# Patient Record
Sex: Female | Born: 1945
Health system: Southern US, Community
[De-identification: ages and names within clinical notes are randomized; demographics above are authoritative.]

## PROBLEM LIST (undated history)

## (undated) DIAGNOSIS — E785 Hyperlipidemia, unspecified: Secondary | ICD-10-CM

## (undated) DIAGNOSIS — H269 Unspecified cataract: Secondary | ICD-10-CM

## (undated) DIAGNOSIS — B029 Zoster without complications: Secondary | ICD-10-CM

## (undated) DIAGNOSIS — K219 Gastro-esophageal reflux disease without esophagitis: Secondary | ICD-10-CM

## (undated) DIAGNOSIS — C50919 Malignant neoplasm of unspecified site of unspecified female breast: Secondary | ICD-10-CM

## (undated) HISTORY — PX: WRIST FRACTURE SURGERY: SHX121

## (undated) HISTORY — DX: Unspecified cataract: H26.9

## (undated) HISTORY — PX: CATARACT EXTRACTION: SUR2

## (undated) HISTORY — PX: ABDOMINAL HYSTERECTOMY: SHX81

## (undated) HISTORY — DX: Hyperlipidemia, unspecified: E78.5

## (undated) HISTORY — DX: Malignant neoplasm of unspecified site of unspecified female breast: C50.919

## (undated) HISTORY — DX: Zoster without complications: B02.9

## (undated) HISTORY — DX: Gastro-esophageal reflux disease without esophagitis: K21.9

---

## 1979-07-24 HISTORY — PX: PARTIAL HYSTERECTOMY: SHX80

## 1999-09-09 ENCOUNTER — Other Ambulatory Visit: Admission: RE | Admit: 1999-09-09 | Discharge: 1999-09-09 | Payer: Self-pay | Admitting: Obstetrics and Gynecology

## 2000-12-06 ENCOUNTER — Other Ambulatory Visit: Admission: RE | Admit: 2000-12-06 | Discharge: 2000-12-06 | Payer: Self-pay | Admitting: Obstetrics and Gynecology

## 2002-01-03 ENCOUNTER — Other Ambulatory Visit: Admission: RE | Admit: 2002-01-03 | Discharge: 2002-01-03 | Payer: Self-pay | Admitting: Internal Medicine

## 2002-01-20 ENCOUNTER — Encounter: Payer: Self-pay | Admitting: Family Medicine

## 2002-01-20 LAB — CONVERTED CEMR LAB

## 2002-01-30 ENCOUNTER — Ambulatory Visit (HOSPITAL_COMMUNITY): Admission: RE | Admit: 2002-01-30 | Discharge: 2002-01-30 | Payer: Self-pay | Admitting: Obstetrics and Gynecology

## 2004-12-16 ENCOUNTER — Ambulatory Visit: Payer: Self-pay | Admitting: Family Medicine

## 2005-02-08 ENCOUNTER — Ambulatory Visit: Payer: Self-pay | Admitting: Internal Medicine

## 2005-04-26 ENCOUNTER — Ambulatory Visit: Payer: Self-pay | Admitting: Family Medicine

## 2005-05-11 ENCOUNTER — Ambulatory Visit: Payer: Self-pay | Admitting: Family Medicine

## 2005-06-08 ENCOUNTER — Ambulatory Visit: Payer: Self-pay | Admitting: Internal Medicine

## 2005-07-01 ENCOUNTER — Ambulatory Visit: Payer: Self-pay | Admitting: Family Medicine

## 2006-09-22 LAB — CONVERTED CEMR LAB: Pap Smear: NORMAL

## 2007-07-07 ENCOUNTER — Encounter: Payer: Self-pay | Admitting: Family Medicine

## 2007-07-07 DIAGNOSIS — E785 Hyperlipidemia, unspecified: Secondary | ICD-10-CM | POA: Insufficient documentation

## 2007-07-07 DIAGNOSIS — K219 Gastro-esophageal reflux disease without esophagitis: Secondary | ICD-10-CM | POA: Insufficient documentation

## 2007-07-07 DIAGNOSIS — K760 Fatty (change of) liver, not elsewhere classified: Secondary | ICD-10-CM | POA: Insufficient documentation

## 2007-07-07 DIAGNOSIS — M810 Age-related osteoporosis without current pathological fracture: Secondary | ICD-10-CM | POA: Insufficient documentation

## 2007-08-02 ENCOUNTER — Ambulatory Visit: Payer: Self-pay | Admitting: Family Medicine

## 2007-08-15 ENCOUNTER — Ambulatory Visit: Payer: Self-pay | Admitting: Family Medicine

## 2007-08-16 LAB — CONVERTED CEMR LAB
ALT: 26 units/L (ref 0–35)
AST: 21 units/L (ref 0–37)
Albumin: 4.2 g/dL (ref 3.5–5.2)
Alkaline Phosphatase: 61 units/L (ref 39–117)
BUN: 14 mg/dL (ref 6–23)
Basophils Absolute: 0 10*3/uL (ref 0.0–0.1)
Basophils Relative: 0.1 % (ref 0.0–1.0)
Bilirubin, Direct: 0.2 mg/dL (ref 0.0–0.3)
CO2: 29 meq/L (ref 19–32)
Calcium: 9.9 mg/dL (ref 8.4–10.5)
Chloride: 106 meq/L (ref 96–112)
Cholesterol: 269 mg/dL (ref 0–200)
Creatinine, Ser: 1 mg/dL (ref 0.4–1.2)
Direct LDL: 198.6 mg/dL
Eosinophils Absolute: 0.2 10*3/uL (ref 0.0–0.6)
Eosinophils Relative: 2.3 % (ref 0.0–5.0)
GFR calc Af Amer: 72 mL/min
GFR calc non Af Amer: 60 mL/min
Glucose, Bld: 96 mg/dL (ref 70–99)
HCT: 38.2 % (ref 36.0–46.0)
HDL: 50.7 mg/dL (ref >39.0)
Hemoglobin: 13 g/dL (ref 12.0–15.0)
Lymphocytes Relative: 32.8 % (ref 12.0–46.0)
MCHC: 34 g/dL (ref 30.0–36.0)
MCV: 84.5 fL (ref 78.0–100.0)
Monocytes Absolute: 0.6 10*3/uL (ref 0.2–0.7)
Monocytes Relative: 9 % (ref 3.0–11.0)
Neutro Abs: 3.8 10*3/uL (ref 1.4–7.7)
Neutrophils Relative %: 55.8 % (ref 43.0–77.0)
Platelets: 305 10*3/uL (ref 150–400)
Potassium: 5 meq/L (ref 3.5–5.1)
RBC: 4.52 M/uL (ref 3.87–5.11)
RDW: 12.6 % (ref 11.5–14.6)
Sodium: 141 meq/L (ref 135–145)
TSH: 2.98 microintl units/mL (ref 0.35–5.50)
Total Bilirubin: 0.8 mg/dL (ref 0.3–1.2)
Total CHOL/HDL Ratio: 5.3
Total Protein: 6.5 g/dL (ref 6.0–8.3)
Triglycerides: 156 mg/dL — ABNORMAL HIGH (ref 0–149)
VLDL: 31 mg/dL (ref 0–40)
WBC: 6.9 10*3/uL (ref 4.5–10.5)

## 2007-08-28 ENCOUNTER — Ambulatory Visit: Payer: Self-pay | Admitting: Family Medicine

## 2007-08-29 ENCOUNTER — Encounter (INDEPENDENT_AMBULATORY_CARE_PROVIDER_SITE_OTHER): Payer: Self-pay | Admitting: *Deleted

## 2007-09-26 ENCOUNTER — Ambulatory Visit: Payer: Self-pay | Admitting: Family Medicine

## 2007-09-27 LAB — CONVERTED CEMR LAB
ALT: 28 units/L (ref 0–35)
AST: 24 units/L (ref 0–37)
Cholesterol: 193 mg/dL (ref 0–200)
HDL: 42.7 mg/dL (ref >39.0)
LDL Cholesterol: 120 mg/dL — ABNORMAL HIGH (ref 0–99)
Total CHOL/HDL Ratio: 4.5
Triglycerides: 151 mg/dL — ABNORMAL HIGH (ref 0–149)
VLDL: 30 mg/dL (ref 0–40)

## 2008-07-19 ENCOUNTER — Encounter: Payer: Self-pay | Admitting: Family Medicine

## 2008-07-23 ENCOUNTER — Encounter (INDEPENDENT_AMBULATORY_CARE_PROVIDER_SITE_OTHER): Payer: Self-pay | Admitting: *Deleted

## 2008-10-02 ENCOUNTER — Ambulatory Visit: Payer: Self-pay | Admitting: Family Medicine

## 2008-10-03 LAB — CONVERTED CEMR LAB
ALT: 27 units/L (ref 0–35)
AST: 28 units/L (ref 0–37)
Albumin: 3.7 g/dL (ref 3.5–5.2)
Alkaline Phosphatase: 38 units/L — ABNORMAL LOW (ref 39–117)
BUN: 10 mg/dL (ref 6–23)
Basophils Absolute: 0 10*3/uL (ref 0.0–0.1)
Basophils Relative: 0.1 % (ref 0.0–3.0)
Bilirubin, Direct: 0.1 mg/dL (ref 0.0–0.3)
CO2: 27 meq/L (ref 19–32)
Calcium: 8.8 mg/dL (ref 8.4–10.5)
Chloride: 109 meq/L (ref 96–112)
Cholesterol: 173 mg/dL (ref 0–200)
Creatinine, Ser: 1.1 mg/dL (ref 0.4–1.2)
Eosinophils Absolute: 0.1 10*3/uL (ref 0.0–0.7)
Eosinophils Relative: 1.5 % (ref 0.0–5.0)
GFR calc Af Amer: 64 mL/min
GFR calc non Af Amer: 53 mL/min
Glucose, Bld: 100 mg/dL — ABNORMAL HIGH (ref 70–99)
HCT: 33 % — ABNORMAL LOW (ref 36.0–46.0)
HDL: 42.3 mg/dL (ref >39.0)
Hemoglobin: 11.5 g/dL — ABNORMAL LOW (ref 12.0–15.0)
LDL Cholesterol: 107 mg/dL — ABNORMAL HIGH (ref 0–99)
Lymphocytes Relative: 36.8 % (ref 12.0–46.0)
MCHC: 34.9 g/dL (ref 30.0–36.0)
MCV: 84.6 fL (ref 78.0–100.0)
Monocytes Absolute: 0.5 10*3/uL (ref 0.1–1.0)
Monocytes Relative: 9.4 % (ref 3.0–12.0)
Neutro Abs: 2.7 10*3/uL (ref 1.4–7.7)
Neutrophils Relative %: 52.2 % (ref 43.0–77.0)
Platelets: 287 10*3/uL (ref 150–400)
Potassium: 4.6 meq/L (ref 3.5–5.1)
RBC: 3.9 M/uL (ref 3.87–5.11)
RDW: 12.6 % (ref 11.5–14.6)
Sodium: 142 meq/L (ref 135–145)
TSH: 2.68 microintl units/mL (ref 0.35–5.50)
Total Bilirubin: 0.7 mg/dL (ref 0.3–1.2)
Total CHOL/HDL Ratio: 4.1
Total Protein: 6 g/dL (ref 6.0–8.3)
Triglycerides: 121 mg/dL (ref 0–149)
VLDL: 24 mg/dL (ref 0–40)
WBC: 5.3 10*3/uL (ref 4.5–10.5)

## 2008-10-04 LAB — CONVERTED CEMR LAB: Vit D, 1,25-Dihydroxy: 19 — ABNORMAL LOW (ref 30–89)

## 2008-10-11 ENCOUNTER — Ambulatory Visit: Payer: Self-pay | Admitting: Family Medicine

## 2008-10-11 DIAGNOSIS — E559 Vitamin D deficiency, unspecified: Secondary | ICD-10-CM | POA: Insufficient documentation

## 2008-10-22 LAB — CONVERTED CEMR LAB: Pap Smear: NORMAL

## 2008-12-02 ENCOUNTER — Ambulatory Visit: Payer: Self-pay | Admitting: Family Medicine

## 2008-12-04 LAB — CONVERTED CEMR LAB: Vit D, 1,25-Dihydroxy: 40 (ref 30–89)

## 2009-07-30 ENCOUNTER — Encounter: Payer: Self-pay | Admitting: Family Medicine

## 2009-07-30 LAB — HM COLONOSCOPY: HM Colonoscopy: NORMAL

## 2009-08-20 ENCOUNTER — Encounter: Payer: Self-pay | Admitting: Family Medicine

## 2009-08-25 ENCOUNTER — Encounter: Payer: Self-pay | Admitting: Family Medicine

## 2009-08-29 ENCOUNTER — Encounter: Payer: Self-pay | Admitting: Family Medicine

## 2009-09-01 ENCOUNTER — Encounter (INDEPENDENT_AMBULATORY_CARE_PROVIDER_SITE_OTHER): Payer: Self-pay | Admitting: *Deleted

## 2009-09-22 ENCOUNTER — Ambulatory Visit: Payer: Self-pay | Admitting: Internal Medicine

## 2009-09-22 ENCOUNTER — Encounter: Payer: Self-pay | Admitting: Family Medicine

## 2009-10-13 ENCOUNTER — Ambulatory Visit: Payer: Self-pay | Admitting: Family Medicine

## 2009-10-14 ENCOUNTER — Encounter (INDEPENDENT_AMBULATORY_CARE_PROVIDER_SITE_OTHER): Payer: Self-pay | Admitting: *Deleted

## 2009-10-14 LAB — CONVERTED CEMR LAB: Vit D, 25-Hydroxy: 16 ng/mL — ABNORMAL LOW (ref 30–89)

## 2009-10-22 LAB — CONVERTED CEMR LAB: Pap Smear: NORMAL

## 2009-10-31 ENCOUNTER — Telehealth: Payer: Self-pay | Admitting: Family Medicine

## 2009-10-31 LAB — CONVERTED CEMR LAB
ALT: 25 units/L (ref 0–35)
AST: 23 units/L (ref 0–37)
Albumin: 4 g/dL (ref 3.5–5.2)
Alkaline Phosphatase: 43 units/L (ref 39–117)
Bilirubin, Direct: 0 mg/dL (ref 0.0–0.3)
Cholesterol: 173 mg/dL (ref 0–200)
HDL: 56.8 mg/dL (ref >39.00)
LDL Cholesterol: 99 mg/dL (ref 0–99)
TSH: 3.43 microintl units/mL (ref 0.35–5.50)
Total Bilirubin: 0.9 mg/dL (ref 0.3–1.2)
Total CHOL/HDL Ratio: 3
Total Protein: 6.7 g/dL (ref 6.0–8.3)
Triglycerides: 84 mg/dL (ref 0.0–149.0)
VLDL: 16.8 mg/dL (ref 0.0–40.0)

## 2010-01-06 ENCOUNTER — Telehealth: Payer: Self-pay | Admitting: Family Medicine

## 2010-01-07 ENCOUNTER — Ambulatory Visit: Payer: Self-pay | Admitting: Family Medicine

## 2010-01-09 LAB — CONVERTED CEMR LAB: Vit D, 25-Hydroxy: 35 ng/mL (ref 30–89)

## 2010-04-10 ENCOUNTER — Encounter (INDEPENDENT_AMBULATORY_CARE_PROVIDER_SITE_OTHER): Payer: Self-pay | Admitting: Obstetrics and Gynecology

## 2010-04-10 ENCOUNTER — Ambulatory Visit (HOSPITAL_COMMUNITY): Admission: RE | Admit: 2010-04-10 | Discharge: 2010-04-11 | Payer: Self-pay | Admitting: Obstetrics and Gynecology

## 2010-04-27 ENCOUNTER — Ambulatory Visit (HOSPITAL_BASED_OUTPATIENT_CLINIC_OR_DEPARTMENT_OTHER): Admission: RE | Admit: 2010-04-27 | Discharge: 2010-04-27 | Payer: Self-pay | Admitting: Urology

## 2010-08-19 ENCOUNTER — Ambulatory Visit: Payer: Self-pay | Admitting: Family Medicine

## 2010-08-21 ENCOUNTER — Encounter: Payer: Self-pay | Admitting: Family Medicine

## 2010-08-22 DIAGNOSIS — C50919 Malignant neoplasm of unspecified site of unspecified female breast: Secondary | ICD-10-CM

## 2010-08-22 HISTORY — DX: Malignant neoplasm of unspecified site of unspecified female breast: C50.919

## 2010-08-22 HISTORY — PX: BREAST BIOPSY: SHX20

## 2010-08-25 ENCOUNTER — Encounter: Payer: Self-pay | Admitting: Family Medicine

## 2010-08-26 ENCOUNTER — Encounter: Payer: Self-pay | Admitting: Family Medicine

## 2010-09-08 ENCOUNTER — Encounter: Payer: Self-pay | Admitting: Family Medicine

## 2010-09-09 ENCOUNTER — Encounter: Payer: Self-pay | Admitting: Family Medicine

## 2010-09-15 ENCOUNTER — Encounter: Admission: RE | Admit: 2010-09-15 | Discharge: 2010-09-15 | Payer: Self-pay | Admitting: Radiology

## 2010-09-17 ENCOUNTER — Encounter: Payer: Self-pay | Admitting: Family Medicine

## 2010-09-22 HISTORY — PX: BREAST LUMPECTOMY: SHX2

## 2010-09-23 ENCOUNTER — Encounter: Admission: RE | Admit: 2010-09-23 | Discharge: 2010-09-23 | Payer: Self-pay | Admitting: Radiology

## 2010-09-24 ENCOUNTER — Encounter: Payer: Self-pay | Admitting: Family Medicine

## 2010-10-02 ENCOUNTER — Encounter: Admission: RE | Admit: 2010-10-02 | Discharge: 2010-10-02 | Payer: Self-pay | Admitting: General Surgery

## 2010-10-07 ENCOUNTER — Ambulatory Visit
Admission: RE | Admit: 2010-10-07 | Discharge: 2010-10-08 | Payer: Self-pay | Source: Home / Self Care | Admitting: General Surgery

## 2010-10-19 ENCOUNTER — Ambulatory Visit: Payer: Self-pay | Admitting: Oncology

## 2010-10-19 ENCOUNTER — Encounter: Payer: Self-pay | Admitting: Family Medicine

## 2010-10-21 ENCOUNTER — Ambulatory Visit: Payer: Self-pay | Admitting: Family Medicine

## 2010-10-21 ENCOUNTER — Telehealth: Payer: Self-pay | Admitting: Family Medicine

## 2010-10-21 DIAGNOSIS — C50919 Malignant neoplasm of unspecified site of unspecified female breast: Secondary | ICD-10-CM | POA: Insufficient documentation

## 2010-10-22 ENCOUNTER — Encounter: Payer: Self-pay | Admitting: Family Medicine

## 2010-10-26 LAB — CONVERTED CEMR LAB
ALT: 24 units/L (ref 0–35)
AST: 23 units/L (ref 0–37)
Albumin: 4.2 g/dL (ref 3.5–5.2)
Alkaline Phosphatase: 58 units/L (ref 39–117)
Basophils Absolute: 0 10*3/uL (ref 0.0–0.1)
Basophils Relative: 0.6 % (ref 0.0–3.0)
Bilirubin, Direct: 0.1 mg/dL (ref 0.0–0.3)
Cholesterol: 238 mg/dL — ABNORMAL HIGH (ref 0–200)
Direct LDL: 161.7 mg/dL
Eosinophils Absolute: 0 10*3/uL (ref 0.0–0.7)
Eosinophils Relative: 0.6 % (ref 0.0–5.0)
HCT: 33.7 % — ABNORMAL LOW (ref 36.0–46.0)
HDL: 55.4 mg/dL (ref >39.00)
Hemoglobin: 11.1 g/dL — ABNORMAL LOW (ref 12.0–15.0)
Lymphocytes Relative: 26 % (ref 12.0–46.0)
Lymphs Abs: 2.1 10*3/uL (ref 0.7–4.0)
MCHC: 33 g/dL (ref 30.0–36.0)
MCV: 74.1 fL — ABNORMAL LOW (ref 78.0–100.0)
Monocytes Absolute: 0.7 10*3/uL (ref 0.1–1.0)
Monocytes Relative: 8.1 % (ref 3.0–12.0)
Neutro Abs: 5.3 10*3/uL (ref 1.4–7.7)
Neutrophils Relative %: 64.7 % (ref 43.0–77.0)
Platelets: 328 10*3/uL (ref 150.0–400.0)
RBC: 4.55 M/uL (ref 3.87–5.11)
RDW: 17.6 % — ABNORMAL HIGH (ref 11.5–14.6)
TSH: 2.18 microintl units/mL (ref 0.35–5.50)
Total Bilirubin: 0.4 mg/dL (ref 0.3–1.2)
Total CHOL/HDL Ratio: 4
Total Protein: 6.8 g/dL (ref 6.0–8.3)
Triglycerides: 113 mg/dL (ref 0.0–149.0)
VLDL: 22.6 mg/dL (ref 0.0–40.0)
Vit D, 25-Hydroxy: 36 ng/mL (ref 30–89)
WBC: 8.2 10*3/uL (ref 4.5–10.5)

## 2010-10-27 ENCOUNTER — Ambulatory Visit
Admission: RE | Admit: 2010-10-27 | Discharge: 2010-10-27 | Payer: Self-pay | Source: Home / Self Care | Attending: General Surgery | Admitting: General Surgery

## 2010-11-10 ENCOUNTER — Encounter: Payer: Self-pay | Admitting: Family Medicine

## 2010-11-22 HISTORY — PX: MASTECTOMY, RADICAL: SHX710

## 2010-11-22 HISTORY — PX: MASTECTOMY: SHX3

## 2010-11-26 LAB — DIFFERENTIAL
Basophils Absolute: 0 10*3/uL (ref 0.0–0.1)
Basophils Relative: 0 % (ref 0–1)
Eosinophils Absolute: 0.1 10*3/uL (ref 0.0–0.7)
Eosinophils Relative: 1 % (ref 0–5)
Lymphocytes Relative: 30 % (ref 12–46)
Lymphs Abs: 1.9 10*3/uL (ref 0.7–4.0)
Monocytes Absolute: 0.7 10*3/uL (ref 0.1–1.0)
Monocytes Relative: 10 % (ref 3–12)
Neutro Abs: 3.8 10*3/uL (ref 1.7–7.7)
Neutrophils Relative %: 58 % (ref 43–77)

## 2010-11-26 LAB — BASIC METABOLIC PANEL
BUN: 13 mg/dL (ref 6–23)
CO2: 30 mEq/L (ref 19–32)
Calcium: 9.5 mg/dL (ref 8.4–10.5)
Chloride: 105 mEq/L (ref 96–112)
Creatinine, Ser: 1.09 mg/dL (ref 0.4–1.2)
GFR calc Af Amer: >60 mL/min (ref >60)
GFR calc non Af Amer: 50 mL/min — ABNORMAL LOW (ref >60)
Glucose, Bld: 100 mg/dL — ABNORMAL HIGH (ref 70–99)
Potassium: 4 mEq/L (ref 3.5–5.1)
Sodium: 142 mEq/L (ref 135–145)

## 2010-11-26 LAB — CBC
HCT: 40 % (ref 36.0–46.0)
Hemoglobin: 12.6 g/dL (ref 12.0–15.0)
MCH: 25.1 pg — ABNORMAL LOW (ref 26.0–34.0)
MCHC: 31.5 g/dL (ref 30.0–36.0)
MCV: 79.8 fL (ref 78.0–100.0)
Platelets: 304 10*3/uL (ref 150–400)
RBC: 5.01 MIL/uL (ref 3.87–5.11)
RDW: 17.3 % — ABNORMAL HIGH (ref 11.5–15.5)
WBC: 6.5 10*3/uL (ref 4.0–10.5)

## 2010-11-30 ENCOUNTER — Ambulatory Visit
Admission: RE | Admit: 2010-11-30 | Discharge: 2010-11-30 | Payer: Self-pay | Source: Home / Self Care | Attending: Family Medicine | Admitting: Family Medicine

## 2010-11-30 ENCOUNTER — Other Ambulatory Visit: Payer: Self-pay | Admitting: Family Medicine

## 2010-11-30 DIAGNOSIS — D649 Anemia, unspecified: Secondary | ICD-10-CM | POA: Insufficient documentation

## 2010-11-30 LAB — CBC WITH DIFFERENTIAL/PLATELET
Basophils Absolute: 0 10*3/uL (ref 0.0–0.1)
Basophils Relative: 0.3 % (ref 0.0–3.0)
Eosinophils Absolute: 0.1 10*3/uL (ref 0.0–0.7)
Eosinophils Relative: 0.9 % (ref 0.0–5.0)
HCT: 37 % (ref 36.0–46.0)
Hemoglobin: 12.1 g/dL (ref 12.0–15.0)
Lymphocytes Relative: 31.5 % (ref 12.0–46.0)
Lymphs Abs: 2 10*3/uL (ref 0.7–4.0)
MCHC: 32.8 g/dL (ref 30.0–36.0)
MCV: 78.8 fl (ref 78.0–100.0)
Monocytes Absolute: 0.6 10*3/uL (ref 0.1–1.0)
Monocytes Relative: 9.4 % (ref 3.0–12.0)
Neutro Abs: 3.7 10*3/uL (ref 1.4–7.7)
Neutrophils Relative %: 57.9 % (ref 43.0–77.0)
Platelets: 280 10*3/uL (ref 150.0–400.0)
RBC: 4.69 Mil/uL (ref 3.87–5.11)
RDW: 21.1 % — ABNORMAL HIGH (ref 11.5–14.6)
WBC: 6.4 10*3/uL (ref 4.5–10.5)

## 2010-11-30 LAB — IRON: Iron: 39 ug/dL — ABNORMAL LOW (ref 42–145)

## 2010-11-30 LAB — LIPID PANEL
Cholesterol: 201 mg/dL — ABNORMAL HIGH (ref 0–200)
HDL: 52.3 mg/dL (ref >39.00)
Total CHOL/HDL Ratio: 4
Triglycerides: 101 mg/dL (ref 0.0–149.0)
VLDL: 20.2 mg/dL (ref 0.0–40.0)

## 2010-11-30 LAB — LDL CHOLESTEROL, DIRECT: Direct LDL: 139.7 mg/dL

## 2010-11-30 LAB — AST: AST: 27 U/L (ref 0–37)

## 2010-11-30 LAB — ALT: ALT: 25 U/L (ref 0–35)

## 2010-12-02 ENCOUNTER — Encounter (INDEPENDENT_AMBULATORY_CARE_PROVIDER_SITE_OTHER): Payer: Self-pay | Admitting: General Surgery

## 2010-12-02 ENCOUNTER — Inpatient Hospital Stay (HOSPITAL_COMMUNITY)
Admission: RE | Admit: 2010-12-02 | Discharge: 2010-12-05 | Payer: Self-pay | Source: Home / Self Care | Attending: General Surgery | Admitting: General Surgery

## 2010-12-07 LAB — CBC
HCT: 28.6 % — ABNORMAL LOW (ref 36.0–46.0)
Hemoglobin: 9 g/dL — ABNORMAL LOW (ref 12.0–15.0)
MCH: 25.4 pg — ABNORMAL LOW (ref 26.0–34.0)
MCHC: 31.5 g/dL (ref 30.0–36.0)
MCV: 80.6 fL (ref 78.0–100.0)
Platelets: 239 10*3/uL (ref 150–400)
RBC: 3.55 MIL/uL — ABNORMAL LOW (ref 3.87–5.11)
RDW: 17.5 % — ABNORMAL HIGH (ref 11.5–15.5)
WBC: 11.4 10*3/uL — ABNORMAL HIGH (ref 4.0–10.5)

## 2010-12-07 LAB — BASIC METABOLIC PANEL
BUN: 9 mg/dL (ref 6–23)
CO2: 27 mEq/L (ref 19–32)
Calcium: 8.5 mg/dL (ref 8.4–10.5)
Chloride: 110 mEq/L (ref 96–112)
Creatinine, Ser: 0.92 mg/dL (ref 0.4–1.2)
GFR calc Af Amer: >60 mL/min (ref >60)
GFR calc non Af Amer: >60 mL/min (ref >60)
Glucose, Bld: 118 mg/dL — ABNORMAL HIGH (ref 70–99)
Potassium: 4.4 mEq/L (ref 3.5–5.1)
Sodium: 142 mEq/L (ref 135–145)

## 2010-12-10 ENCOUNTER — Encounter: Payer: Self-pay | Admitting: Family Medicine

## 2010-12-17 LAB — SURGICAL PCR SCREEN
MRSA, PCR: NEGATIVE
Staphylococcus aureus: NEGATIVE

## 2010-12-21 NOTE — Discharge Summary (Signed)
  Brianna [name], Brianna [name]               ACCOUNT NO.:  [id number]  MEDICAL RECORD NO.:  [id number]          PATIENT TYPE:  INP  LOCATION:  1538                         FACILITY:  [facility]  PHYSICIAN:  Juanetta Gosling, MDDATE OF BIRTH:  02/13/1946  DATE OF ADMISSION:  12/02/2010 DATE OF DISCHARGE:  12/05/2010                              DISCHARGE SUMMARY   ADMISSION DIAGNOSES: 1. Hypercholesterolemia. 2. Gastroesophageal reflux disease. 3. Breast cancer.  DISCHARGE DIAGNOSES: 1. Hypercholesterolemia. 2. Gastroesophageal reflux disease. 3. Breast cancer.  PROCEDURES PERFORMED:  On December 05, 2010, bilateral simple mastectomy.  CONSULTS:  Physical Therapy.  HISTORY AND HOSPITAL COURSE:  Brianna [name] is a 64-year female who I initially saw in October of 2011 with a newly diagnosed right breast cancer.  I took her to the operating room October 07, 2010.  She underwent breast conservation therapy with a sentinel node biopsy at that point.  Her pathology showed lobular carcinoma measuring 2.3 cm present in a couple of margins.  I took her back to the operating room, and a couple of her margins still were very close in these areas that I did not think were going to be positive.  I had a very big concern about my local control with this, and I recommended that she undergo a mastectomy on that side.  She also desired to undergo a mastectomy on the other side due to the fact that she had an invasive lobular carcinoma as well as a history of a complex sclerosing lesion that I had excised on that side.  She and I discussed bilateral simple mastectomies.  She underwent this procedure on December 05, 2010.  Postoperatively, she was begun on p.o. pain medicines and had her diet advanced as tolerated. Her JPs put out the expected serous material.  I had Physical Therapy see her while she was here.  She went home on postoperative day #3 once her pain was controlled, tolerating a regular  diet, and she was comfortable with her drains.  Her pathology report showed on the left a biopsy stat reaction with fibrosis inflammation, usual ductal hyperplasia without atypia.  The right side showed no evidence of any residual malignancy.  On her pertinent laboratory evaluation, her hematocrit was 40 upon admission and was 28.6 on discharge.  Her BUN and creatinine were 13 and 1.09.  DISCHARGE MEDICATIONS:  Oxycodone 1-2 tablets every 4 hours as needed, Allegra daily, calcium carbonate, Fosamax, iron, omeprazole 20 mg q.a.m., Pravachol 80 mg q.h.s. and vitamin D3.  DISCHARGE INSTRUCTIONS:  Return to see Dr. Dwain Sarna within a week, and I will change her dressing at that time.  My nurse is going to call her with the appointment time.     Juanetta Gosling, MD     MCW/MEDQ  D:  12/15/2010  T:  12/15/2010  Job:  [id number]  cc:   Brianna A. Tower, MD [address] [place], [place] [address]  Electronically Signed by Emelia Loron MD on 12/21/2010 11:29:12 AM

## 2010-12-23 ENCOUNTER — Encounter: Payer: Self-pay | Admitting: Family Medicine

## 2010-12-23 ENCOUNTER — Encounter (HOSPITAL_BASED_OUTPATIENT_CLINIC_OR_DEPARTMENT_OTHER): Payer: Medicare Other | Admitting: Oncology

## 2010-12-23 DIAGNOSIS — C50519 Malignant neoplasm of lower-outer quadrant of unspecified female breast: Secondary | ICD-10-CM

## 2010-12-23 DIAGNOSIS — C50919 Malignant neoplasm of unspecified site of unspecified female breast: Secondary | ICD-10-CM

## 2010-12-23 DIAGNOSIS — K219 Gastro-esophageal reflux disease without esophagitis: Secondary | ICD-10-CM

## 2010-12-23 DIAGNOSIS — E7800 Pure hypercholesterolemia, unspecified: Secondary | ICD-10-CM

## 2010-12-24 NOTE — Consult Note (Signed)
Summary: [facility]   Imported By: Lanelle Bal 10/07/2010 13:08:19  _____________________________________________________________________  External Attachment:    Type:   Image     Comment:   External Document

## 2010-12-24 NOTE — Letter (Signed)
Summary: [facility]  [facility]   Imported By: Lanelle Bal 11/11/2010 10:48:17  _____________________________________________________________________  External Attachment:    Type:   Image     Comment:   External Document

## 2010-12-24 NOTE — Consult Note (Signed)
Summary: Dr.Rick Cornella,[facility]  Dr.Rick Cornella,[facility]   Imported By: Beau Fanny 09/10/2010 10:18:30  _____________________________________________________________________  External Attachment:    Type:   Image     Comment:   External Document

## 2010-12-24 NOTE — Letter (Signed)
Summary: [facility]   Imported By: Maryln Gottron 11/25/2010 10:29:38  _____________________________________________________________________  External Attachment:    Type:   Image     Comment:   External Document

## 2010-12-24 NOTE — Progress Notes (Signed)
Summary: needs to change from pantoprazole  Phone Note Call from Patient Call back at Home Phone [phone]   Caller: Patient Call For: Judith Part MD Summary of Call: Pt's insurance will no longer cover pantoprazole, they prefer omeprazole.  Pt is asking if omeprazole [facility] be [facility]. Initial call taken by: Lowella Petties CMA,  January 06, 2010 1:00 PM  Follow-up for Phone Call        that is fine - update me if it does not work Follow-up by: Judith Part MD,  January 06, 2010 1:43 PM  Additional Follow-up for Phone Call Additional follow up Details #1::        Patient Advised.   Med sent electronically. Additional Follow-up by: Delilah Shan CMA (AAMA),  January 06, 2010 3:09 PM    New/Updated Medications: PRILOSEC 20 MG CPDR (OMEPRAZOLE) 1 by mouth once daily in am Prescriptions: PRILOSEC 20 MG CPDR (OMEPRAZOLE) 1 by mouth once daily in am  #30 x 11   Entered by:   Delilah Shan CMA (AAMA)   Authorized by:   Judith Part MD   Signed by:   Delilah Shan CMA (AAMA) on 01/06/2010   Method used:   [facility]        [facility] [facility]       [address]       [place], [place]  [address]       Ph: [phone] or [phone]       Fax: [phone]   RxID:   [id number]

## 2010-12-24 NOTE — Progress Notes (Signed)
Summary: Continuing Fosamax  Phone Note Other Incoming   Summary of Call: please let pt know I reviewed her chart and we actually started her fosamax 9/08-- so 5 years would be 9/13 I do not want her to stop it yet  is possible , however that her oncologist may change it anyway please call in med with refils for the year  Initial call taken by: Judith Part MD,  October 21, 2010 1:47 PM  Follow-up for Phone Call        Patient notified as instructed by telephone. [facility] as instructed. Lewanda Rife LPN  October 21, 2010 5:36 PM     Prescriptions: FOSAMAX 70 MG  TABS (ALENDRONATE SODIUM) 1 by mouth once weekly  #4 Tablet x 11   Entered by:   Lewanda Rife LPN   Authorized by:   Judith Part MD   Signed by:   Lewanda Rife LPN on 04/54/0981   Method used:   Telephoned to ...       [facility]  [id number]       [address]       [place], [place]  [address]       Ph: [phone] or [phone]       Fax: [phone]   RxID:   [id number]    Past Medical History:    GERD    Hyperlipidemia    Osteoporosis  (fosamax started 9/08)    vit D deficiency    shingles     allergic rhinitis     10/11 breast carcinoma R             GI- Dr Troy Sine    gyn- [facility] Mantle

## 2010-12-24 NOTE — Assessment & Plan Note (Signed)
Summary: CPX NO PAP SMEAR / LFW   Vital Signs:  Patient profile:   65 year old female Height:      61 inches Weight:      143.75 pounds BMI:     27.26 Temp:     98.1 degrees F oral Pulse rate:   80 / minute Pulse rhythm:   regular BP sitting:   124 / 76  (left arm) Cuff size:   regular  Vitals Entered By: Lewanda Rife LPN (October 21, 2010 9:41 AM) CC: CPX does not want pap smear   History of Present Illness: here for check up of chronic med problems  is feeling ok overall   has just been dx with breast ca with lumpectomy R breast -- has to go back in and have additional surgery  will be a small surgery  will have oncol f/u today   wt is down 3 lb  bp good at 124/76  due for lipid check does watch diet somewhat   on fosamax for osteopenia --4 -5 years  dexa was 11/10 D level due- low in past calcium and vit D -- not always compliant   colonosc 9/10  pap 12/09 she does go to gyn every year -- last was dec of 2010 -- had pap at that time and it was normal  does not need breast exam today-- lots of exams and recent surgery   Td10 flu utd  pneumovax-- wants to get today  shingles status -- would like to have     Allergies (verified): No Known Drug Allergies  Family History: Father: esophageal cancer Mother: dementia Siblings: 4 brothers brother DM DM on mother's side P aunt with ovarian cancer  Review of Systems General:  Denies fatigue, loss of appetite, and malaise. Eyes:  Denies blurring and eye irritation. CV:  Denies chest pain or discomfort, palpitations, and shortness of breath with exertion. Resp:  Denies cough, shortness of breath, and wheezing. GI:  Denies abdominal pain, change in bowel habits, and indigestion. GU:  Denies abnormal vaginal bleeding, discharge, and dysuria. MS:  Denies joint redness, joint swelling, and cramps. Derm:  Denies itching and rash. Neuro:  Denies headaches, numbness, and tingling. Psych:  Denies anxiety and  depression. Endo:  Denies cold intolerance, excessive thirst, excessive urination, and heat intolerance. Heme:  Denies abnormal bruising and bleeding.  Physical Exam  General:  Well-developed,well-nourished,in no acute distress; alert,appropriate and cooperative throughout examination Head:  normocephalic, atraumatic, and no abnormalities observed.   Eyes:  vision grossly intact, pupils equal, pupils round, and pupils reactive to light.  no conjunctival pallor, injection or icterus  Ears:  R ear normal and L ear normal.   Nose:  no nasal discharge.   Mouth:  pharynx pink and moist.   Neck:  supple with full rom and no masses or thyromegally, no JVD or carotid bruit  Chest Wall:  No deformities, masses, or tenderness noted. Lungs:  Normal respiratory effort, chest expands symmetrically. Lungs are clear to auscultation, no crackles or wheezes. Heart:  Normal rate and regular rhythm. S1 and S2 normal without gallop, murmur, click, rub or other extra sounds. Abdomen:  Bowel sounds positive,abdomen soft and non-tender without masses, organomegaly or hernias noted. no renal bruits  Msk:  No deformity or scoliosis noted of thoracic or lumbar spine.  no acute joint changes  Pulses:  R and L carotid,radial,femoral,dorsalis pedis and posterior tibial pulses are full and equal bilaterally Extremities:  No clubbing, cyanosis, edema, or deformity  noted with normal full range of motion of all joints.   Neurologic:  sensation intact to light touch, gait normal, and DTRs symmetrical and normal.   Skin:  Intact without suspicious lesions or rashes Cervical Nodes:  No lymphadenopathy noted Inguinal Nodes:  No significant adenopathy Psych:  normal affect, talkative and pleasant    Impression & Recommendations:  Problem # 1:  VITAMIN D DEFICIENCY (ICD-268.9) Assessment Unchanged check level  has not been compliant with dosing so may be low disc 2000 international units daily with ca   Orders: Venipuncture (31517) TLB-Lipid Panel (80061-LIPID) TLB-Hepatic/Liver Function Pnl (80076-HEPATIC) TLB-TSH (Thyroid Stimulating Hormone) (84443-TSH) T-Vitamin D (25-Hydroxy) (61607-37106) TLB-CBC Platelet - w/Differential (85025-CBCD) Specimen Handling (26948) Prescription Created Electronically [phone])  Problem # 2:  FATTY LIVER DISEASE, HX OF (ICD-V12.79) Assessment: Unchanged with wt loss 3 lb lab today  Orders: Venipuncture (03500) TLB-Lipid Panel (80061-LIPID) TLB-Hepatic/Liver Function Pnl (80076-HEPATIC) TLB-TSH (Thyroid Stimulating Hormone) (84443-TSH) T-Vitamin D (25-Hydroxy) (93818-29937) TLB-CBC Platelet - w/Differential (85025-CBCD) Prescription Created Electronically [phone])  Problem # 3:  HYPERLIPIDEMIA (ICD-272.4) Assessment: Unchanged  lab today on pravachol and advise rev low sat fat diet  Her updated medication list for this problem includes:    Pravachol 40 Mg Tabs (Pravastatin sodium) .Marland Kitchen... 1 by mouth q evening with a low fat snack  Orders: Venipuncture (89381) TLB-Lipid Panel (80061-LIPID) TLB-Hepatic/Liver Function Pnl (80076-HEPATIC) TLB-TSH (Thyroid Stimulating Hormone) (84443-TSH) T-Vitamin D (25-Hydroxy) (01751-02585) TLB-CBC Platelet - w/Differential (85025-CBCD) Prescription Created Electronically [id number])  Labs Reviewed: SGOT: 23 (10/13/2009)   SGPT: 25 (10/13/2009)   HDL:56.80 (10/13/2009), 42.3 (10/02/2008)  LDL:99 (10/13/2009), 107 (42/35/3614)  Chol:173 (10/13/2009), 173 (10/02/2008)  Trig:84.0 (10/13/2009), 121 (10/02/2008)  Problem # 4:  OSTEOPOROSIS (ICD-733.00) Assessment: Unchanged on fosamax -- ? 5 y -- will check old chart and adv  may have to be on oncol med for breast ca that deplete bone up to date on dexa  The following medications were removed from the medication list:    Vitamin D (ergocalciferol) 50000 Unit Caps (Ergocalciferol) .Marland Kitchen... 1 by mouth once weekly for 12 weeks Her updated medication list for this  problem includes:    Calcium-vitamin D 600-200 Mg-unit Tabs (Calcium-vitamin d) .Marland Kitchen... Take two by mouth qd    Fosamax 70 Mg Tabs (Alendronate sodium) .Marland Kitchen... 1 by mouth once weekly    Vitamin D 2000 Unit Tabs (Cholecalciferol) .Marland Kitchen... Take 1 tablet by mouth once a day  Orders: Venipuncture (43154) TLB-Lipid Panel (80061-LIPID) TLB-Hepatic/Liver Function Pnl (80076-HEPATIC) TLB-TSH (Thyroid Stimulating Hormone) (84443-TSH) T-Vitamin D (25-Hydroxy) (00867-61950) TLB-CBC Platelet - w/Differential (85025-CBCD) Prescription Created Electronically [phone])  Problem # 5:  BREAST CANCER (ICD-174.9)  for addnl surgery soon and oncol f/u good prognosis   Orders: Prescription Created Electronically [phone])  Complete Medication List: 1)  Calcium-vitamin D 600-200 Mg-unit Tabs (Calcium-vitamin d) .... Take two by mouth qd 2)  Fosamax 70 Mg Tabs (Alendronate sodium) .Marland Kitchen.. 1 by mouth once weekly 3)  Pravachol 40 Mg Tabs (Pravastatin sodium) .Marland Kitchen.. 1 by mouth q evening with a low fat snack 4)  Vitamin D 2000 Unit Tabs (Cholecalciferol) .... Take 1 tablet by mouth once a day 5)  Prilosec 20 Mg Cpdr (Omeprazole) .Marland Kitchen.. 1 by mouth once daily in am 6)  Allegra Allergy 180 Mg Tabs (Fexofenadine hcl) .... Otc as directed.  Other Orders: Pneumococcal Vaccine (09983) Admin 1st Vaccine (38250)  Patient Instructions: 1)  please pull pt's chart and put in my in box  2)  I will advise you further about fosamax 3)  labs today  4)  the current recommendation for calcium intake is 1200-1500 mg daily with 2000 IU of vitamin D (this may require a ca plus D and also extra D)  5)  call your insurance about coverage of shingles vaccine -- think about summer to schedule when all is taken care of with breast cancer treatment 6)  pneumonia vaccine today Prescriptions: PRILOSEC 20 MG CPDR (OMEPRAZOLE) 1 by mouth once daily in am  #30 x 11   Entered and Authorized by:   Judith Part MD   Signed by:   Judith Part MD  on 10/21/2010   Method used:   [facility]        [facility] (retail)       [address]       [place], [place]  [address]       Ph: [phone] or [phone]       Fax: [phone]   RxID:   [id number] PRAVACHOL 40 MG  TABS (PRAVASTATIN SODIUM) 1 by mouth q evening with a low fat snack  #30 x 11   Entered and Authorized by:   Judith Part MD   Signed by:   Judith Part MD on 10/21/2010   Method used:   [facility]        [facility]  [address]       [address]       [place], [place]  [address]       Ph: [phone] or [phone]       Fax: [phone]   RxID:   [id number]    Orders Added: 1)  Venipuncture [20254] 2)  TLB-Lipid Panel [80061-LIPID] 3)  TLB-Hepatic/Liver Function Pnl [80076-HEPATIC] 4)  TLB-TSH (Thyroid Stimulating Hormone) [84443-TSH] 5)  T-Vitamin D (25-Hydroxy) [phone] 6)  TLB-CBC Platelet - w/Differential [85025-CBCD] 7)  Specimen Handling [99000] 8)  Pneumococcal Vaccine [90732] 9)  Admin 1st Vaccine [90471] 10)  Est. Patient Level IV [31517] 11)  Prescription Created Electronically [[id number]]   Immunization History:  Influenza Immunization History:    Influenza:  historical received at work (09/04/2010)  Immunizations Administered:  Pneumonia Vaccine:    Vaccine Type: Pneumovax    Site: left deltoid    Mfr: Merck    Dose: 0.5 ml    Route: IM    Given by: Lewanda Rife LPN    Exp. Date: 03/18/2012    Lot #: [id number]    VIS given: 10/27/09 version given October 21, 2010.   Immunization History:  Influenza Immunization History:    Influenza:  Historical received at work (09/04/2010)  Immunizations Administered:  Pneumonia Vaccine:    Vaccine Type: Pneumovax    Site: left deltoid    Mfr: Merck    Dose: 0.5 ml    Route: IM    Given by: Lewanda Rife LPN    Exp. Date: 03/18/2012    Lot #: [id number]    VIS given: 10/27/09 version given October 21, 2010.  Current  Allergies (reviewed today): No known allergies     Preventive Care Screening  Last Pneumovax:    Date:  10/21/2010    Results:  Pneumovax  Last Flu Shot:    Date:  09/04/2010    Results:  Historical received at work   Pap Smear:    Date:  10/22/2009    Results:  normal

## 2010-12-24 NOTE — Assessment & Plan Note (Signed)
Summary: PULLED MUSCLE/DLO   Vital Signs:  Patient profile:   65 year old female Height:      61 inches Weight:      146.25 pounds BMI:     27.73 Temp:     98.2 degrees F oral Pulse rate:   80 / minute Pulse rhythm:   regular BP sitting:   116 / 76  (left arm) Cuff size:   regular  Vitals Entered By: Lewanda Rife LPN (August 19, 2010 9:14 AM) CC: pulled muscle lower back on 08/08/10   History of Present Illness: hurt back on 9/17-- picking up her brother after he fell pain started several hours later lower L side of her back  has a burning sensation and a dull ache  sharp when she moves certain ways   best is sit in hot water   worse is trying to get up after lying down   pain not shooting down leg  no numb or weak  no bowel or bladder changes   has been taking ibuprofen - making her ankles swell  Allergies (verified): No Known Drug Allergies  Past History:  Past Medical History: Last updated: 10/13/2009 GERD Hyperlipidemia Osteoporosis vit D deficiency shingles  allergic rhinitis    GI- Dr Troy Sine gyn- [facility] Mantle  Past Surgical History: Last updated: 08/09/2009 Hysterectomy- non cancer, ovaries intact (1980's) Dexa- osteopenia (05/2001),  OP (05/2005) Abd [place]- neg (05/2004) Wrist fracture Cataract surgery 9/10 colonoscopy normal - re check 5-10 y per Dr Troy Sine  Family History: Last updated: 08/15/2007 Father: esophageal cancer Mother: dementia Siblings: 4 brothers brother DM DM on mother's side aunt cancer ? ovarian  Social History: Last updated: 10/11/2008 Marital Status: divorced Children: 1 daughter Occupation: Land- retired non smoker  no alcohol   Risk Factors: Smoking Status: never (07/07/2007)  Review of Systems General:  Complains of fatigue; denies chills, fever, loss of appetite, and malaise. Eyes:  Denies blurring and eye irritation. CV:  Denies chest pain or discomfort, lightheadness, palpitations, and  shortness of breath with exertion. Resp:  Denies cough, shortness of breath, and wheezing. GI:  Denies abdominal pain, change in bowel habits, and indigestion. GU:  Denies dysuria and urinary frequency. MS:  Complains of low back pain, mid back pain, and stiffness; denies joint pain, cramps, and muscle weakness. Derm:  Denies itching, lesion(s), and rash. Neuro:  Denies numbness and tingling. Heme:  Denies abnormal bruising and bleeding.  Physical Exam  General:  Well-developed,well-nourished,in no acute distress; alert,appropriate and cooperative throughout examination Head:  normocephalic, atraumatic, and no abnormalities observed.   Eyes:  vision grossly intact, pupils equal, and pupils round.   Mouth:  pharynx pink and moist.   Neck:  supple with full rom and no masses or thyromegally, no JVD or carotid bruit  Chest Wall:  No deformities, masses, or tenderness noted. Lungs:  Normal respiratory effort, chest expands symmetrically. Lungs are clear to auscultation, no crackles or wheezes. Heart:  Normal rate and regular rhythm. S1 and S2 normal without gallop, murmur, click, rub or other extra sounds. Abdomen:  no suprapubic tenderness or fullness felt  Msk:  no LS tenderness L buttock spasm and tenderness neg SLR nl rom hips gait is slow  flex 45 deg/ ext 10 deg and pain on L lat flex Pulses:  R and L carotid,radial,femoral,dorsalis pedis and posterior tibial pulses are full and equal bilaterally Extremities:  trace left pedal edema.   Neurologic:  strength normal in all extremities, sensation intact to  light touch, and DTRs symmetrical and normal.   Skin:  Intact without suspicious lesions or rashes Cervical Nodes:  No lymphadenopathy noted Inguinal Nodes:  No significant adenopathy Psych:  normal affect, talkative and pleasant    Impression & Recommendations:  Problem # 1:  BACK PAIN (ICD-724.5) Assessment New  L low back pain / strain / muscle spasm recommend sympt care-  see pt instructions   recommend walking mobic and flexeril as needed with caution  update if not imp in 1 wk- consider PT  Her updated medication list for this problem includes:    Mobic 7.5 Mg Tabs (Meloxicam) .Marland Kitchen... 1 by mouth once daily with food as needed shoulder pain    Flexeril 10 Mg Tabs (Cyclobenzaprine hcl) .Marland Kitchen... 1/2 to 1 by mouth up to three times a day as needed muscle spasm/ back pain  Orders: Prescription Created Electronically [phone])  Complete Medication List: 1)  Calcium-vitamin D 600-200 Mg-unit Tabs (Calcium-vitamin d) .... Take two by mouth qd 2)  Fosamax 70 Mg Tabs (Alendronate sodium) .Marland Kitchen.. 1 by mouth once weekly 3)  Pravachol 40 Mg Tabs (Pravastatin sodium) .Marland Kitchen.. 1 by mouth q evening with a low fat snack 4)  Vitamin D 2000 Unit Tabs (Cholecalciferol) .... Take 1 tablet by mouth once a day 5)  Mobic 7.5 Mg Tabs (Meloxicam) .Marland Kitchen.. 1 by mouth once daily with food as needed shoulder pain 6)  Vitamin D (ergocalciferol) 50000 Unit Caps (Ergocalciferol) .Marland Kitchen.. 1 by mouth once weekly for 12 weeks 7)  Prilosec 20 Mg Cpdr (Omeprazole) .Marland Kitchen.. 1 by mouth once daily in am 8)  Allergry Otc  .... Otc as directed. 9)  Flexeril 10 Mg Tabs (Cyclobenzaprine hcl) .... 1/2 to 1 by mouth up to three times a day as needed muscle spasm/ back pain  Patient Instructions: 1)  take mobic once per day  (no more advil)  2)  use heat on back  3)  walk a lot -- no heavy lifting  4)  try flexeril (muscle relaxer ) as needed -- careful of sedation  5)  update me if not improved in a week -- will consider PT referral  Prescriptions: FLEXERIL 10 MG TABS (CYCLOBENZAPRINE HCL) 1/2 to 1 by mouth up to three times a day as needed muscle spasm/ back pain  #30 x 0   Entered and Authorized by:   Judith Part MD   Signed by:   Judith Part MD on 08/19/2010   Method used:   Electronically to        [facility]  [address] (retail)       [address]       [place], [place]  [address]       Ph:  [phone] or [phone]       Fax: [phone]   RxID:   [id number]   Current Allergies (reviewed today): No known allergies

## 2010-12-24 NOTE — Letter (Signed)
Summary: [facility]   Imported By: Sherian Rein 10/27/2010 11:33:52  _____________________________________________________________________  External Attachment:    Type:   Image     Comment:   External Document

## 2010-12-24 NOTE — Letter (Signed)
Summary: [facility]   Imported By: Lanelle Bal 10/08/2010 08:44:40  _____________________________________________________________________  External Attachment:    Type:   Image     Comment:   External Document

## 2010-12-25 ENCOUNTER — Ambulatory Visit: Payer: Medicare Other | Attending: General Surgery | Admitting: Physical Therapy

## 2010-12-25 DIAGNOSIS — M25519 Pain in unspecified shoulder: Secondary | ICD-10-CM | POA: Insufficient documentation

## 2010-12-25 DIAGNOSIS — M25619 Stiffness of unspecified shoulder, not elsewhere classified: Secondary | ICD-10-CM | POA: Insufficient documentation

## 2010-12-25 DIAGNOSIS — IMO0001 Reserved for inherently not codable concepts without codable children: Secondary | ICD-10-CM | POA: Insufficient documentation

## 2010-12-25 DIAGNOSIS — R609 Edema, unspecified: Secondary | ICD-10-CM | POA: Insufficient documentation

## 2010-12-25 DIAGNOSIS — M24519 Contracture, unspecified shoulder: Secondary | ICD-10-CM | POA: Insufficient documentation

## 2010-12-30 ENCOUNTER — Ambulatory Visit: Payer: Medicare Other | Admitting: Physical Therapy

## 2010-12-30 ENCOUNTER — Encounter: Payer: PRIVATE HEALTH INSURANCE | Admitting: Physical Therapy

## 2010-12-30 NOTE — Letter (Signed)
Summary: [facility]   Imported By: Sherian Rein 12/22/2010 12:48:45  _____________________________________________________________________  External Attachment:    Type:   Image     Comment:   External Document

## 2011-01-04 ENCOUNTER — Encounter: Payer: PRIVATE HEALTH INSURANCE | Admitting: Physical Therapy

## 2011-01-06 ENCOUNTER — Encounter: Payer: PRIVATE HEALTH INSURANCE | Admitting: Physical Therapy

## 2011-01-11 ENCOUNTER — Ambulatory Visit: Payer: Medicare Other | Admitting: Physical Therapy

## 2011-01-13 ENCOUNTER — Ambulatory Visit: Payer: Medicare Other | Admitting: Physical Therapy

## 2011-01-18 ENCOUNTER — Ambulatory Visit: Payer: Medicare Other | Admitting: Physical Therapy

## 2011-01-19 NOTE — Letter (Signed)
Summary: [facility]  [facility]   Imported By: Maryln Gottron 01/05/2011 13:13:19  _____________________________________________________________________  External Attachment:    Type:   Image     Comment:   External Document

## 2011-01-20 ENCOUNTER — Ambulatory Visit: Payer: Medicare Other | Admitting: Physical Therapy

## 2011-01-25 ENCOUNTER — Ambulatory Visit: Payer: Medicare Other | Attending: General Surgery | Admitting: Physical Therapy

## 2011-01-25 DIAGNOSIS — M25519 Pain in unspecified shoulder: Secondary | ICD-10-CM | POA: Insufficient documentation

## 2011-01-25 DIAGNOSIS — R609 Edema, unspecified: Secondary | ICD-10-CM | POA: Insufficient documentation

## 2011-01-25 DIAGNOSIS — M25619 Stiffness of unspecified shoulder, not elsewhere classified: Secondary | ICD-10-CM | POA: Insufficient documentation

## 2011-01-25 DIAGNOSIS — M24519 Contracture, unspecified shoulder: Secondary | ICD-10-CM | POA: Insufficient documentation

## 2011-01-25 DIAGNOSIS — IMO0001 Reserved for inherently not codable concepts without codable children: Secondary | ICD-10-CM | POA: Insufficient documentation

## 2011-01-27 ENCOUNTER — Ambulatory Visit: Payer: Medicare Other | Admitting: Physical Therapy

## 2011-02-01 ENCOUNTER — Ambulatory Visit: Payer: Medicare Other | Admitting: Physical Therapy

## 2011-02-02 LAB — COMPREHENSIVE METABOLIC PANEL
Albumin: 3.9 g/dL (ref 3.5–5.2)
Alkaline Phosphatase: 51 U/L (ref 39–117)
BUN: 9 mg/dL (ref 6–23)
Chloride: 105 mEq/L (ref 96–112)
Potassium: 4.5 mEq/L (ref 3.5–5.1)
Total Bilirubin: 0.4 mg/dL (ref 0.3–1.2)

## 2011-02-02 LAB — DIFFERENTIAL
Eosinophils Absolute: 0.1 10*3/uL (ref 0.0–0.7)
Eosinophils Relative: 1 % (ref 0–5)
Lymphocytes Relative: 33 % (ref 12–46)
Lymphs Abs: 2 10*3/uL (ref 0.7–4.0)
Monocytes Relative: 9 % (ref 3–12)

## 2011-02-02 LAB — CBC
HCT: 34.5 % — ABNORMAL LOW (ref 36.0–46.0)
MCV: 76.2 fL — ABNORMAL LOW (ref 78.0–100.0)
Platelets: 290 10*3/uL (ref 150–400)
RBC: 4.53 MIL/uL (ref 3.87–5.11)
WBC: 6 10*3/uL (ref 4.0–10.5)

## 2011-02-02 LAB — POCT HEMOGLOBIN-HEMACUE: Hemoglobin: 11.3 g/dL — ABNORMAL LOW (ref 12.0–15.0)

## 2011-02-03 ENCOUNTER — Ambulatory Visit: Payer: Medicare Other | Admitting: Physical Therapy

## 2011-02-08 ENCOUNTER — Encounter: Payer: PRIVATE HEALTH INSURANCE | Admitting: Physical Therapy

## 2011-02-08 LAB — COMPREHENSIVE METABOLIC PANEL
ALT: 29 U/L (ref 0–35)
Alkaline Phosphatase: 48 U/L (ref 39–117)
BUN: 13 mg/dL (ref 6–23)
CO2: 32 mEq/L (ref 19–32)
Calcium: 9.6 mg/dL (ref 8.4–10.5)
GFR calc non Af Amer: 49 mL/min — ABNORMAL LOW (ref >60)
Glucose, Bld: 110 mg/dL — ABNORMAL HIGH (ref 70–99)
Potassium: 4.8 mEq/L (ref 3.5–5.1)
Sodium: 142 mEq/L (ref 135–145)
Total Protein: 6.7 g/dL (ref 6.0–8.3)

## 2011-02-08 LAB — CBC
HCT: 35.7 % — ABNORMAL LOW (ref 36.0–46.0)
Hemoglobin: 11.5 g/dL — ABNORMAL LOW (ref 12.0–15.0)
Hemoglobin: 9.5 g/dL — ABNORMAL LOW (ref 12.0–15.0)
MCHC: 32.2 g/dL (ref 30.0–36.0)
RBC: 3.84 MIL/uL — ABNORMAL LOW (ref 3.87–5.11)
RBC: 4.66 MIL/uL (ref 3.87–5.11)
RDW: 17 % — ABNORMAL HIGH (ref 11.5–15.5)
WBC: 12.2 10*3/uL — ABNORMAL HIGH (ref 4.0–10.5)

## 2011-02-08 LAB — POCT HEMOGLOBIN-HEMACUE: Hemoglobin: 11 g/dL — ABNORMAL LOW (ref 12.0–15.0)

## 2011-02-08 LAB — DIFFERENTIAL
Basophils Relative: 0 % (ref 0–1)
Eosinophils Absolute: 0 10*3/uL (ref 0.0–0.7)
Neutro Abs: 3.5 10*3/uL (ref 1.7–7.7)
Neutrophils Relative %: 59 % (ref 43–77)

## 2011-02-10 ENCOUNTER — Encounter: Payer: PRIVATE HEALTH INSURANCE | Admitting: Physical Therapy

## 2011-02-15 ENCOUNTER — Ambulatory Visit: Payer: Medicare Other | Admitting: Physical Therapy

## 2011-02-17 ENCOUNTER — Ambulatory Visit: Payer: Medicare Other | Admitting: Physical Therapy

## 2011-02-22 ENCOUNTER — Ambulatory Visit: Payer: Medicare Other | Attending: General Surgery | Admitting: Physical Therapy

## 2011-02-22 DIAGNOSIS — M25619 Stiffness of unspecified shoulder, not elsewhere classified: Secondary | ICD-10-CM | POA: Insufficient documentation

## 2011-02-22 DIAGNOSIS — R609 Edema, unspecified: Secondary | ICD-10-CM | POA: Insufficient documentation

## 2011-02-22 DIAGNOSIS — IMO0001 Reserved for inherently not codable concepts without codable children: Secondary | ICD-10-CM | POA: Insufficient documentation

## 2011-02-22 DIAGNOSIS — M25519 Pain in unspecified shoulder: Secondary | ICD-10-CM | POA: Insufficient documentation

## 2011-02-22 DIAGNOSIS — M24519 Contracture, unspecified shoulder: Secondary | ICD-10-CM | POA: Insufficient documentation

## 2011-02-24 ENCOUNTER — Encounter: Payer: PRIVATE HEALTH INSURANCE | Admitting: Physical Therapy

## 2011-03-29 ENCOUNTER — Other Ambulatory Visit: Payer: Self-pay | Admitting: Oncology

## 2011-03-29 ENCOUNTER — Encounter (HOSPITAL_BASED_OUTPATIENT_CLINIC_OR_DEPARTMENT_OTHER): Payer: Medicare Other | Admitting: Oncology

## 2011-03-29 DIAGNOSIS — E7800 Pure hypercholesterolemia, unspecified: Secondary | ICD-10-CM

## 2011-03-29 DIAGNOSIS — C50519 Malignant neoplasm of lower-outer quadrant of unspecified female breast: Secondary | ICD-10-CM

## 2011-03-29 DIAGNOSIS — K219 Gastro-esophageal reflux disease without esophagitis: Secondary | ICD-10-CM

## 2011-03-29 DIAGNOSIS — C50919 Malignant neoplasm of unspecified site of unspecified female breast: Secondary | ICD-10-CM

## 2011-03-29 LAB — FERRITIN: Ferritin: 14 ng/mL (ref 10–291)

## 2011-03-29 LAB — CBC WITH DIFFERENTIAL/PLATELET
Basophils Absolute: 0 10*3/uL (ref 0.0–0.1)
Eosinophils Absolute: 0.1 10*3/uL (ref 0.0–0.5)
HCT: 36.9 % (ref 34.8–46.6)
HGB: 12.4 g/dL (ref 11.6–15.9)
LYMPH%: 23 % (ref 14.0–49.7)
MCV: 83 fL (ref 79.5–101.0)
MONO#: 0.5 10*3/uL (ref 0.1–0.9)
NEUT#: 4.2 10*3/uL (ref 1.5–6.5)
NEUT%: 68.1 % (ref 38.4–76.8)
Platelets: 260 10*3/uL (ref 145–400)
WBC: 6.1 10*3/uL (ref 3.9–10.3)

## 2011-04-30 ENCOUNTER — Other Ambulatory Visit (INDEPENDENT_AMBULATORY_CARE_PROVIDER_SITE_OTHER): Payer: Medicare Other | Admitting: Family Medicine

## 2011-04-30 DIAGNOSIS — E7800 Pure hypercholesterolemia, unspecified: Secondary | ICD-10-CM

## 2011-04-30 DIAGNOSIS — D649 Anemia, unspecified: Secondary | ICD-10-CM

## 2011-04-30 LAB — CBC WITH DIFFERENTIAL/PLATELET
Basophils Absolute: 0 10*3/uL (ref 0.0–0.1)
HCT: 38.9 % (ref 36.0–46.0)
Lymphs Abs: 1.9 10*3/uL (ref 0.7–4.0)
Monocytes Absolute: 0.5 10*3/uL (ref 0.1–1.0)
Monocytes Relative: 7.7 % (ref 3.0–12.0)
Neutrophils Relative %: 60.5 % (ref 43.0–77.0)
Platelets: 280 10*3/uL (ref 150.0–400.0)
RDW: 15.1 % — ABNORMAL HIGH (ref 11.5–14.6)

## 2011-04-30 LAB — LIPID PANEL
HDL: 53.6 mg/dL (ref >39.00)
Total CHOL/HDL Ratio: 4
VLDL: 37.4 mg/dL (ref 0.0–40.0)

## 2011-04-30 LAB — ALT: ALT: 29 U/L (ref 0–35)

## 2011-04-30 LAB — FERRITIN: Ferritin: 17.4 ng/mL (ref 10.0–291.0)

## 2011-05-03 ENCOUNTER — Other Ambulatory Visit: Payer: Self-pay

## 2011-05-04 ENCOUNTER — Telehealth: Payer: Self-pay

## 2011-05-04 NOTE — Telephone Encounter (Signed)
Pt requested copy of lab results mailed to her home address. Lab results mailed to patient as instructed.

## 2011-06-07 ENCOUNTER — Other Ambulatory Visit: Payer: Self-pay | Admitting: Oncology

## 2011-06-07 ENCOUNTER — Encounter (HOSPITAL_BASED_OUTPATIENT_CLINIC_OR_DEPARTMENT_OTHER): Payer: Medicare Other | Admitting: Oncology

## 2011-06-07 DIAGNOSIS — E7800 Pure hypercholesterolemia, unspecified: Secondary | ICD-10-CM

## 2011-06-07 DIAGNOSIS — C50519 Malignant neoplasm of lower-outer quadrant of unspecified female breast: Secondary | ICD-10-CM

## 2011-06-07 DIAGNOSIS — C50919 Malignant neoplasm of unspecified site of unspecified female breast: Secondary | ICD-10-CM

## 2011-06-07 DIAGNOSIS — K219 Gastro-esophageal reflux disease without esophagitis: Secondary | ICD-10-CM

## 2011-06-07 LAB — CBC WITH DIFFERENTIAL/PLATELET
EOS%: 0.8 % (ref 0.0–7.0)
MCH: 29.8 pg (ref 25.1–34.0)
MCV: 87.2 fL (ref 79.5–101.0)
MONO%: 8.9 % (ref 0.0–14.0)
NEUT#: 3.4 10*3/uL (ref 1.5–6.5)
RBC: 4.45 10*6/uL (ref 3.70–5.45)
RDW: 13.1 % (ref 11.2–14.5)

## 2011-06-07 LAB — FERRITIN: Ferritin: 27 ng/mL (ref 10–291)

## 2011-06-08 ENCOUNTER — Ambulatory Visit (INDEPENDENT_AMBULATORY_CARE_PROVIDER_SITE_OTHER): Payer: Medicare Other | Admitting: Family Medicine

## 2011-06-08 ENCOUNTER — Encounter: Payer: Self-pay | Admitting: Family Medicine

## 2011-06-08 DIAGNOSIS — IMO0002 Reserved for concepts with insufficient information to code with codable children: Secondary | ICD-10-CM

## 2011-06-08 DIAGNOSIS — L02619 Cutaneous abscess of unspecified foot: Secondary | ICD-10-CM

## 2011-06-08 MED ORDER — DOXYCYCLINE HYCLATE 100 MG PO TABS: 100.0000 mg | ORAL_TABLET | Freq: Two times a day (BID) | ORAL | Status: AC

## 2011-06-08 NOTE — Progress Notes (Signed)
R 1st toe/foot pain.    Was walking a lot on Friday, was wearing good shoes.  Sore toe noted on Saturday, no help with soaks.  No better on Sunday and then called about eval.  Redness noted at base of 1st nail on Saturday.  No FCNAV.  No specific trauma.  No h/o gout.    Meds, vitals, and allergies reviewed.   ROS: See HPI.  Otherwise, noncontributory.  nad Normal inspection of R foot except for erythema at base of the 1st nail.  Nail is intact.  Distally nv intact.  She has no erythema at IP joint.  No fluctuant mass, but the skin proximal to the base of the nail is tender.

## 2011-06-08 NOTE — Patient Instructions (Addendum)
Start the antibiotics, soak your foot in warm water, and let me know if the redness or pain gets worse.  Take care.   Doxycyline can make you sun sensitive.

## 2011-06-08 NOTE — Assessment & Plan Note (Signed)
It is erythematous, but I don't feel and palpable/fluctuant mass that needs I&D.  D/w pt about this.  I would soak the foot and use doxy in the meantime.  Call back if inc in pain/erythema/other concerns.  She agrees, understands.

## 2011-06-14 ENCOUNTER — Encounter (HOSPITAL_BASED_OUTPATIENT_CLINIC_OR_DEPARTMENT_OTHER): Payer: Medicare Other | Admitting: Oncology

## 2011-06-14 DIAGNOSIS — C50419 Malignant neoplasm of upper-outer quadrant of unspecified female breast: Secondary | ICD-10-CM

## 2011-06-14 DIAGNOSIS — Z170 Estrogen receptor positive status [ER+]: Secondary | ICD-10-CM

## 2011-06-23 ENCOUNTER — Other Ambulatory Visit: Payer: Self-pay | Admitting: Oncology

## 2011-06-23 ENCOUNTER — Encounter (HOSPITAL_BASED_OUTPATIENT_CLINIC_OR_DEPARTMENT_OTHER): Payer: Medicare Other | Admitting: Oncology

## 2011-06-23 DIAGNOSIS — C50919 Malignant neoplasm of unspecified site of unspecified female breast: Secondary | ICD-10-CM

## 2011-06-23 DIAGNOSIS — C50519 Malignant neoplasm of lower-outer quadrant of unspecified female breast: Secondary | ICD-10-CM

## 2011-06-23 DIAGNOSIS — E7800 Pure hypercholesterolemia, unspecified: Secondary | ICD-10-CM

## 2011-06-23 DIAGNOSIS — K219 Gastro-esophageal reflux disease without esophagitis: Secondary | ICD-10-CM

## 2011-06-23 LAB — CBC WITH DIFFERENTIAL/PLATELET
Basophils Absolute: 0 10*3/uL (ref 0.0–0.1)
Eosinophils Absolute: 0 10*3/uL (ref 0.0–0.5)
HCT: 39.5 % (ref 34.8–46.6)
HGB: 13.6 g/dL (ref 11.6–15.9)
MONO#: 0.6 10*3/uL (ref 0.1–0.9)
NEUT#: 3.5 10*3/uL (ref 1.5–6.5)
NEUT%: 57.5 % (ref 38.4–76.8)
WBC: 6.1 10*3/uL (ref 3.9–10.3)
lymph#: 2 10*3/uL (ref 0.9–3.3)

## 2011-06-25 ENCOUNTER — Ambulatory Visit: Payer: Medicare Other | Admitting: Family Medicine

## 2011-08-03 ENCOUNTER — Other Ambulatory Visit (INDEPENDENT_AMBULATORY_CARE_PROVIDER_SITE_OTHER): Payer: Medicare Other

## 2011-08-03 DIAGNOSIS — E7800 Pure hypercholesterolemia, unspecified: Secondary | ICD-10-CM

## 2011-08-04 LAB — LIPID PANEL
HDL: 49.8 mg/dL (ref >39.00)
VLDL: 43.2 mg/dL — ABNORMAL HIGH (ref 0.0–40.0)

## 2011-08-04 LAB — ALT: ALT: 27 U/L (ref 0–35)

## 2011-08-04 LAB — AST: AST: 24 U/L (ref 0–37)

## 2011-08-17 ENCOUNTER — Ambulatory Visit (INDEPENDENT_AMBULATORY_CARE_PROVIDER_SITE_OTHER): Payer: Medicare Other | Admitting: Family Medicine

## 2011-08-17 ENCOUNTER — Encounter: Payer: Self-pay | Admitting: Family Medicine

## 2011-08-17 VITALS — BP 124/70 | HR 92 | Temp 98.1°F | Ht 61.0 in | Wt 147.0 lb

## 2011-08-17 DIAGNOSIS — Z8719 Personal history of other diseases of the digestive system: Secondary | ICD-10-CM

## 2011-08-17 DIAGNOSIS — E785 Hyperlipidemia, unspecified: Secondary | ICD-10-CM

## 2011-08-17 NOTE — Assessment & Plan Note (Signed)
Long discussion about lifestyle change and low satfat diet- pt is finally willing to make the effort  Rev lab with pt Rev low sat fat diet in detail Rev plan for exercise 5 d per week  At this point has failed zetia and pravachol -max dose due to intolerance If no imp in chol in dec will disc trial of another statin

## 2011-08-17 NOTE — Patient Instructions (Addendum)
Avoid red meat/ fried foods/ egg yolks/ fatty breakfast meats/ butter, cheese and high fat dairy/ and shellfish   Make your goal 30 minutes of exercise daily - good aerobic exercise - this will give you more energy  Schedule 30 minute check up in mid December with labs prior  Call if you change your mind about a flu shot - I think it would be a good idea

## 2011-08-17 NOTE — Progress Notes (Signed)
Subjective:    Patient ID: Brianna [name], female    DOB: 06/23/1946, 65 y.o.   MRN: [id number]  HPI Here for f/u of hyperlipidemia and fatty liver dz   Declines flu shot today  Is feeling good overall - fortunately  When she gets tired - she hurts in breast area where her cancer was     Cholesterol went up with LDL 185 (from 160s) Prev had bad diet  Prev took pravachol 80 Is off of it because it made her legs hurt severely - took her a while to figure it out  It happened when she went up on the dose   Chol went up and LDL went up from 160s to 180s  Lab Results  Component Value Date   CHOL 252* 08/03/2011   CHOL 237* 04/30/2011   CHOL 201* 11/30/2010   Lab Results  Component Value Date   HDL 49.80 08/03/2011   HDL 40.98 04/30/2011   HDL 52.30 11/30/2010   Lab Results  Component Value Date   LDLCALC 99 10/13/2009   LDLCALC 107* 10/02/2008   LDLCALC 120* 09/26/2007   Lab Results  Component Value Date   TRIG 216.0* 08/03/2011   TRIG 187.0* 04/30/2011   TRIG 101.0 11/30/2010   Lab Results  Component Value Date   CHOLHDL 5 08/03/2011   CHOLHDL 4 04/30/2011   CHOLHDL 4 11/30/2010   Lab Results  Component Value Date   LDLDIRECT 185.7 08/03/2011   LDLDIRECT 162.9 04/30/2011   LDLDIRECT 139.7 11/30/2010   last time told me her diet was terrible Is still eating dessert at night  Gained a few lbs  Eats a lot of cheese - in spells  No fried foods  Eats beef fairly often  Eats shellfish occasionally  Does eat egg yolks Eats country ham and bacon  Has been on zetia -- did not work well  pravachol is the only statins   Neither parents had known high chol  Exercise is not exercising at all  Has gone back to work - that is good - likes her lifestyle     Wt is up 3 lb 124/70- good bp  Patient Active Problem List  Diagnoses  . BREAST CANCER  . VITAMIN D DEFICIENCY  . HYPERLIPIDEMIA  . GERD  . OSTEOPOROSIS  . FATTY LIVER DISEASE, HX OF  . UNSPECIFIED ANEMIA  . Cellulitis  and abscess of finger or toe   Past Medical History  Diagnosis Date  . GERD (gastroesophageal reflux disease)   . Hyperlipidemia   . Osteoporosis     fosamax started 9/08  . Vitamin D deficiency   . Shingles   . Allergic rhinitis   . Breast carcinoma 10/11    right- eventual double mastectomy in 2012   Past Surgical History  Procedure Date  . Partial hysterectomy 1980's    non cancer, ovaries intact  . Wrist fracture surgery   . Cataract extraction   . Breast biopsy 10/11    invasive and in situ mam carcinoma  . Breast lumpectomy 11/11  . Mastectomy 1/12    double- Dr. Dwain Sarna   History  Substance Use Topics  . Smoking status: Never Smoker   . Smokeless tobacco: Not on file  . Alcohol Use: No   Family History  Problem Relation Age of Onset  . Dementia Mother   . Cancer Father     esophageal  . Diabetes Brother   . Cancer Paternal Aunt     ovarian  No Known Allergies Current Outpatient Prescriptions on File Prior to Visit  Medication Sig Dispense Refill  . alendronate (FOSAMAX) 70 MG tablet Take one by mouth once weekly       . Calcium Carbonate-Vitamin D (CALCIUM-VITAMIN D) 600-200 MG-UNIT CAPS Take 2 by mouth daily       . Coenzyme Q10 (CO Q-10) 200 MG CAPS Take by mouth 2 (two) times daily.        Marland Kitchen docusate sodium (COLACE) 100 MG capsule Take 100 mg by mouth daily.        . Ergocalciferol (VITAMIN D2) 2000 UNITS TABS Take one tablet by mouth once a day.       . fexofenadine (ALLEGRA) 180 MG tablet Take otc as directed       . Flaxseed, Linseed, (FLAXSEED OIL) 1000 MG CAPS Take by mouth 3 (three) times daily.        Marland Kitchen omeprazole (PRILOSEC) 20 MG capsule Take one by mouth once daily in the morning.       . Red Yeast Rice Extract (RED YEAST RICE PO) Take by mouth 2 (two) times daily.        . tamoxifen (NOLVADEX) 20 MG tablet Take 20 mg by mouth daily.        . pravastatin (PRAVACHOL) 80 MG tablet Take 80 mg by mouth daily.             Review of  Systems Review of Systems  Constitutional: Negative for fever, appetite change, fatigue and unexpected weight change.  Eyes: Negative for pain and visual disturbance.  Respiratory: Negative for cough and shortness of breath.   Cardiovascular: Negative for cp or palpitations    Gastrointestinal: Negative for nausea, diarrhea and constipation.  Genitourinary: Negative for urgency and frequency.  Skin: Negative for pallor or rash   Neurological: Negative for weakness, light-headedness, numbness and headaches.  Hematological: Negative for adenopathy. Does not bruise/bleed easily.  Psychiatric/Behavioral: Negative for dysphoric mood. The patient is not nervous/anxious.          Objective:   Physical Exam  Constitutional: She appears well-developed and well-nourished. No distress.       Mildly overwt and well app  HENT:  Head: Normocephalic and atraumatic.  Mouth/Throat: Oropharynx is clear and moist.  Eyes: Conjunctivae and EOM are normal. Pupils are equal, round, and reactive to light.  Neck: Normal range of motion. Neck supple. No JVD present. Carotid bruit is not present. No thyromegaly present.  Cardiovascular: Normal rate, regular rhythm, normal heart sounds and intact distal pulses.  Exam reveals no gallop.   Pulmonary/Chest: Breath sounds normal. No respiratory distress. She has no wheezes.  Musculoskeletal: She exhibits no edema and no tenderness.  Lymphadenopathy:    She has no cervical adenopathy.  Neurological: She is alert. She has normal reflexes. Coordination normal.  Skin: Skin is warm and dry. No rash noted. No erythema. No pallor.  Psychiatric: She has a normal mood and affect.          Assessment & Plan:

## 2011-08-17 NOTE — Assessment & Plan Note (Signed)
Ast/alt are stable-rev with pt  Expect wt loss with lifestyle change will help even more

## 2011-09-09 ENCOUNTER — Encounter: Payer: Self-pay | Admitting: Family Medicine

## 2011-09-30 ENCOUNTER — Other Ambulatory Visit: Payer: Self-pay | Admitting: *Deleted

## 2011-09-30 MED ORDER — ALENDRONATE SODIUM 70 MG PO TABS: ORAL_TABLET | ORAL | Status: DC

## 2011-11-01 ENCOUNTER — Telehealth: Payer: Self-pay | Admitting: Family Medicine

## 2011-11-01 DIAGNOSIS — Z8719 Personal history of other diseases of the digestive system: Secondary | ICD-10-CM

## 2011-11-01 DIAGNOSIS — M810 Age-related osteoporosis without current pathological fracture: Secondary | ICD-10-CM

## 2011-11-01 DIAGNOSIS — D649 Anemia, unspecified: Secondary | ICD-10-CM

## 2011-11-01 DIAGNOSIS — E785 Hyperlipidemia, unspecified: Secondary | ICD-10-CM

## 2011-11-01 DIAGNOSIS — E559 Vitamin D deficiency, unspecified: Secondary | ICD-10-CM

## 2011-11-01 DIAGNOSIS — K219 Gastro-esophageal reflux disease without esophagitis: Secondary | ICD-10-CM

## 2011-11-01 NOTE — Telephone Encounter (Signed)
Message copied by Judy Pimple on Mon Nov 01, 2011  9:14 PM ------      Message from: Alvina Chou      Created: Tue Oct 26, 2011 11:56 AM      Regarding: orders for 11-02-11       Patient is scheduled for CPX labs, please order future labs, Thanks , Camelia Eng

## 2011-11-02 ENCOUNTER — Other Ambulatory Visit (INDEPENDENT_AMBULATORY_CARE_PROVIDER_SITE_OTHER): Payer: Medicare Other | Admitting: Radiology

## 2011-11-02 ENCOUNTER — Ambulatory Visit (INDEPENDENT_AMBULATORY_CARE_PROVIDER_SITE_OTHER): Payer: Medicare Other | Admitting: Family Medicine

## 2011-11-02 DIAGNOSIS — E785 Hyperlipidemia, unspecified: Secondary | ICD-10-CM

## 2011-11-02 DIAGNOSIS — K219 Gastro-esophageal reflux disease without esophagitis: Secondary | ICD-10-CM

## 2011-11-02 DIAGNOSIS — M810 Age-related osteoporosis without current pathological fracture: Secondary | ICD-10-CM

## 2011-11-02 DIAGNOSIS — D649 Anemia, unspecified: Secondary | ICD-10-CM

## 2011-11-02 DIAGNOSIS — Z8719 Personal history of other diseases of the digestive system: Secondary | ICD-10-CM

## 2011-11-02 DIAGNOSIS — E559 Vitamin D deficiency, unspecified: Secondary | ICD-10-CM

## 2011-11-02 LAB — CBC WITH DIFFERENTIAL/PLATELET
Basophils Absolute: 0 10*3/uL (ref 0.0–0.1)
Basophils Relative: 0.3 % (ref 0.0–3.0)
Eosinophils Absolute: 0.1 10*3/uL (ref 0.0–0.7)
Hemoglobin: 13.9 g/dL (ref 12.0–15.0)
Lymphocytes Relative: 31.4 % (ref 12.0–46.0)
MCHC: 34.2 g/dL (ref 30.0–36.0)
Monocytes Relative: 7.8 % (ref 3.0–12.0)
Neutro Abs: 3.8 10*3/uL (ref 1.4–7.7)
Neutrophils Relative %: 59.2 % (ref 43.0–77.0)
RBC: 4.55 Mil/uL (ref 3.87–5.11)

## 2011-11-02 LAB — COMPREHENSIVE METABOLIC PANEL
ALT: 37 U/L — ABNORMAL HIGH (ref 0–35)
AST: 30 U/L (ref 0–37)
Albumin: 4.1 g/dL (ref 3.5–5.2)
BUN: 19 mg/dL (ref 6–23)
CO2: 25 mEq/L (ref 19–32)
Calcium: 9.3 mg/dL (ref 8.4–10.5)
Chloride: 108 mEq/L (ref 96–112)
Creatinine, Ser: 1.1 mg/dL (ref 0.4–1.2)
GFR: 54.88 mL/min — ABNORMAL LOW (ref >60.00)
Potassium: 4.2 mEq/L (ref 3.5–5.1)

## 2011-11-02 LAB — TSH: TSH: 3.61 u[IU]/mL (ref 0.35–5.50)

## 2011-11-02 NOTE — Progress Notes (Signed)
  Subjective:    Patient ID: Brianna [name], female    DOB: 1946-02-23, 65 y.o.   MRN: [id number]  HPI  Opened in erroe  Review of Systems     Objective:   Physical Exam        Assessment & Plan:

## 2011-11-03 LAB — LDL CHOLESTEROL, DIRECT: Direct LDL: 136.2 mg/dL

## 2011-11-05 ENCOUNTER — Ambulatory Visit (INDEPENDENT_AMBULATORY_CARE_PROVIDER_SITE_OTHER): Payer: Medicare Other | Admitting: Family Medicine

## 2011-11-05 ENCOUNTER — Encounter: Payer: Self-pay | Admitting: Family Medicine

## 2011-11-05 VITALS — BP 126/80 | HR 76 | Temp 98.2°F | Ht 60.5 in | Wt 147.2 lb

## 2011-11-05 DIAGNOSIS — Z8719 Personal history of other diseases of the digestive system: Secondary | ICD-10-CM

## 2011-11-05 DIAGNOSIS — C50919 Malignant neoplasm of unspecified site of unspecified female breast: Secondary | ICD-10-CM

## 2011-11-05 DIAGNOSIS — Z23 Encounter for immunization: Secondary | ICD-10-CM

## 2011-11-05 DIAGNOSIS — E785 Hyperlipidemia, unspecified: Secondary | ICD-10-CM

## 2011-11-05 DIAGNOSIS — D649 Anemia, unspecified: Secondary | ICD-10-CM

## 2011-11-05 DIAGNOSIS — M810 Age-related osteoporosis without current pathological fracture: Secondary | ICD-10-CM

## 2011-11-05 MED ORDER — PRAVASTATIN SODIUM 80 MG PO TABS: 80.0000 mg | ORAL_TABLET | Freq: Every day | ORAL | Status: DC

## 2011-11-05 NOTE — Patient Instructions (Signed)
Avoid red meat/ fried foods/ egg yolks/ fatty breakfast meats/ butter, cheese and high fat dairy/ and shellfish  For cholesterol  If you are interested in shingles vaccine in future - call your insurance company to see how coverage is and call us to schedule  Schedule fasting labs and follow up in 6 months please  Flu shot today  Try to exercise regularly  We will schedule bone density test at check out

## 2011-11-05 NOTE — Assessment & Plan Note (Signed)
Overdue for dexa Continues fosamax year 3  Rev ca and d and exercise Rev safety issues

## 2011-11-05 NOTE — Assessment & Plan Note (Signed)
Better with pravachol which she is now tolerating  Disc goals for lipids and reasons to control them Rev labs with pt Rev low sat fat diet in detail  Re check 6 mo and f/u

## 2011-11-05 NOTE — Assessment & Plan Note (Signed)
With double mastectomy no longer needs mammograms  Followed by surg and oncology and doing very well

## 2011-11-05 NOTE — Assessment & Plan Note (Signed)
Labs improved with better diet  Enc wt loss/ exercise and low fat diet

## 2011-11-05 NOTE — Assessment & Plan Note (Signed)
Nl cbc today Lab Results  Component Value Date   WBC 6.4 11/02/2011   HGB 13.9 11/02/2011   HCT 40.6 11/02/2011   MCV 89.2 11/02/2011   PLT 268.0 11/02/2011    Pt is interested in donating blood again

## 2011-11-05 NOTE — Progress Notes (Signed)
Subjective:    Patient ID: Brianna [name], female    DOB: 19-Aug-1946, 65 y.o.   MRN: [id number]  HPI Here for check up of chronic medical conditions and to review health mt list  Got a flu shot today   Feeling good in general   Had an episode - got up very dizzy one am -- had to call her daughter over - spinning and off balance  Got nauseated and never vomited  Could not lie down  ? Positional vertigo Much better now    Breast cancer f/u-is doing very well , cancelled last f/u since everything was good  mammo- no longer has those   Gyn exam - was 5/11- all was well , does pap every other year   lipids are improved  Lab Results  Component Value Date   CHOL 209* 11/02/2011   HDL 50.60 11/02/2011   LDLCALC 99 10/13/2009   LDLDIRECT 136.2 11/02/2011   TRIG 180.0* 11/02/2011   CHOLHDL 4 11/02/2011   pravachol- back on it - legs hurt a bit but not too bad - wants to stay on it  Diet - is good on most days -- occ red meat , occ fried foods (does not like them), loves shrimp   Hx of fatty liver Wt stable with bmi of 28 Lab Results  Component Value Date   ALT 37* 11/02/2011   AST 30 11/02/2011   ALKPHOS 39 11/02/2011   BILITOT 0.6 11/02/2011   overall good   Sugar was 107 this check fasting - no symptoms of thirst or urination   Osteoporosis  ? Last dexa - thinks it may have been 2006 - has been over 2 years  Ca and D- on that  Last dexa  On fosamax - 3 years   Would like to get shingles vaccine   Patient Active Problem List  Diagnoses  . BREAST CANCER  . VITAMIN D DEFICIENCY  . HYPERLIPIDEMIA  . GERD  . OSTEOPOROSIS  . FATTY LIVER DISEASE, HX OF  . UNSPECIFIED ANEMIA   Past Medical History  Diagnosis Date  . GERD (gastroesophageal reflux disease)   . Hyperlipidemia   . Osteoporosis     fosamax started 9/08  . Vitamin D deficiency   . Shingles   . Allergic rhinitis   . Breast carcinoma 10/11    right- eventual double mastectomy in 2012   Past  Surgical History  Procedure Date  . Partial hysterectomy 1980's    non cancer, ovaries intact  . Wrist fracture surgery   . Cataract extraction   . Breast biopsy 10/11    invasive and in situ mam carcinoma  . Breast lumpectomy 11/11  . Mastectomy 1/12    double- Dr. Dwain Sarna   History  Substance Use Topics  . Smoking status: Never Smoker   . Smokeless tobacco: Not on file  . Alcohol Use: No   Family History  Problem Relation Age of Onset  . Dementia Mother   . Cancer Father     esophageal  . Diabetes Brother   . Cancer Paternal Aunt     ovarian   Allergies  Allergen Reactions  . Pravachol     myalgias  . Zetia (Ezetimibe)     Ineffective    Current Outpatient Prescriptions on File Prior to Visit  Medication Sig Dispense Refill  . alendronate (FOSAMAX) 70 MG tablet Take one by mouth once weekly  4 tablet  11  . Calcium Carbonate-Vitamin D (CALCIUM-VITAMIN D)  600-200 MG-UNIT CAPS Take 2 by mouth daily       . Coenzyme Q10 (CO Q-10) 200 MG CAPS Take by mouth 2 (two) times daily.        Marland Kitchen docusate sodium (COLACE) 100 MG capsule Take 100 mg by mouth daily.        . Ergocalciferol (VITAMIN D2) 2000 UNITS TABS Take one tablet by mouth once a day.       . fexofenadine (ALLEGRA) 180 MG tablet Take otc as directed       . Flaxseed, Linseed, (FLAXSEED OIL) 1000 MG CAPS Take by mouth 3 (three) times daily.        . Garlic Oil (ODORLESS GARLIC) 500 MG TABS Take 1 tablet by mouth 2 (two) times daily.        . Multiple Vitamin (MULTIVITAMIN) tablet Take 1 tablet by mouth daily.        . NON FORMULARY Super colon cleanse 500 mg once daily in evening       . omeprazole (PRILOSEC) 20 MG capsule Take one by mouth once daily in the morning.       . Red Yeast Rice Extract (RED YEAST RICE PO) Take by mouth 2 (two) times daily.        . tamoxifen (NOLVADEX) 20 MG tablet Take 20 mg by mouth daily.            Review of Systems Review of Systems  Constitutional: Negative for fever,  appetite change, fatigue and unexpected weight change.  Eyes: Negative for pain and visual disturbance.  Respiratory: Negative for cough and shortness of breath.   Cardiovascular: Negative for cp or palpitations    Gastrointestinal: Negative for nausea, diarrhea and constipation.  Genitourinary: Negative for urgency and frequency.  Skin: Negative for pallor or rash   Neurological: Negative for weakness, light-headedness, numbness and headaches. (no longer dizzy) Hematological: Negative for adenopathy. Does not bruise/bleed easily.  Psychiatric/Behavioral: Negative for dysphoric mood. The patient is not nervous/anxious.          Objective:   Physical Exam  Constitutional: She appears well-developed and well-nourished. No distress.       Mildly overwt and well appearing   HENT:  Head: Normocephalic and atraumatic.  Right Ear: External ear normal.  Left Ear: External ear normal.  Nose: Nose normal.  Mouth/Throat: Oropharynx is clear and moist.  Eyes: Conjunctivae and EOM are normal. Pupils are equal, round, and reactive to light. No scleral icterus.  Neck: Normal range of motion. Neck supple. No JVD present. Carotid bruit is not present. No thyromegaly present.  Cardiovascular: Normal rate, regular rhythm, normal heart sounds and intact distal pulses.  Exam reveals no gallop.   Pulmonary/Chest: Effort normal and breath sounds normal. No respiratory distress. She has no wheezes.  Abdominal: Soft. Bowel sounds are normal. She exhibits no distension, no abdominal bruit and no mass. There is no tenderness.  Genitourinary:       Had double mastectomy  Musculoskeletal: Normal range of motion. She exhibits no edema and no tenderness.  Lymphadenopathy:    She has no cervical adenopathy.  Neurological: She is alert. She has normal reflexes. No cranial nerve deficit. She exhibits normal muscle tone. Coordination normal.  Skin: Skin is warm and dry. No rash noted. No erythema. No pallor.    Psychiatric: She has a normal mood and affect.          Assessment & Plan:

## 2011-11-11 ENCOUNTER — Other Ambulatory Visit: Payer: Self-pay | Admitting: Family Medicine

## 2011-11-12 NOTE — Telephone Encounter (Signed)
[facility] refill Omeprazole 20 mg #30 x 11.

## 2011-11-30 ENCOUNTER — Ambulatory Visit (INDEPENDENT_AMBULATORY_CARE_PROVIDER_SITE_OTHER)
Admission: RE | Admit: 2011-11-30 | Discharge: 2011-11-30 | Disposition: A | Discharge status: Home / Self Care | Payer: PRIVATE HEALTH INSURANCE | Source: Ambulatory Visit

## 2011-11-30 DIAGNOSIS — M810 Age-related osteoporosis without current pathological fracture: Secondary | ICD-10-CM

## 2011-12-09 ENCOUNTER — Telehealth: Payer: Self-pay | Admitting: Family Medicine

## 2011-12-09 MED ORDER — ZOSTER VACCINE LIVE 19400 UNT/0.65ML ~~LOC~~ SOLR: 0.6500 mL | Freq: Once | SUBCUTANEOUS | Status: AC

## 2011-12-09 NOTE — Telephone Encounter (Signed)
Px was sent electronically 

## 2011-12-09 NOTE — Telephone Encounter (Signed)
Patient walked in and asked for a prescription to be called in to [facility] on [address] [place]). The prescription is Zostavax. The best number to contact her if there is any questions is [phone]

## 2011-12-09 NOTE — Telephone Encounter (Signed)
Px was sent  

## 2011-12-10 NOTE — Telephone Encounter (Signed)
Patient notified as instructed by telephone v/m.  

## 2011-12-13 ENCOUNTER — Other Ambulatory Visit: Payer: Self-pay | Admitting: Family Medicine

## 2011-12-13 ENCOUNTER — Encounter: Payer: Self-pay | Admitting: Family Medicine

## 2011-12-13 NOTE — Telephone Encounter (Signed)
Patient said she received a call on Fri that the shingles prescription for Zostivax had been called into [facility] on [address] and that when she went on Saturday to get it that it was not there.  Please call and advise at [phone] [phone]

## 2011-12-13 NOTE — Telephone Encounter (Signed)
Left v/m for pt to call back. Called in Zostavax to [facility] spoke with [place].

## 2011-12-13 NOTE — Telephone Encounter (Signed)
Patient notified as instructed by telephone. 

## 2011-12-31 ENCOUNTER — Other Ambulatory Visit: Payer: Self-pay | Admitting: Oncology

## 2011-12-31 DIAGNOSIS — C50919 Malignant neoplasm of unspecified site of unspecified female breast: Secondary | ICD-10-CM

## 2012-01-03 NOTE — Telephone Encounter (Signed)
Per 06-14-11 office note, patient to be followed here on an annual basis.  F/U Appointment to be scheduled later.

## 2012-04-28 ENCOUNTER — Other Ambulatory Visit (INDEPENDENT_AMBULATORY_CARE_PROVIDER_SITE_OTHER): Payer: Medicare Other

## 2012-04-28 DIAGNOSIS — E785 Hyperlipidemia, unspecified: Secondary | ICD-10-CM | POA: Diagnosis not present

## 2012-04-28 LAB — ALT: ALT: 40 U/L — ABNORMAL HIGH (ref 0–35)

## 2012-04-28 LAB — LIPID PANEL: Total CHOL/HDL Ratio: 4

## 2012-04-28 LAB — AST: AST: 31 U/L (ref 0–37)

## 2012-05-03 ENCOUNTER — Ambulatory Visit (INDEPENDENT_AMBULATORY_CARE_PROVIDER_SITE_OTHER): Payer: Medicare Other | Admitting: Family Medicine

## 2012-05-03 ENCOUNTER — Encounter: Payer: Self-pay | Admitting: Family Medicine

## 2012-05-03 VITALS — BP 110/64 | HR 80 | Temp 98.1°F | Ht 60.5 in | Wt 148.2 lb

## 2012-05-03 DIAGNOSIS — E785 Hyperlipidemia, unspecified: Secondary | ICD-10-CM

## 2012-05-03 DIAGNOSIS — M899 Disorder of bone, unspecified: Secondary | ICD-10-CM

## 2012-05-03 DIAGNOSIS — M8580 Other specified disorders of bone density and structure, unspecified site: Secondary | ICD-10-CM

## 2012-05-03 DIAGNOSIS — M949 Disorder of cartilage, unspecified: Secondary | ICD-10-CM | POA: Diagnosis not present

## 2012-05-03 NOTE — Assessment & Plan Note (Signed)
Lipids are improved with pravastatin  Disc goals for lipids and reasons to control them Rev labs with pt Rev low sat fat diet in detail  F/u 6 mo

## 2012-05-03 NOTE — Patient Instructions (Addendum)
Osteopenia is stable to improved Continue current medicine and ca and D Cholesterol if improved Avoid red meat/ fried foods/ egg yolks/ fatty breakfast meats/ butter, cheese and high fat dairy/ and shellfish   Continue pravastatin  F/u in 6 months for annual exam with labs prior

## 2012-05-03 NOTE — Assessment & Plan Note (Signed)
Rev dexa in detail , with osteopenia- slt imp T score -2.2 in FN No fractues On fosamax-will continue, and tamoxifen  Also disc wt bearing exercise

## 2012-05-03 NOTE — Progress Notes (Signed)
Subjective:    Patient ID: Brianna [name], female    DOB: 1946-11-08, 66 y.o.   MRN: [id number]  HPI Here for f/u of chronic conditions  Is doing well in general    Lipids On pravachol and diet  Wt is stable with bmi of 28 Working in her yard for exercise  Lab Results  Component Value Date   CHOL 158 04/28/2012   CHOL 209* 11/02/2011   CHOL 252* 08/03/2011   Lab Results  Component Value Date   HDL 41.10 04/28/2012   HDL 50.60 11/02/2011   HDL 49.80 08/03/2011   Lab Results  Component Value Date   LDLCALC 88 04/28/2012   LDLCALC 99 10/13/2009   LDLCALC 107* 10/02/2008   Lab Results  Component Value Date   TRIG 145.0 04/28/2012   TRIG 180.0* 11/02/2011   TRIG 216.0* 08/03/2011   Lab Results  Component Value Date   CHOLHDL 4 04/28/2012   CHOLHDL 4 11/02/2011   CHOLHDL 5 08/03/2011   Lab Results  Component Value Date   LDLDIRECT 136.2 11/02/2011   LDLDIRECT 185.7 08/03/2011   LDLDIRECT 162.9 04/30/2011   improved - quite a bit  No problems with the medicine  Legs hurt- but not from the medicine  Osteopenia dexa 1/13  slt improved On ca and D On fosamax  Also tamoxifen  Patient Active Problem List  Diagnosis  . BREAST CANCER  . VITAMIN D DEFICIENCY  . HYPERLIPIDEMIA  . GERD  . Osteopenia  . FATTY LIVER DISEASE, HX OF  . UNSPECIFIED ANEMIA   Past Medical History  Diagnosis Date  . GERD (gastroesophageal reflux disease)   . Hyperlipidemia   . Osteoporosis     fosamax started 9/08  . Vitamin d deficiency   . Shingles   . Allergic rhinitis   . Breast carcinoma 10/11    right- eventual double mastectomy in 2012   Past Surgical History  Procedure Date  . Partial hysterectomy 1980's    non cancer, ovaries intact  . Wrist fracture surgery   . Cataract extraction   . Breast biopsy 10/11    invasive and in situ mam carcinoma  . Breast lumpectomy 11/11  . Mastectomy 1/12    double- Dr. Dwain Sarna   History  Substance Use Topics  . Smoking status: Never  Smoker   . Smokeless tobacco: Never Used  . Alcohol Use: Yes     wine rarely   Family History  Problem Relation Age of Onset  . Dementia Mother   . Cancer Father     esophageal  . Diabetes Brother   . Cancer Paternal Aunt     ovarian   Allergies  Allergen Reactions  . Pravachol     myalgias  . Zetia (Ezetimibe)     Ineffective    Current Outpatient Prescriptions on File Prior to Visit  Medication Sig Dispense Refill  . alendronate (FOSAMAX) 70 MG tablet Take one by mouth once weekly  4 tablet  11  . Calcium Carbonate-Vitamin D (CALCIUM-VITAMIN D) 600-200 MG-UNIT CAPS Take 2 by mouth daily       . Coenzyme Q10 (CO Q-10) 200 MG CAPS Take by mouth 2 (two) times daily.        Marland Kitchen docusate sodium (COLACE) 100 MG capsule Take 100 mg by mouth as needed.       . Ergocalciferol (VITAMIN D2) 2000 UNITS TABS Take one tablet by mouth once a day.       . fexofenadine (ALLEGRA)  180 MG tablet Take otc as directed      . Flaxseed, Linseed, (FLAXSEED OIL) 1000 MG CAPS Take by mouth 3 (three) times daily.        . Multiple Vitamin (MULTIVITAMIN) tablet Take 1 tablet by mouth daily.        . NON FORMULARY Super colon cleanse 500 mg once daily in evening       . omeprazole (PRILOSEC) 20 MG capsule TAKE 1 CAPSULE BY MOUTH EVERY MORNING  30 capsule  11  . pravastatin (PRAVACHOL) 80 MG tablet Take 1 tablet (80 mg total) by mouth daily.  90 tablet  3  . Red Yeast Rice Extract (RED YEAST RICE PO) Take by mouth 2 (two) times daily.        . tamoxifen (NOLVADEX) 20 MG tablet TAKE 1 TABLET BY MOUTH DAILY  90 tablet  1      Review of Systems Review of Systems  Constitutional: Negative for fever, appetite change, fatigue and unexpected weight change.  Eyes: Negative for pain and visual disturbance.  Respiratory: Negative for cough and shortness of breath.   Cardiovascular: Negative for cp or palpitations    Gastrointestinal: Negative for nausea, diarrhea and constipation.  Genitourinary: Negative for  urgency and frequency.  Skin: Negative for pallor or rash   MSK pos for aches and pains from arthritis Neurological: Negative for weakness, light-headedness, numbness and headaches.  Hematological: Negative for adenopathy. Does not bruise/bleed easily.  Psychiatric/Behavioral: Negative for dysphoric mood. The patient is not nervous/anxious.         Objective:   Physical Exam  Constitutional: She appears well-developed and well-nourished. No distress.  HENT:  Head: Normocephalic and atraumatic.  Mouth/Throat: Oropharynx is clear and moist.  Eyes: Conjunctivae and EOM are normal. Pupils are equal, round, and reactive to light. No scleral icterus.  Neck: Normal range of motion. Neck supple. No JVD present. Carotid bruit is not present. No thyromegaly present.  Cardiovascular: Normal rate, regular rhythm, normal heart sounds and intact distal pulses.  Exam reveals no gallop.   Pulmonary/Chest: Effort normal and breath sounds normal. No respiratory distress. She has no wheezes.  Abdominal: Soft. Bowel sounds are normal. She exhibits no distension, no abdominal bruit and no mass. There is no tenderness.  Musculoskeletal: She exhibits no edema and no tenderness.       No kyphosis   Neurological: She is alert. She has normal reflexes. No cranial nerve deficit. She exhibits normal muscle tone. Coordination normal.  Skin: Skin is warm and dry. No rash noted. No pallor.  Psychiatric: She has a normal mood and affect.          Assessment & Plan:

## 2012-05-05 ENCOUNTER — Ambulatory Visit: Payer: PRIVATE HEALTH INSURANCE | Admitting: Family Medicine

## 2012-05-09 DIAGNOSIS — H43819 Vitreous degeneration, unspecified eye: Secondary | ICD-10-CM | POA: Diagnosis not present

## 2012-05-09 DIAGNOSIS — Z961 Presence of intraocular lens: Secondary | ICD-10-CM | POA: Diagnosis not present

## 2012-05-10 DIAGNOSIS — N760 Acute vaginitis: Secondary | ICD-10-CM | POA: Diagnosis not present

## 2012-05-10 DIAGNOSIS — Z01419 Encounter for gynecological examination (general) (routine) without abnormal findings: Secondary | ICD-10-CM | POA: Diagnosis not present

## 2012-05-10 DIAGNOSIS — Z0000 Encounter for general adult medical examination without abnormal findings: Secondary | ICD-10-CM | POA: Diagnosis not present

## 2012-05-11 ENCOUNTER — Telehealth: Payer: Self-pay | Admitting: Oncology

## 2012-05-11 ENCOUNTER — Encounter: Payer: Self-pay | Admitting: *Deleted

## 2012-05-11 NOTE — Telephone Encounter (Signed)
S/w the pt and she is aware of her lab and md appt for 05/16/2012

## 2012-05-16 ENCOUNTER — Ambulatory Visit (HOSPITAL_BASED_OUTPATIENT_CLINIC_OR_DEPARTMENT_OTHER): Payer: Medicare Other | Admitting: Oncology

## 2012-05-16 ENCOUNTER — Other Ambulatory Visit (HOSPITAL_BASED_OUTPATIENT_CLINIC_OR_DEPARTMENT_OTHER): Payer: Medicare Other | Admitting: Lab

## 2012-05-16 ENCOUNTER — Telehealth: Payer: Self-pay | Admitting: Oncology

## 2012-05-16 ENCOUNTER — Other Ambulatory Visit: Payer: Self-pay | Admitting: *Deleted

## 2012-05-16 VITALS — BP 117/73 | HR 78 | Temp 98.9°F | Ht 60.5 in | Wt 148.0 lb

## 2012-05-16 DIAGNOSIS — Z170 Estrogen receptor positive status [ER+]: Secondary | ICD-10-CM | POA: Diagnosis not present

## 2012-05-16 DIAGNOSIS — C50519 Malignant neoplasm of lower-outer quadrant of unspecified female breast: Secondary | ICD-10-CM

## 2012-05-16 DIAGNOSIS — C50919 Malignant neoplasm of unspecified site of unspecified female breast: Secondary | ICD-10-CM | POA: Diagnosis not present

## 2012-05-16 DIAGNOSIS — E559 Vitamin D deficiency, unspecified: Secondary | ICD-10-CM

## 2012-05-16 DIAGNOSIS — C50419 Malignant neoplasm of upper-outer quadrant of unspecified female breast: Secondary | ICD-10-CM | POA: Diagnosis not present

## 2012-05-16 DIAGNOSIS — R32 Unspecified urinary incontinence: Secondary | ICD-10-CM

## 2012-05-16 LAB — CBC WITH DIFFERENTIAL/PLATELET
BASO%: 0.3 % (ref 0.0–2.0)
Eosinophils Absolute: 0.1 10*3/uL (ref 0.0–0.5)
MONO#: 0.5 10*3/uL (ref 0.1–0.9)
NEUT#: 4.4 10*3/uL (ref 1.5–6.5)
RBC: 4.54 10*6/uL (ref 3.70–5.45)
RDW: 13.8 % (ref 11.2–14.5)
WBC: 6.9 10*3/uL (ref 3.9–10.3)

## 2012-05-16 MED ORDER — TAMOXIFEN CITRATE 20 MG PO TABS: 20.0000 mg | ORAL_TABLET | Freq: Every day | ORAL | Status: DC

## 2012-05-16 NOTE — Progress Notes (Signed)
ID: MILLER EDGINGTON   DOB: Aug 27, 1946  MR#: [id number]  WUJ#:[id number]  HISTORY OF PRESENT ILLNESS: Brianna [name] had routine screening mammography September 30th, 2011 suggesting an abnormality on the right side.  Additional views showed an area of architectural distortion in the upper outer quadrant with associated microcalcifications.  Ultrasound showed an 8 mm mass in this area and she underwent ultrasound-guided biopsy of the right breast mass October 18th. This showed invasive lobular breast cancer associated with a radial scar.  The tumor was Her-2 positive at 97%, PR positive at 12%, with a borderline proliferation fraction at 17% and no amplification of Her-2 by CISH with a ratio of 1.16.    The patient was then referred to Dr. Dwain Sarna and bilateral breast MRIs were obtained October 25th.  This showed the spiculated enhancing mass in the upper outer quadrant of the right breast which had been biopsied.  Posterior and lateral to that mass, however, there was an area of linear nodular enhancement.  Then in the left breast, in the lower outer quadrant, there was another enhancing mass measuring 1.1 cm.    Accordingly on November 2nd, the patient had an MRI-guided biopsy of the mass in the left breast. This proved to be a complex sclerosing lesion without atypia  ([id number]).  With this information, the patient proceeded to removal of the left-sided sclerosing lesion as well as right lumpectomy and sentinel lymph node sampling on October 07, 2010.  The results of this procedure ([id number]) showed on the left side a radial scar with focal atypia but no evidence of carcinoma and negative margins.  On the right side, there was a 2.3 cm invasive lobular carcinoma (E-cadherin negative), Grade 2, extending to the inferior margin.  There was no evidence of angiolymphatic invasion.  The two sentinel lymph nodes on the right were negative.  Her-2 was repeated on this invasive sample and again, no amplification  was noted.  Her subsequent history is as detailed below.  INTERVAL HISTORY: Brianna [name] returns today for followup of her breast cancer. She missed her appointment here in November because of the new computer system. 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 Most days she wakes a 5:45 in the morning. Her 15-year-old granddaughter shows up at 6:30, she gets her ready for school and drives her there. On the weekend she works in her yard. What she is not doing is walking on a regular basis.  REVIEW OF SYSTEMS: She still is having hot flashes, but does not want to take anything for them. She occasionally gets pains across her anterior chest where she had her surgeries, and when that happens she lies down and things get better. She has minimal sinus symptoms. She has persistent urinary incontinence despite her prior "sling" surgery. We discussed possible referral to a urologist but she does not want 1 at present. Otherwise a detailed review of systems was noncontributory  PAST MEDICAL HISTORY: Past Medical History  Diagnosis Date  . GERD (gastroesophageal reflux disease)   . Hyperlipidemia   . Osteoporosis     fosamax started 9/08  . Vitamin d deficiency   . Shingles   . Allergic rhinitis   . Breast carcinoma 10/11    right- eventual double mastectomy in 2012  hypercholesterolemia, history of remote migraines, history of GERD, history of bilateral cataract surgery and history of simple hysterectomy.   PAST SURGICAL HISTORY: Past Surgical History  Procedure Date  . Partial hysterectomy  1980's    non cancer, ovaries intact  . Wrist fracture surgery   . Cataract extraction   . Breast biopsy 10/11    invasive and in situ mam carcinoma  . Breast lumpectomy 11/11  . Mastectomy 1/12    double- Dr. Dwain Sarna    FAMILY HISTORY Family History  Problem Relation Age of Onset  . Dementia Mother   . Cancer Father     esophageal  . Diabetes Brother    . Cancer Paternal Aunt     ovarian  The patient's father died at the age of 68 from lung cancer in the setting of COPD.  The patient's mother died with Alzheimer's disease at 68.  The patient had four brothers, no sisters.  There is no breast cancer in the family but the patient's father had 5 sisters, one of whom had ovarian cancer in her seventies.    GYNECOLOGIC HISTORY: She is GX P1, first pregnancy to term at 66.  She underwent hysterectomy in 1989. She took hormones starting at age 66 and only took them for 2 years.  SOCIAL HISTORY: She used to work for [employer]. 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  She is divorced and lives by herself.  Her daughter [name] lives not far from her, also in [place].  [name] works in data entry for the [employer]. The patient has one granddaughter. The patient is a [place].     ADVANCED DIRECTIVES: not in place  HEALTH MAINTENANCE: History  Substance Use Topics  . Smoking status: Never Smoker   . Smokeless tobacco: Never Used  . Alcohol Use: Yes     wine rarely     Colonoscopy:  PAP:  Bone density:  Lipid panel:  Allergies  Allergen Reactions  . Pravachol     myalgias  . Zetia (Ezetimibe)     Ineffective     Current Outpatient Prescriptions  Medication Sig Dispense Refill  . alendronate (FOSAMAX) 70 MG tablet Take one by mouth once weekly  4 tablet  11  . Calcium Carbonate-Vitamin D (CALCIUM-VITAMIN D) 600-200 MG-UNIT CAPS Take 2 by mouth daily       . Coenzyme Q10 (CO Q-10) 200 MG CAPS Take by mouth 2 (two) times daily.        Marland Kitchen docusate sodium (COLACE) 100 MG capsule Take 100 mg by mouth as needed.       . Ergocalciferol (VITAMIN D2) 2000 UNITS TABS Take one tablet by mouth once a day.       . fexofenadine (ALLEGRA) 180 MG tablet Take otc as directed      . Flaxseed, Linseed, (FLAXSEED OIL) 1000 MG CAPS Take by mouth 3 (three) times daily.         . Multiple Vitamin (MULTIVITAMIN) tablet Take 1 tablet by mouth daily.        . NON FORMULARY Super colon cleanse 500 mg once daily in evening       . omeprazole (PRILOSEC) 20 MG capsule TAKE 1 CAPSULE BY MOUTH EVERY MORNING  30 capsule  11  . pravastatin (PRAVACHOL) 80 MG tablet Take 1 tablet (80 mg total) by mouth daily.  90 tablet  3  . Red Yeast Rice Extract (RED YEAST RICE PO) Take by mouth 2 (two) times daily.        . tamoxifen (NOLVADEX) 20 MG tablet TAKE 1 TABLET BY MOUTH DAILY  90 tablet  1  OBJECTIVE: Middle-aged white woman in no acute distress Filed Vitals:   05/16/12 1326  BP: 117/73  Pulse: 78  Temp: 98.9 F (37.2 C)     Body mass index is 28.43 kg/(m^2).    ECOG FS: 0  Sclerae unicteric Oropharynx clear No cervical or supraclavicular adenopathy Lungs no rales or rhonchi Heart regular rate and rhythm Abd benign MSK no focal spinal tenderness, almost imperceptible right upper extremity edema Neuro: nonfocal Breasts: Status post bilateral mastectomies. No evidence of local recurrence  LAB RESULTS: Lab Results  Component Value Date   WBC 6.9 05/16/2012   NEUTROABS 4.4 05/16/2012   HGB 12.9 05/16/2012   HCT 39.5 05/16/2012   MCV 87.0 05/16/2012   PLT 271 05/16/2012      Chemistry      Component Value Date/Time   NA 145 11/02/2011 0840   K 4.2 11/02/2011 0840   CL 108 11/02/2011 0840   CO2 25 11/02/2011 0840   BUN 19 11/02/2011 0840   CREATININE 1.1 11/02/2011 0840      Component Value Date/Time   CALCIUM 9.3 11/02/2011 0840   ALKPHOS 39 11/02/2011 0840   AST 31 04/28/2012 0816   ALT 40* 04/28/2012 0816   BILITOT 0.6 11/02/2011 0840       Lab Results  Component Value Date   LABCA2 17 10/02/2010    No components found with this basename: [id number]    No results found for this basename: INR:1;PROTIME:1 in the last 168 hours  Urinalysis No results found for this basename: colorurine, appearanceur, labspec, phurine, glucoseu, hgbur, bilirubinur,  ketonesur, proteinur, urobilinogen, nitrite, leukocytesur    STUDIES: No new results found. Bone density January 2013 showed significant osteopenia  ASSESSMENT: 66 y.o. [place] woman status post right lumpectomy and sentinel lymph node dissection November 2011 with a positive margin.  These were not cleared with further attempts at margin clearance in December and the patient subsequently underwent definitive bilateral mastectomies on 12/02/2010 with no evidence of residual disease.  Tumor was a T2 N0, stage IIA invasive lobular carcinoma, grade 2, ER positive 97%, PR positive 12%, HER-2/neu negative with MIB-1 of 17%.  Did not receive post mastectomy radiation, began on tamoxifen at 20 mg daily in early February 2012.  Tolerates the tamoxifen well.     PLAN: The plan is to continue antiestrogens for a total of 10 years, and when she finishes 5 years of tamoxifen we will decide whether she wants to continue another 5 years or switch to letrozole. She will see [place] again  one year from now. She will see Dr. Dwain Sarna in 6 months. I have strongly encouraged her to start a walking program. Otherwise she knows to call for any problems that may develop before the next visit.   Brianna [name] C    05/16/2012

## 2012-05-16 NOTE — Telephone Encounter (Signed)
lmonvm adviisng the pt of her June 2014 appt. Made the pt aware that she will receive a call from dr Doreen Salvage office when their schedule is available for a 6 month appt.

## 2012-05-17 LAB — COMPREHENSIVE METABOLIC PANEL
ALT: 35 U/L (ref 0–35)
Alkaline Phosphatase: 42 U/L (ref 39–117)
CO2: 28 mEq/L (ref 19–32)
Sodium: 141 mEq/L (ref 135–145)
Total Bilirubin: 0.4 mg/dL (ref 0.3–1.2)
Total Protein: 6 g/dL (ref 6.0–8.3)

## 2012-07-05 ENCOUNTER — Ambulatory Visit: Payer: Medicare Other | Admitting: Family Medicine

## 2012-07-05 ENCOUNTER — Ambulatory Visit (INDEPENDENT_AMBULATORY_CARE_PROVIDER_SITE_OTHER): Payer: Medicare Other | Admitting: Family Medicine

## 2012-07-05 ENCOUNTER — Encounter: Payer: Self-pay | Admitting: Family Medicine

## 2012-07-05 VITALS — BP 116/72 | HR 88 | Temp 98.1°F | Ht 61.0 in | Wt 148.5 lb

## 2012-07-05 DIAGNOSIS — J209 Acute bronchitis, unspecified: Secondary | ICD-10-CM

## 2012-07-05 DIAGNOSIS — B9689 Other specified bacterial agents as the cause of diseases classified elsewhere: Secondary | ICD-10-CM | POA: Insufficient documentation

## 2012-07-05 DIAGNOSIS — J208 Acute bronchitis due to other specified organisms: Secondary | ICD-10-CM | POA: Insufficient documentation

## 2012-07-05 MED ORDER — AZITHROMYCIN 250 MG PO TABS: ORAL_TABLET | ORAL | Status: AC

## 2012-07-05 MED ORDER — BENZONATATE 200 MG PO CAPS: 200.0000 mg | ORAL_CAPSULE | Freq: Three times a day (TID) | ORAL | Status: AC | PRN

## 2012-07-05 NOTE — Assessment & Plan Note (Signed)
With cough prod and malaise  In light of severity of cough /quick onset- will cover with zpak Also tessalon for cough and mucinex Disc symptomatic care - see instructions on AVS  Update if not starting to improve in a week or if worsening

## 2012-07-05 NOTE — Patient Instructions (Addendum)
Drink lots of fluids Try to get some rest  Take zithromax for bronchitis Try the tessalon for cough Also mucinex over the counter to loosen phlegm Update if not starting to improve in a week or if worsening

## 2012-07-05 NOTE — Progress Notes (Signed)
Subjective:    Patient ID: Brianna [name], female    DOB: 09-21-1946, 66 y.o.   MRN: [id number]  HPI Here for bronchitis symptoms  Started sat with a mild cough  Sunday much worse- burning in throat and chest Prod of mucous -- green/ yellow  Some wheezing  Harsh cough  No fever  Also a runny nose- ears are ok    Tried a multi symptom cold med  Patient Active Problem List  Diagnosis  . BREAST CANCER  . VITAMIN D DEFICIENCY  . HYPERLIPIDEMIA  . GERD  . Osteopenia  . FATTY LIVER DISEASE, HX OF  . UNSPECIFIED ANEMIA   Past Medical History  Diagnosis Date  . GERD (gastroesophageal reflux disease)   . Hyperlipidemia   . Osteoporosis     fosamax started 9/08  . Vitamin d deficiency   . Shingles   . Allergic rhinitis   . Breast carcinoma 10/11    right- eventual double mastectomy in 2012   Past Surgical History  Procedure Date  . Partial hysterectomy 1980's    non cancer, ovaries intact  . Wrist fracture surgery   . Cataract extraction   . Breast biopsy 10/11    invasive and in situ mam carcinoma  . Breast lumpectomy 11/11  . Mastectomy 1/12    double- Dr. Dwain Sarna   History  Substance Use Topics  . Smoking status: Never Smoker   . Smokeless tobacco: Never Used  . Alcohol Use: Yes     wine rarely   Family History  Problem Relation Age of Onset  . Dementia Mother   . Cancer Father     esophageal  . Diabetes Brother   . Cancer Paternal Aunt     ovarian   Allergies  Allergen Reactions  . Pravachol     myalgias  . Zetia (Ezetimibe)     Ineffective    Current Outpatient Prescriptions on File Prior to Visit  Medication Sig Dispense Refill  . alendronate (FOSAMAX) 70 MG tablet Take one by mouth once weekly  4 tablet  11  . Calcium Carbonate-Vitamin D (CALCIUM-VITAMIN D) 600-200 MG-UNIT CAPS Take 2 by mouth daily       . Coenzyme Q10 (CO Q-10) 200 MG CAPS Take by mouth 2 (two) times daily.        Marland Kitchen docusate sodium (COLACE) 100 MG capsule Take 100 mg by  mouth as needed.       . Ergocalciferol (VITAMIN D2) 2000 UNITS TABS Take one tablet by mouth once a day.       . fexofenadine (ALLEGRA) 180 MG tablet Take otc as directed      . Flaxseed, Linseed, (FLAXSEED OIL) 1000 MG CAPS Take by mouth 3 (three) times daily.        . Multiple Vitamin (MULTIVITAMIN) tablet Take 1 tablet by mouth daily.        . NON FORMULARY Super colon cleanse 500 mg once daily in evening       . omeprazole (PRILOSEC) 20 MG capsule TAKE 1 CAPSULE BY MOUTH EVERY MORNING  30 capsule  11  . pravastatin (PRAVACHOL) 80 MG tablet Take 1 tablet (80 mg total) by mouth daily.  90 tablet  3  . Red Yeast Rice Extract (RED YEAST RICE PO) Take by mouth 2 (two) times daily.        . tamoxifen (NOLVADEX) 20 MG tablet Take 1 tablet (20 mg total) by mouth daily.  90 tablet  4  Review of Systems    Review of Systems  Constitutional: Negative for fever, appetite change, fatigue and unexpected weight change.  Eyes: Negative for pain and visual disturbance.  ENT pos for st and runny nose, neg for sinus pain  Respiratory: Negative for sob Cardiovascular: Negative for cp or palpitations    Gastrointestinal: Negative for nausea, diarrhea and constipation.  Genitourinary: Negative for urgency and frequency.  Skin: Negative for pallor or rash   Neurological: Negative for weakness, light-headedness, numbness and headaches.  Hematological: Negative for adenopathy. Does not bruise/bleed easily.  Psychiatric/Behavioral: Negative for dysphoric mood. The patient is not nervous/anxious.       Objective:   Physical Exam  Constitutional: She appears well-developed and well-nourished. No distress.       fatigued  HENT:  Head: Normocephalic and atraumatic.  Right Ear: External ear normal.  Left Ear: External ear normal.  Mouth/Throat: Oropharynx is clear and moist.       Nares are injected and congested   No facial tenderness  Eyes: Conjunctivae and EOM are normal. Pupils are equal,  round, and reactive to light. Right eye exhibits no discharge. Left eye exhibits no discharge.  Neck: Normal range of motion. Neck supple.  Cardiovascular: Normal rate and regular rhythm.   Pulmonary/Chest: Effort normal and breath sounds normal. No respiratory distress. She has no wheezes. She has no rales.       bs are harsh at bases Few rhonchi at bases also   Musculoskeletal: She exhibits no edema.  Lymphadenopathy:    She has no cervical adenopathy.  Neurological: She is alert.  Skin: Skin is warm and dry. No rash noted.  Psychiatric: She has a normal mood and affect.          Assessment & Plan:

## 2012-09-12 ENCOUNTER — Other Ambulatory Visit: Payer: Self-pay | Admitting: *Deleted

## 2012-09-12 MED ORDER — ALENDRONATE SODIUM 70 MG PO TABS: ORAL_TABLET | ORAL | Status: DC

## 2012-10-23 ENCOUNTER — Ambulatory Visit (INDEPENDENT_AMBULATORY_CARE_PROVIDER_SITE_OTHER): Payer: Medicare Other | Admitting: General Surgery

## 2012-10-23 ENCOUNTER — Encounter (INDEPENDENT_AMBULATORY_CARE_PROVIDER_SITE_OTHER): Payer: Self-pay | Admitting: General Surgery

## 2012-10-23 VITALS — BP 116/88 | HR 82 | Temp 98.2°F | Ht 61.0 in | Wt 143.6 lb

## 2012-10-23 DIAGNOSIS — Z853 Personal history of malignant neoplasm of breast: Secondary | ICD-10-CM

## 2012-10-23 NOTE — Progress Notes (Signed)
Subjective:     Patient ID: Brianna [name], female   DOB: 1946/07/13, 66 y.o.   MRN: [id number]  HPI This is a 66 yof presents for followup after eventually undergoing bilateral mastectomies and a right axillary sentinel node biopsy. This was for a stage IIA right breast cancer. She is now on tamoxifen and is tolerating that well. She has missed appointments I think due to the fact that we start of the computer system and she was a little bit lost to followup.  She has been seen by medical oncology in June of 2013. She is doing well and really reports no significant complaints at this point. She has not described any concerns were added or mastectomies. She does not report any symptoms of lymphedema at this point all either. She has had no significant health history since our last visit all either.  Review of Systems  Constitutional: Negative for fever, chills and unexpected weight change.  HENT: Negative for hearing loss, congestion, sore throat, trouble swallowing and voice change.   Eyes: Negative for visual disturbance.  Respiratory: Negative for cough and wheezing.   Cardiovascular: Negative for chest pain, palpitations and leg swelling.  Gastrointestinal: Negative for nausea, vomiting, abdominal pain, diarrhea, constipation, blood in stool, abdominal distention and anal bleeding.  Genitourinary: Negative for hematuria, vaginal bleeding and difficulty urinating.  Musculoskeletal: Negative for arthralgias.  Skin: Negative for rash and wound.  Neurological: Negative for seizures, syncope and headaches.  Hematological: Negative for adenopathy. Does not bruise/bleed easily.  Psychiatric/Behavioral: Negative for confusion.       Objective:   Physical Exam  Vitals reviewed. Constitutional: She appears well-developed and well-nourished.  Pulmonary/Chest: Right breast exhibits no mass. Left breast exhibits no mass.    Lymphadenopathy:    She has no cervical adenopathy.    She has no  axillary adenopathy.       Right: No supraclavicular adenopathy present.       Left: No supraclavicular adenopathy present.       Assessment:     History breast cancer    Plan:     We discussed the need to followup for her six-month exams. I will plan on seeing her annual he along with Dr. Darnelle Catalan we'll end up seen her every 6 months. I did tell her today that she does need to continue her own self exams. I did explain to her again that there certainly is a risk of having a recurrence although that is not very high given her choice of surgery. She has no clinical evidence of a recurrence right now. We did discuss a referral to plastic surgery at her request. She would like to at least discussed with them the possibilities of her reconstruction. I will plan on seeing her back in a year unless she needs something sooner.

## 2012-10-29 ENCOUNTER — Telehealth: Payer: Self-pay | Admitting: Family Medicine

## 2012-10-29 DIAGNOSIS — Z8719 Personal history of other diseases of the digestive system: Secondary | ICD-10-CM

## 2012-10-29 DIAGNOSIS — D649 Anemia, unspecified: Secondary | ICD-10-CM

## 2012-10-29 DIAGNOSIS — M8580 Other specified disorders of bone density and structure, unspecified site: Secondary | ICD-10-CM

## 2012-10-29 DIAGNOSIS — K219 Gastro-esophageal reflux disease without esophagitis: Secondary | ICD-10-CM

## 2012-10-29 DIAGNOSIS — E785 Hyperlipidemia, unspecified: Secondary | ICD-10-CM

## 2012-10-29 DIAGNOSIS — E559 Vitamin D deficiency, unspecified: Secondary | ICD-10-CM

## 2012-10-29 NOTE — Telephone Encounter (Signed)
Message copied by Judy Pimple on Sun Oct 29, 2012  5:07 PM ------      Message from: Baldomero Lamy      Created: Wed Oct 25, 2012  1:33 PM      Regarding: Cpx labs Mon 10/30/12       Please order  future cpx labs for pt's upcomming lab appt.      Thanks      Rodney Booze

## 2012-10-30 ENCOUNTER — Other Ambulatory Visit (INDEPENDENT_AMBULATORY_CARE_PROVIDER_SITE_OTHER): Payer: Medicare Other

## 2012-10-30 DIAGNOSIS — M949 Disorder of cartilage, unspecified: Secondary | ICD-10-CM | POA: Diagnosis not present

## 2012-10-30 DIAGNOSIS — E559 Vitamin D deficiency, unspecified: Secondary | ICD-10-CM | POA: Diagnosis not present

## 2012-10-30 DIAGNOSIS — Z8719 Personal history of other diseases of the digestive system: Secondary | ICD-10-CM

## 2012-10-30 DIAGNOSIS — D649 Anemia, unspecified: Secondary | ICD-10-CM

## 2012-10-30 DIAGNOSIS — K219 Gastro-esophageal reflux disease without esophagitis: Secondary | ICD-10-CM | POA: Diagnosis not present

## 2012-10-30 DIAGNOSIS — M899 Disorder of bone, unspecified: Secondary | ICD-10-CM | POA: Diagnosis not present

## 2012-10-30 DIAGNOSIS — M8580 Other specified disorders of bone density and structure, unspecified site: Secondary | ICD-10-CM

## 2012-10-30 DIAGNOSIS — E785 Hyperlipidemia, unspecified: Secondary | ICD-10-CM | POA: Diagnosis not present

## 2012-10-30 LAB — CBC WITH DIFFERENTIAL/PLATELET
Basophils Relative: 0.4 % (ref 0.0–3.0)
Eosinophils Absolute: 0.1 10*3/uL (ref 0.0–0.7)
Eosinophils Relative: 1 % (ref 0.0–5.0)
HCT: 41.8 % (ref 36.0–46.0)
Lymphs Abs: 2 10*3/uL (ref 0.7–4.0)
MCHC: 33.1 g/dL (ref 30.0–36.0)
MCV: 87.2 fl (ref 78.0–100.0)
Monocytes Absolute: 0.5 10*3/uL (ref 0.1–1.0)
Neutro Abs: 4.1 10*3/uL (ref 1.4–7.7)
RBC: 4.8 Mil/uL (ref 3.87–5.11)
WBC: 6.7 10*3/uL (ref 4.5–10.5)

## 2012-10-30 LAB — LIPID PANEL
Cholesterol: 184 mg/dL (ref 0–200)
HDL: 43.6 mg/dL (ref >39.00)
Total CHOL/HDL Ratio: 4
Triglycerides: 205 mg/dL — ABNORMAL HIGH (ref 0.0–149.0)
VLDL: 41 mg/dL — ABNORMAL HIGH (ref 0.0–40.0)

## 2012-10-30 LAB — COMPREHENSIVE METABOLIC PANEL
AST: 34 U/L (ref 0–37)
Albumin: 4.2 g/dL (ref 3.5–5.2)
Alkaline Phosphatase: 47 U/L (ref 39–117)
BUN: 10 mg/dL (ref 6–23)
Creatinine, Ser: 1 mg/dL (ref 0.4–1.2)
Glucose, Bld: 103 mg/dL — ABNORMAL HIGH (ref 70–99)
Total Bilirubin: 0.4 mg/dL (ref 0.3–1.2)

## 2012-10-31 DIAGNOSIS — Z853 Personal history of malignant neoplasm of breast: Secondary | ICD-10-CM | POA: Diagnosis not present

## 2012-11-06 ENCOUNTER — Encounter: Payer: Self-pay | Admitting: Family Medicine

## 2012-11-06 ENCOUNTER — Ambulatory Visit (INDEPENDENT_AMBULATORY_CARE_PROVIDER_SITE_OTHER): Payer: Medicare Other | Admitting: Family Medicine

## 2012-11-06 VITALS — BP 130/68 | HR 78 | Temp 98.2°F | Ht 61.0 in | Wt 142.8 lb

## 2012-11-06 DIAGNOSIS — E785 Hyperlipidemia, unspecified: Secondary | ICD-10-CM

## 2012-11-06 DIAGNOSIS — E559 Vitamin D deficiency, unspecified: Secondary | ICD-10-CM | POA: Diagnosis not present

## 2012-11-06 DIAGNOSIS — M949 Disorder of cartilage, unspecified: Secondary | ICD-10-CM | POA: Diagnosis not present

## 2012-11-06 DIAGNOSIS — Z23 Encounter for immunization: Secondary | ICD-10-CM | POA: Diagnosis not present

## 2012-11-06 DIAGNOSIS — Z1331 Encounter for screening for depression: Secondary | ICD-10-CM | POA: Diagnosis not present

## 2012-11-06 DIAGNOSIS — M8580 Other specified disorders of bone density and structure, unspecified site: Secondary | ICD-10-CM

## 2012-11-06 DIAGNOSIS — Z8719 Personal history of other diseases of the digestive system: Secondary | ICD-10-CM | POA: Diagnosis not present

## 2012-11-06 DIAGNOSIS — M899 Disorder of bone, unspecified: Secondary | ICD-10-CM

## 2012-11-06 MED ORDER — ALENDRONATE SODIUM 70 MG PO TABS: ORAL_TABLET | ORAL | Status: DC

## 2012-11-06 MED ORDER — OMEPRAZOLE 20 MG PO CPDR: 20.0000 mg | DELAYED_RELEASE_CAPSULE | Freq: Every day | ORAL | Status: DC

## 2012-11-06 MED ORDER — PRAVASTATIN SODIUM 80 MG PO TABS: 80.0000 mg | ORAL_TABLET | Freq: Every day | ORAL | Status: DC

## 2012-11-06 NOTE — Patient Instructions (Signed)
Flu vaccine today  Start taking your vitamin D regularly  Keep active  Cholesterol is stable (Avoid red meat/ fried foods/ egg yolks/ fatty breakfast meats/ butter, cheese and high fat dairy/ and shellfish  )

## 2012-11-06 NOTE — Progress Notes (Signed)
Subjective:    Patient ID: Brianna [name], female    DOB: [birth year], 66 y.o.   MRN: [id number]  HPI Here for health maintenance exam and to review chronic medical problems    Is feeling good overall -no complaints   Wt is stable with bmi of 26  Flu vaccine-has not had one yet - if we have one left   Zoster status-- has her vaccine already in 2012   No mammogram-has had mastectomy double in past   Also part hysterectomy - saw gyn in June , and doing pap about every 5 years -- last pap was 2012 summer   9/10 colonosc - is up to date   Osteopenia 1/13 dexa On fosamax--has been on that less than 5 years  Also tamoxifen- about 3 years yet to go on that  D level 30 - she has not been taking D compliantly- will try to do better  D def in past  fx-none   Fall hx none!- balance is good   Mood -overall good , no depression and has been motivated   Lab Results  Component Value Date   CHOL 184 10/30/2012   CHOL 158 04/28/2012   CHOL 209* 11/02/2011   Lab Results  Component Value Date   HDL 43.60 10/30/2012   HDL 40.98 04/28/2012   HDL 50.60 11/02/2011   Lab Results  Component Value Date   LDLCALC 88 04/28/2012   LDLCALC 99 10/13/2009   LDLCALC 107* 10/02/2008   Lab Results  Component Value Date   TRIG 205.0* 10/30/2012   TRIG 145.0 04/28/2012   TRIG 180.0* 11/02/2011   Lab Results  Component Value Date   CHOLHDL 4 10/30/2012   CHOLHDL 4 04/28/2012   CHOLHDL 4 11/02/2011   Lab Results  Component Value Date   LDLDIRECT 119.9 10/30/2012   LDLDIRECT 136.2 11/02/2011   LDLDIRECT 185.7 08/03/2011   on pravachol Diet- overall good    Hx of fatty liver Lab Results  Component Value Date   ALT 38* 10/30/2012   AST 34 10/30/2012   ALKPHOS 47 10/30/2012   BILITOT 0.4 10/30/2012     Patient Active Problem List  Diagnosis  . BREAST CANCER  . VITAMIN D DEFICIENCY  . HYPERLIPIDEMIA  . GERD  . Osteopenia  . FATTY LIVER DISEASE, HX OF  . UNSPECIFIED ANEMIA  . Acute bacterial  bronchitis   Past Medical History  Diagnosis Date  . GERD (gastroesophageal reflux disease)   . Hyperlipidemia   . Osteoporosis     fosamax started 9/08  . Vitamin D deficiency   . Shingles   . Allergic rhinitis   . Breast carcinoma 10/11    right- eventual double mastectomy in 2012   Past Surgical History  Procedure Date  . Partial hysterectomy 1980's    non cancer, ovaries intact  . Wrist fracture surgery   . Cataract extraction   . Breast biopsy 10/11    invasive and in situ mam carcinoma  . Breast lumpectomy 11/11  . Mastectomy 1/12    double- Dr. Dwain Sarna   History  Substance Use Topics  . Smoking status: Never Smoker   . Smokeless tobacco: Never Used  . Alcohol Use: Yes     Comment: wine rarely   Family History  Problem Relation Age of Onset  . Dementia Mother   . Cancer Father     esophageal  . Diabetes Brother   . Cancer Paternal Aunt     ovarian  Allergies  Allergen Reactions  . Pravachol     myalgias  . Zetia (Ezetimibe)     Ineffective    Current Outpatient Prescriptions on File Prior to Visit  Medication Sig Dispense Refill  . alendronate (FOSAMAX) 70 MG tablet Take one by mouth once weekly  4 tablet  1  . Calcium Carbonate-Vitamin D (CALCIUM-VITAMIN D) 600-200 MG-UNIT CAPS Take 2 by mouth daily       . Coenzyme Q10 (CO Q-10) 200 MG CAPS Take by mouth 2 (two) times daily.        . fexofenadine (ALLEGRA) 180 MG tablet Take otc as directed      . Flaxseed, Linseed, (FLAXSEED OIL) 1000 MG CAPS Take by mouth 3 (three) times daily.        . Multiple Vitamin (MULTIVITAMIN) tablet Take 1 tablet by mouth daily.        . NON FORMULARY Super colon cleanse 500 mg once daily in evening       . omeprazole (PRILOSEC) 20 MG capsule TAKE 1 CAPSULE BY MOUTH EVERY MORNING  30 capsule  11  . pravastatin (PRAVACHOL) 80 MG tablet Take 1 tablet (80 mg total) by mouth daily.  90 tablet  3  . Red Yeast Rice Extract (RED YEAST RICE PO) Take by mouth 2 (two) times  daily.        . tamoxifen (NOLVADEX) 20 MG tablet Take 1 tablet (20 mg total) by mouth daily.  90 tablet  4    Review of Systems Review of Systems  Constitutional: Negative for fever, appetite change, fatigue and unexpected weight change.  Eyes: Negative for pain and visual disturbance.  Respiratory: Negative for cough and shortness of breath.   Cardiovascular: Negative for cp or palpitations    Gastrointestinal: Negative for nausea, diarrhea and constipation.  Genitourinary: Negative for urgency and frequency.  Skin: Negative for pallor or rash   Neurological: Negative for weakness, light-headedness, numbness and headaches.  Hematological: Negative for adenopathy. Does not bruise/bleed easily.  Psychiatric/Behavioral: Negative for dysphoric mood. The patient is not nervous/anxious.         Objective:   Physical Exam  Constitutional: She appears well-developed and well-nourished. No distress.  HENT:  Head: Normocephalic and atraumatic.  Right Ear: External ear normal.  Left Ear: External ear normal.  Nose: Nose normal.  Mouth/Throat: Oropharynx is clear and moist.  Eyes: Conjunctivae normal and EOM are normal. Pupils are equal, round, and reactive to light. Right eye exhibits no discharge. Left eye exhibits no discharge. No scleral icterus.  Neck: Normal range of motion. Neck supple. No JVD present. Carotid bruit is not present. No thyromegaly present.  Cardiovascular: Normal rate, regular rhythm, normal heart sounds and intact distal pulses.  Exam reveals no gallop.   Pulmonary/Chest: Effort normal and breath sounds normal. No respiratory distress. She has no wheezes.  Abdominal: Soft. Bowel sounds are normal. She exhibits no distension, no abdominal bruit and no mass. There is no tenderness.  Musculoskeletal: Normal range of motion. She exhibits no edema and no tenderness.  Lymphadenopathy:    She has no cervical adenopathy.  Neurological: She is alert. She has normal reflexes.  No cranial nerve deficit. She exhibits normal muscle tone. Coordination normal.  Skin: Skin is warm and dry. No rash noted. No erythema. No pallor.  Psychiatric: She has a normal mood and affect.          Assessment & Plan:

## 2012-11-06 NOTE — Assessment & Plan Note (Signed)
Liver tests overall stable Lab Results  Component Value Date   ALT 38* 10/30/2012   AST 34 10/30/2012   ALKPHOS 47 10/30/2012   BILITOT 0.4 10/30/2012   Disc low fat diet and wt control

## 2012-11-06 NOTE — Assessment & Plan Note (Signed)
Level is 30 but not compliant with dosing Disc imp to bone and overall health

## 2012-11-06 NOTE — Assessment & Plan Note (Signed)
On pravachol Disc goals for lipids and reasons to control them Rev labs with pt Rev low sat fat diet in detail  Disc goal of LDL under 100 -will keep working on that

## 2012-11-06 NOTE — Assessment & Plan Note (Signed)
On fosamax (with hx of breast cancer and tamoxifen) No falls or fx  Urged to get back on vitD Level is 30 now Disc imp of exercise

## 2013-05-16 ENCOUNTER — Other Ambulatory Visit: Payer: Self-pay | Admitting: Physician Assistant

## 2013-05-16 DIAGNOSIS — C50919 Malignant neoplasm of unspecified site of unspecified female breast: Secondary | ICD-10-CM

## 2013-05-17 ENCOUNTER — Telehealth: Payer: Self-pay | Admitting: *Deleted

## 2013-05-17 ENCOUNTER — Ambulatory Visit (HOSPITAL_BASED_OUTPATIENT_CLINIC_OR_DEPARTMENT_OTHER): Payer: Medicare Other | Admitting: Physician Assistant

## 2013-05-17 ENCOUNTER — Encounter: Payer: Self-pay | Admitting: Physician Assistant

## 2013-05-17 ENCOUNTER — Other Ambulatory Visit (HOSPITAL_BASED_OUTPATIENT_CLINIC_OR_DEPARTMENT_OTHER): Payer: Medicare Other | Admitting: Lab

## 2013-05-17 VITALS — BP 124/72 | HR 88 | Temp 98.3°F | Resp 20 | Ht 61.0 in | Wt 146.7 lb

## 2013-05-17 DIAGNOSIS — C50919 Malignant neoplasm of unspecified site of unspecified female breast: Secondary | ICD-10-CM

## 2013-05-17 DIAGNOSIS — Z8719 Personal history of other diseases of the digestive system: Secondary | ICD-10-CM

## 2013-05-17 LAB — COMPREHENSIVE METABOLIC PANEL (CC13)
ALT: 44 U/L (ref 0–55)
AST: 28 U/L (ref 5–34)
Albumin: 3.7 g/dL (ref 3.5–5.0)
Alkaline Phosphatase: 48 U/L (ref 40–150)
Glucose: 112 mg/dl (ref 70–140)
Potassium: 3.9 mEq/L (ref 3.5–5.1)
Sodium: 142 mEq/L (ref 136–145)
Total Protein: 6.7 g/dL (ref 6.4–8.3)

## 2013-05-17 LAB — CBC WITH DIFFERENTIAL/PLATELET
BASO%: 0.3 % (ref 0.0–2.0)
EOS%: 1.2 % (ref 0.0–7.0)
Eosinophils Absolute: 0.1 10*3/uL (ref 0.0–0.5)
MCV: 83.7 fL (ref 79.5–101.0)
MONO%: 9 % (ref 0.0–14.0)
NEUT#: 3.8 10*3/uL (ref 1.5–6.5)
RBC: 4.67 10*6/uL (ref 3.70–5.45)
RDW: 13.8 % (ref 11.2–14.5)

## 2013-05-17 MED ORDER — TAMOXIFEN CITRATE 20 MG PO TABS: 20.0000 mg | ORAL_TABLET | Freq: Every day | ORAL | Status: DC

## 2013-05-17 NOTE — Telephone Encounter (Signed)
appts made and printed...td 

## 2013-05-17 NOTE — Progress Notes (Signed)
ID: Brianna [name]   DOB: 02-Sep-1946  MR#: [id number]  NWG#:[id number]   PCP: Brianna Manns, MD GYN: SU:  Brianna Loron, MD Other:  Brianna Givens, MD   HISTORY OF PRESENT ILLNESS: Brianna [name] had routine screening mammography September 30th, 2011 suggesting an abnormality on the right side.  Additional views showed an area of architectural distortion in the upper outer quadrant with associated microcalcifications.  Ultrasound showed an 8 mm mass in this area and she underwent ultrasound-guided biopsy of the right breast mass October 18th. This showed invasive lobular breast cancer associated with a radial scar.  The tumor was Her-2 positive at 97%, PR positive at 12%, with a borderline proliferation fraction at 17% and no amplification of Her-2 by CISH with a ratio of 1.16.    The patient was then referred to Dr. Dwain [name] and bilateral breast MRIs were obtained October 25th.  This showed the spiculated enhancing mass in the upper outer quadrant of the right breast which had been biopsied.  Posterior and lateral to that mass, however, there was an area of linear nodular enhancement.  Then in the left breast, in the lower outer quadrant, there was another enhancing mass measuring 1.1 cm.    Accordingly on November 2nd, the patient had an MRI-guided biopsy of the mass in the left breast. This proved to be a complex sclerosing lesion without atypia  ([id number]).  With this information, the patient proceeded to removal of the left-sided sclerosing lesion as well as right lumpectomy and sentinel lymph node sampling on October 07, 2010.  The results of this procedure ([id number]) showed on the left side a radial scar with focal atypia but no evidence of carcinoma and negative margins.  On the right side, there was a 2.3 cm invasive lobular carcinoma (E-cadherin negative), Grade 2, extending to the inferior margin.  There was no evidence of angiolymphatic invasion.  The two sentinel lymph nodes on the  right were negative.  Her-2 was repeated on this invasive sample and again, no amplification was noted.  Her subsequent history is as detailed below.  INTERVAL HISTORY: Brianna [name] returns today accompanied by her young granddaughter, Brianna [name],  for followup of Brianna [name]'s right breast cancer. She continues on tamoxifen daily with good tolerance. She does have hot flashes, especially early in the morning, but tells me she "can tolerate them". She denies any vaginal dryness or bleeding, but occasionally has some slight clear discharge. She's had no signs of abnormal clotting. She denies any change in vision. She has some arthritic joint pain, especially in the hips. She also has some leg pain she associates with pravastatin.   Interval history is generally unremarkable. She did retire from her job at [employer], but wishes she had a part-time job.   REVIEW OF SYSTEMS: [name] has had no recent illnesses and denies any fevers, chills, or night sweats. She's had no skin changes or rashes. Her appetite is good and she denies any nausea or change in bowel habits. She's had no cough, shortness of breath, chest pain, or palpitations. She does have some occasional pain in the anterior chest wall status post bilateral mastectomies. She has had no abnormal headaches or dizziness.  A detailed review of systems is otherwise stable and noncontributory.   PAST MEDICAL HISTORY: Past Medical History  Diagnosis Date  . GERD (gastroesophageal reflux disease)   . Hyperlipidemia   . Osteoporosis     fosamax started 9/08  . Vitamin D deficiency   . Shingles   .  Allergic rhinitis   . Breast carcinoma 10/11    right- eventual double mastectomy in 2012  hypercholesterolemia, history of remote migraines, history of GERD, history of bilateral cataract surgery and history of simple hysterectomy.   PAST SURGICAL HISTORY: Past Surgical History  Procedure Laterality Date  . Partial hysterectomy  1980's    non cancer,  ovaries intact  . Wrist fracture surgery    . Cataract extraction    . Breast biopsy  10/11    invasive and in situ mam carcinoma  . Breast lumpectomy  11/11  . Mastectomy  1/12    double- Dr. Dwain [name]    FAMILY HISTORY Family History  Problem Relation Age of Onset  . Dementia Mother   . Cancer Father     esophageal  . Diabetes Brother   . Cancer Paternal Aunt     ovarian  The patient's father died at the age of 28 from lung cancer in the setting of COPD.  The patient's mother died with Alzheimer's disease at 45.  The patient had four brothers, no sisters.  There is no breast cancer in the family but the patient's father had 5 sisters, one of whom had ovarian cancer in her seventies.    GYNECOLOGIC HISTORY: She is GX P1, first pregnancy to term at 30.  She underwent hysterectomy in 1989. She took hormones starting at age 5 and only took them for 2 years.  SOCIAL HISTORY: She used to work for [employer]. 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  She is divorced and lives by herself.  Her daughter Brianna [name] lives not far from her, also in [place].  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 The patient has one granddaughter. The patient is a [place].     ADVANCED DIRECTIVES: not in place  HEALTH MAINTENANCE: History  Substance Use Topics  . Smoking status: Never Smoker   . Smokeless tobacco: Never Used  . Alcohol Use: Yes     Comment: wine rarely     Colonoscopy:  Sept 2010, Dr. Crawford [name]  PAP:  Bone density: Jan 2013 at [facility], osteopenia  Lipid panel:  [facility], Dr. Milinda Antis   Allergies  Allergen Reactions  . Pravachol     Myalgias  (As of visit on 05/17/2013, patient is taking Pravachol, but continues to have muscle pain)  . Zetia (Ezetimibe)     Ineffective     Current Outpatient Prescriptions  Medication Sig Dispense Refill  . alendronate (FOSAMAX) 70 MG tablet Take one by  mouth once weekly  12 tablet  3  . Calcium Carbonate-Vitamin D (CALCIUM-VITAMIN D) 600-200 MG-UNIT CAPS Take 2 by mouth daily       . Cholecalciferol (VITAMIN D-3) 1000 UNITS CAPS Take 1 capsule by mouth daily.      . Cinnamon 500 MG TABS Take 1 tablet by mouth.      . Coenzyme Q10 (CO Q-10) 200 MG CAPS Take by mouth 2 (two) times daily.        . fexofenadine (ALLEGRA) 180 MG tablet Take otc as directed      . Flaxseed, Linseed, (FLAXSEED OIL) 1000 MG CAPS Take by mouth 3 (three) times daily.        . Multiple Vitamin (MULTIVITAMIN) tablet Take 1 tablet by mouth daily.        . NON FORMULARY Super colon cleanse 500 mg once daily in evening       .  omeprazole (PRILOSEC) 20 MG capsule Take 1 capsule (20 mg total) by mouth daily.  90 capsule  3  . pravastatin (PRAVACHOL) 80 MG tablet Take 1 tablet (80 mg total) by mouth daily.  90 tablet  3  . Red Yeast Rice Extract (RED YEAST RICE PO) Take by mouth 2 (two) times daily.        . tamoxifen (NOLVADEX) 20 MG tablet Take 1 tablet (20 mg total) by mouth daily.  90 tablet  4   No current facility-administered medications for this visit.    OBJECTIVE: Middle-aged white woman in no acute distress Filed Vitals:   05/17/13 1206  BP: 124/72  Pulse: 88  Temp: 98.3 F (36.8 C)  Resp: 20     Body mass index is 27.73 kg/(m^2).    ECOG FS: 0 Filed Weights   05/17/13 1206  Weight: 146 lb 11.2 oz (66.543 kg)   Sclerae unicteric Oropharynx clear No cervical or supraclavicular adenopathy Lungs clear to auscultation bilaterally, no wheezes, no rales or rhonchi Heart regular rate and rhythm, no murmur Abdomen soft, nontender to palpation, positive bowel sounds, no hepatomegaly MSK no focal spinal tenderness No peripheral edema Neuro: nonfocal, well oriented with positive affect Breasts: Status post bilateral mastectomies. No evidence of local recurrence.  Axillae are benign bilaterally with no palpable adenopathy.   LAB RESULTS: Lab Results   Component Value Date   WBC 6.6 05/17/2013   NEUTROABS 3.8 05/17/2013   HGB 12.9 05/17/2013   HCT 39.1 05/17/2013   MCV 83.7 05/17/2013   PLT 262 05/17/2013      Chemistry      Component Value Date/Time   NA 142 05/17/2013 1147   NA 141 10/30/2012 0838   K 3.9 05/17/2013 1147   K 4.1 10/30/2012 0838   CL 105 10/30/2012 0838   CO2 26 05/17/2013 1147   CO2 26 10/30/2012 0838   BUN 15.4 05/17/2013 1147   BUN 10 10/30/2012 0838   CREATININE 1.0 05/17/2013 1147   CREATININE 1.0 10/30/2012 0838      Component Value Date/Time   CALCIUM 9.5 05/17/2013 1147   CALCIUM 9.2 10/30/2012 0838   ALKPHOS 48 05/17/2013 1147   ALKPHOS 47 10/30/2012 0838   AST 28 05/17/2013 1147   AST 34 10/30/2012 0838   ALT 44 05/17/2013 1147   ALT 38* 10/30/2012 0838   BILITOT 0.34 05/17/2013 1147   BILITOT 0.4 10/30/2012 0838       STUDIES:  Bone density at [facility] in January 2013 showed significant osteopenia   ASSESSMENT: 67 y.o. [place] woman   (1)  status post right lumpectomy and sentinel lymph node dissection November 2011 with a positive margin.  These were not cleared with further attempts at margin clearance in December.  (2)  The patient subsequently underwent definitive bilateral mastectomies on 12/02/2010 with no evidence of residual disease.  Tumor was a T2 N0, stage IIA invasive lobular carcinoma, grade 2, ER positive 97%, PR positive 12%, HER-2/neu negative with MIB-1 of 17%.    (3)  She did not receive post mastectomy radiation  (4)  She began on tamoxifen at 20 mg daily in early February 2012.      PLAN:  Adamae will continue on tamoxifen, and I have refilled this for her for another year.  The plan is to continue antiestrogens for a total of 10 years, and when she finishes 5 years of tamoxifen we will decide whether she wants to continue another 5 years or  switch to letrozole.   Otherwise, she will continue to followup with Dr. Milinda Antis for routine health care. She is due for her next  bone density test in January 2015 and that is usually obtained through [facility].  She'll see Dr. Dwain [name] in 6 months, and we will see her again in one year from now.  Daysha voices understanding and agreement with this plan, and knows to call with any problems prior to her next appointment.    Estaban [place]    05/17/2013

## 2013-06-21 ENCOUNTER — Telehealth (INDEPENDENT_AMBULATORY_CARE_PROVIDER_SITE_OTHER): Payer: Self-pay

## 2013-06-21 NOTE — Telephone Encounter (Signed)
Message copied by Ethlyn Gallery on Thu Jun 21, 2013  4:44 PM ------      Message from: Louie Casa      Created: Thu Jun 21, 2013  4:27 PM      Regarding: Dr. Dwain Sarna Appt Tomorrow 06/22/2013 Question      Contact: [phone]       As per patient she needs to see Dr. Dwain Sarna on [facility]) and she did not know the appt for Tomorrow, wants to know if needs to come Tomorrow, please call her.      Thank you.       Windell Moulding ------

## 2013-06-21 NOTE — Telephone Encounter (Signed)
LMOM canceling appt for 8/1 with Dr Dwain Sarna b/c his last office note states she is due for December yearly exam with him.

## 2013-06-22 ENCOUNTER — Encounter (INDEPENDENT_AMBULATORY_CARE_PROVIDER_SITE_OTHER): Payer: Medicare Other | Admitting: General Surgery

## 2013-06-27 DIAGNOSIS — H5200 Hypermetropia, unspecified eye: Secondary | ICD-10-CM | POA: Diagnosis not present

## 2013-06-27 DIAGNOSIS — H353190 Nonexudative age-related macular degeneration, unspecified eye, stage unspecified: Secondary | ICD-10-CM | POA: Diagnosis not present

## 2013-06-27 DIAGNOSIS — H52229 Regular astigmatism, unspecified eye: Secondary | ICD-10-CM | POA: Diagnosis not present

## 2013-06-27 DIAGNOSIS — H43819 Vitreous degeneration, unspecified eye: Secondary | ICD-10-CM | POA: Diagnosis not present

## 2013-07-12 ENCOUNTER — Other Ambulatory Visit: Payer: Self-pay | Admitting: Oncology

## 2013-07-12 DIAGNOSIS — C50919 Malignant neoplasm of unspecified site of unspecified female breast: Secondary | ICD-10-CM

## 2013-08-02 DIAGNOSIS — C50919 Malignant neoplasm of unspecified site of unspecified female breast: Secondary | ICD-10-CM | POA: Diagnosis not present

## 2013-08-02 DIAGNOSIS — N8111 Cystocele, midline: Secondary | ICD-10-CM | POA: Diagnosis not present

## 2013-08-02 DIAGNOSIS — Z01419 Encounter for gynecological examination (general) (routine) without abnormal findings: Secondary | ICD-10-CM | POA: Diagnosis not present

## 2013-08-29 ENCOUNTER — Ambulatory Visit (INDEPENDENT_AMBULATORY_CARE_PROVIDER_SITE_OTHER): Payer: Medicare Other | Admitting: Family Medicine

## 2013-08-29 ENCOUNTER — Encounter: Payer: Self-pay | Admitting: Family Medicine

## 2013-08-29 VITALS — BP 120/70 | HR 81 | Temp 98.2°F | Ht 61.0 in | Wt 144.5 lb

## 2013-08-29 DIAGNOSIS — Z021 Encounter for pre-employment examination: Secondary | ICD-10-CM | POA: Insufficient documentation

## 2013-08-29 DIAGNOSIS — Z0289 Encounter for other administrative examinations: Secondary | ICD-10-CM

## 2013-08-29 DIAGNOSIS — Z111 Encounter for screening for respiratory tuberculosis: Secondary | ICD-10-CM | POA: Diagnosis not present

## 2013-08-29 DIAGNOSIS — Z23 Encounter for immunization: Secondary | ICD-10-CM | POA: Diagnosis not present

## 2013-08-29 NOTE — Progress Notes (Signed)
Subjective:    Patient ID: Brianna [name], female    DOB: 1946-04-08, 67 y.o.   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 Pre-employment examination 08/29/2013  . BREAST CANCER 10/21/2010  . VITAMIN D DEFICIENCY 10/11/2008  . HYPERLIPIDEMIA 07/07/2007  . GERD 07/07/2007  . Osteopenia 07/07/2007  . FATTY LIVER DISEASE, HX OF 07/07/2007   Past Medical History  Diagnosis Date  . GERD (gastroesophageal reflux disease)   . Hyperlipidemia   . Osteoporosis     fosamax started 9/08  . Vitamin D deficiency   . Shingles   . Allergic rhinitis   . Breast carcinoma 10/11    right- eventual double mastectomy in 2012   Past Surgical History  Procedure Laterality Date  . Partial hysterectomy  1980's    non cancer, ovaries intact  . Wrist fracture surgery    . Cataract extraction    . Breast biopsy  10/11    invasive and in situ mam carcinoma  . Breast lumpectomy  11/11  . Mastectomy  1/12    double- Dr. Dwain Sarna   History  Substance Use Topics  . Smoking status: Never Smoker   . Smokeless tobacco: Never Used  . Alcohol Use: Yes     Comment: wine rarely   Family History  Problem Relation Age of Onset  . Dementia Mother   . Cancer Father     esophageal  . Diabetes Brother   . Cancer Paternal Aunt     ovarian   Allergies  Allergen Reactions  . Pravachol     Myalgias  (As of visit on 05/17/2013, patient is taking Pravachol, but continues to have muscle pain)  . Zetia [Ezetimibe]     Ineffective    Current Outpatient Prescriptions on File Prior to Visit   Medication Sig Dispense Refill  . alendronate (FOSAMAX) 70 MG tablet Take one by mouth once weekly  12 tablet  3  . Calcium Carbonate-Vitamin D (CALCIUM-VITAMIN D) 600-200 MG-UNIT CAPS Take 2 by mouth daily       . Cholecalciferol (VITAMIN D-3) 1000 UNITS CAPS Take 1 capsule by mouth daily.      . Coenzyme Q10 (CO Q-10) 200 MG CAPS Take by mouth 2 (two) times daily.        . fexofenadine (ALLEGRA) 180 MG tablet Take otc as directed      . Flaxseed, Linseed, (FLAXSEED OIL) 1000 MG CAPS Take by mouth 3 (three) times daily.        . Multiple Vitamin (MULTIVITAMIN) tablet Take 1 tablet by mouth daily.        . NON FORMULARY Super colon cleanse 500 mg once daily in evening       . omeprazole (PRILOSEC) 20 MG capsule Take 1 capsule (20 mg total) by mouth daily.  90 capsule  3  . pravastatin (PRAVACHOL) 80 MG tablet Take 1 tablet (80 mg total) by mouth daily.  90 tablet  3  . Red Yeast Rice Extract (RED YEAST RICE  PO) Take by mouth 2 (two) times daily.        . tamoxifen (NOLVADEX) 20 MG tablet TAKE 1 TABLET BY MOUTH ONCE A DAY  90 tablet  3   No current facility-administered medications on file prior to visit.     Review of Systems Review of Systems  Constitutional: Negative for fever, appetite change, fatigue and unexpected weight change.  Eyes: Negative for pain and visual disturbance.  Respiratory: Negative for cough and shortness of breath.   Cardiovascular: Negative for cp or palpitations    Gastrointestinal: Negative for nausea, diarrhea and constipation.  Genitourinary: Negative for urgency and frequency.  Skin: Negative for pallor or rash   Neurological: Negative for weakness, light-headedness, numbness and headaches.  Hematological: Negative for adenopathy. Does not bruise/bleed easily.  Psychiatric/Behavioral: Negative for dysphoric mood. The patient is not nervous/anxious.         Objective:   Physical Exam  Constitutional: She appears well-developed and well-nourished. No  distress.  HENT:  Head: Normocephalic and atraumatic.  Mouth/Throat: Oropharynx is clear and moist.  Eyes: Conjunctivae and EOM are normal. Pupils are equal, round, and reactive to light. Right eye exhibits no discharge. Left eye exhibits no discharge. No scleral icterus.  Neck: Normal range of motion. Neck supple.  Cardiovascular: Normal rate, regular rhythm and intact distal pulses.  Exam reveals no gallop.   Pulmonary/Chest: Effort normal and breath sounds normal. No respiratory distress. She has no wheezes.  Abdominal: Bowel sounds are normal.  Musculoskeletal: She exhibits no edema.  Lymphadenopathy:    She has no cervical adenopathy.  Neurological: She is alert. She has normal reflexes. No cranial nerve deficit. She exhibits normal muscle tone. Coordination normal.  Skin: Skin is warm and dry. No rash noted. No erythema. No pallor.  Psychiatric: She has a normal mood and affect.          Assessment & Plan:

## 2013-08-29 NOTE — Patient Instructions (Signed)
No restrictions for work PPD today (TB test )- come back Friday to have it read  Flu vaccine today

## 2013-08-30 ENCOUNTER — Telehealth: Payer: Self-pay | Admitting: *Deleted

## 2013-08-30 NOTE — Telephone Encounter (Signed)
Lm informed pt that GCM will be on pal 05/20/14. gv appt for 05/06/13 w/labs@ 11am. And  Ov on 05/13/14 ov@ 11am. Made pt aware that i will mail a letter/cal...td

## 2013-08-30 NOTE — Assessment & Plan Note (Signed)
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

## 2013-08-31 LAB — TB SKIN TEST: Induration: 0 mm

## 2013-09-07 ENCOUNTER — Encounter: Payer: Self-pay | Admitting: Family Medicine

## 2013-09-07 ENCOUNTER — Ambulatory Visit (INDEPENDENT_AMBULATORY_CARE_PROVIDER_SITE_OTHER): Payer: Medicare Other | Admitting: Family Medicine

## 2013-09-07 VITALS — BP 138/80 | HR 87 | Temp 97.9°F | Ht 61.0 in | Wt 144.0 lb

## 2013-09-07 DIAGNOSIS — R300 Dysuria: Secondary | ICD-10-CM

## 2013-09-07 DIAGNOSIS — N390 Urinary tract infection, site not specified: Secondary | ICD-10-CM | POA: Diagnosis not present

## 2013-09-07 LAB — POCT URINALYSIS DIPSTICK
Bilirubin, UA: NEGATIVE
Glucose, UA: NEGATIVE
Nitrite, UA: NEGATIVE
Urobilinogen, UA: 0.2
pH, UA: 6

## 2013-09-07 MED ORDER — CIPROFLOXACIN HCL 250 MG PO TABS: 250.0000 mg | ORAL_TABLET | Freq: Two times a day (BID) | ORAL | Status: DC

## 2013-09-07 NOTE — Progress Notes (Signed)
Subjective:    Patient ID: Brianna [name], female    DOB: 1946-01-26, 67 y.o.   MRN: [id number]  HPI Here for uti symptoms  Started on Monday - was on vacation in [place] some azo otc for symptoms   Burns to urinate  Bladder pain as well  Some dull ache in lower back  No n/v but had a low grade fever  Has frequency and urgency  More incontinence - had to buy depends   Patient Active Problem List   Diagnosis Date Noted  . Pre-employment examination 08/29/2013  . BREAST CANCER 10/21/2010  . VITAMIN D DEFICIENCY 10/11/2008  . HYPERLIPIDEMIA 07/07/2007  . GERD 07/07/2007  . Osteopenia 07/07/2007  . FATTY LIVER DISEASE, HX OF 07/07/2007   Past Medical History  Diagnosis Date  . GERD (gastroesophageal reflux disease)   . Hyperlipidemia   . Osteoporosis     fosamax started 9/08  . Vitamin D deficiency   . Shingles   . Allergic rhinitis   . Breast carcinoma 10/11    right- eventual double mastectomy in 2012   Past Surgical History  Procedure Laterality Date  . Partial hysterectomy  1980's    non cancer, ovaries intact  . Wrist fracture surgery    . Cataract extraction    . Breast biopsy  10/11    invasive and in situ mam carcinoma  . Breast lumpectomy  11/11  . Mastectomy  1/12    double- Dr. Dwain Sarna   History  Substance Use Topics  . Smoking status: Never Smoker   . Smokeless tobacco: Never Used  . Alcohol Use: Yes     Comment: wine rarely   Family History  Problem Relation Age of Onset  . Dementia Mother   . Cancer Father     esophageal  . Diabetes Brother   . Cancer Paternal Aunt     ovarian   Allergies  Allergen Reactions  . Pravachol     Myalgias  (As of visit on 05/17/2013, patient is taking Pravachol, but continues to have muscle pain)  . Zetia [Ezetimibe]     Ineffective    Current Outpatient Prescriptions on File Prior to Visit  Medication Sig Dispense Refill  . alendronate (FOSAMAX) 70 MG tablet Take one by mouth once weekly  12 tablet   3  . Calcium Carbonate-Vitamin D (CALCIUM-VITAMIN D) 600-200 MG-UNIT CAPS Take 2 by mouth daily       . Cholecalciferol (VITAMIN D-3) 1000 UNITS CAPS Take 1 capsule by mouth daily.      . Coenzyme Q10 (CO Q-10) 200 MG CAPS Take by mouth 2 (two) times daily.        . fexofenadine (ALLEGRA) 180 MG tablet Take otc as directed      . Flaxseed, Linseed, (FLAXSEED OIL) 1000 MG CAPS Take by mouth 3 (three) times daily.        . Multiple Vitamin (MULTIVITAMIN) tablet Take 1 tablet by mouth daily.        . NON FORMULARY Super colon cleanse 500 mg once daily in evening       . omeprazole (PRILOSEC) 20 MG capsule Take 1 capsule (20 mg total) by mouth daily.  90 capsule  3  . pravastatin (PRAVACHOL) 80 MG tablet Take 1 tablet (80 mg total) by mouth daily.  90 tablet  3  . Red Yeast Rice Extract (RED YEAST RICE PO) Take by mouth 2 (two) times daily.        Marland Kitchen  tamoxifen (NOLVADEX) 20 MG tablet TAKE 1 TABLET BY MOUTH ONCE A DAY  90 tablet  3   No current facility-administered medications on file prior to visit.      Review of Systems Review of Systems  Constitutional: Negative for , appetite change, fatigue and unexpected weight change.  Eyes: Negative for pain and visual disturbance.  Respiratory: Negative for cough and shortness of breath.   Cardiovascular: Negative for cp or palpitations    Gastrointestinal: Negative for nausea, diarrhea and constipation.  Genitourinary: pos  for urgency and frequency. neg for blood in urine or odor  Skin: Negative for pallor or rash   Neurological: Negative for weakness, light-headedness, numbness and headaches.  Hematological: Negative for adenopathy. Does not bruise/bleed easily.  Psychiatric/Behavioral: Negative for dysphoric mood. The patient is not nervous/anxious.         Objective:   Physical Exam  Constitutional: She appears well-developed and well-nourished. No distress.  HENT:  Head: Normocephalic and atraumatic.  Mouth/Throat: Oropharynx is clear  and moist.  Eyes: Conjunctivae and EOM are normal. Pupils are equal, round, and reactive to light. No scleral icterus.  Neck: Normal range of motion. Neck supple.  Cardiovascular: Normal rate, regular rhythm and intact distal pulses.  Exam reveals no gallop.   Pulmonary/Chest: Effort normal and breath sounds normal. No respiratory distress.  Abdominal: Soft. Bowel sounds are normal. She exhibits no distension, no abdominal bruit and no mass. There is tenderness in the suprapubic area. There is CVA tenderness.  Mild? L cva tenderness  Genitourinary: No breast swelling, tenderness, discharge or bleeding.  Musculoskeletal: Normal range of motion. She exhibits no edema and no tenderness.  Lymphadenopathy:    She has no cervical adenopathy.  Neurological: She is alert. She has normal reflexes. No cranial nerve deficit. She exhibits normal muscle tone. Coordination normal.  Skin: Skin is warm and dry. No rash noted. No erythema. No pallor.  Psychiatric: She has a normal mood and affect.          Assessment & Plan:

## 2013-09-07 NOTE — Patient Instructions (Signed)
Drink lots of water  Take the cipro as directed Update if not starting to improve in several days or if worsening   We will call you with a culture result

## 2013-09-09 LAB — POCT UA - MICROSCOPIC ONLY: Casts, Ur, LPF, POC: 0

## 2013-09-09 NOTE — Assessment & Plan Note (Signed)
Pos ua and symptoms  tx with cipro Sent for cx-pend Disc symptomatic care - see instructions on AVS - azo for 2 d prn  Update if not starting to improve in several days  or if worsening

## 2013-09-10 LAB — URINE CULTURE: Colony Count: >=100000

## 2013-10-23 ENCOUNTER — Ambulatory Visit (INDEPENDENT_AMBULATORY_CARE_PROVIDER_SITE_OTHER): Payer: Medicare Other | Admitting: General Surgery

## 2013-10-23 ENCOUNTER — Encounter (INDEPENDENT_AMBULATORY_CARE_PROVIDER_SITE_OTHER): Payer: Self-pay

## 2013-10-23 ENCOUNTER — Encounter (INDEPENDENT_AMBULATORY_CARE_PROVIDER_SITE_OTHER): Payer: Self-pay | Admitting: General Surgery

## 2013-10-23 VITALS — BP 126/70 | HR 70 | Resp 18 | Ht 60.0 in | Wt 146.0 lb

## 2013-10-23 DIAGNOSIS — C50919 Malignant neoplasm of unspecified site of unspecified female breast: Secondary | ICD-10-CM | POA: Diagnosis not present

## 2013-10-23 NOTE — Progress Notes (Signed)
Subjective:     Patient ID: Brianna [name], female   DOB: 01-Jun-1946, 67 y.o.   MRN: [id number]  HPI This is a 67 yof presents for followup after eventually undergoing bilateral mastectomies and a right axillary sentinel node biopsy. This was for a stage IIA right breast cancer. She is now on tamoxifen and is still tolerating that well. She thinks some of her hair loss may be associated with this. She is doing well and really reports no significant complaints at this point. She has not described any concerns at her mastectomy sites. She does not report any symptoms of lymphedema at this point all either. She has had no significant health history since our last visit all either.  Review of Systems     Objective:   Physical Exam Healed mastectomy incisions bilaterally with no masses No axillary adenopathy     Assessment:     History stage IIa breast cancer     Plan:     She is doing very well overall. She has no evidence of any recurrence clinically. She is going to do her own self exams and will continue to see a physician every 6 months. I will see her back in one year she will be seen by medical oncology 6 months from now. She's going to continue on her tamoxifen which she is tolerating pretty well. I did give her a prescription for some postmastectomy supplies again today.

## 2013-11-12 ENCOUNTER — Other Ambulatory Visit: Payer: Self-pay | Admitting: Family Medicine

## 2013-11-18 ENCOUNTER — Other Ambulatory Visit: Payer: Self-pay | Admitting: Family Medicine

## 2013-12-22 ENCOUNTER — Other Ambulatory Visit: Payer: Self-pay | Admitting: Family Medicine

## 2014-02-19 ENCOUNTER — Telehealth: Payer: Self-pay | Admitting: Oncology

## 2014-02-19 NOTE — Telephone Encounter (Signed)
june appts cxd. lmonvm for pt re new appts for aug/sept. schedule mailed.

## 2014-02-28 ENCOUNTER — Telehealth: Payer: Self-pay | Admitting: Family Medicine

## 2014-02-28 DIAGNOSIS — E785 Hyperlipidemia, unspecified: Secondary | ICD-10-CM

## 2014-02-28 DIAGNOSIS — Z0000 Encounter for general adult medical examination without abnormal findings: Secondary | ICD-10-CM | POA: Insufficient documentation

## 2014-02-28 DIAGNOSIS — M8580 Other specified disorders of bone density and structure, unspecified site: Secondary | ICD-10-CM

## 2014-02-28 DIAGNOSIS — E559 Vitamin D deficiency, unspecified: Secondary | ICD-10-CM

## 2014-02-28 DIAGNOSIS — Z8719 Personal history of other diseases of the digestive system: Secondary | ICD-10-CM

## 2014-02-28 NOTE — Telephone Encounter (Signed)
Message copied by Abner Greenspan on Thu Feb 28, 2014 10:14 PM ------      Message from: Ellamae Sia      Created: Tue Feb 19, 2014 12:38 PM      Regarding: Lab orders for Monday, 4.13.15       Patient is scheduled for CPX labs, please order future labs, Thanks , Terri       ------

## 2014-03-04 ENCOUNTER — Other Ambulatory Visit (INDEPENDENT_AMBULATORY_CARE_PROVIDER_SITE_OTHER): Payer: Medicare Other

## 2014-03-04 DIAGNOSIS — E559 Vitamin D deficiency, unspecified: Secondary | ICD-10-CM | POA: Diagnosis not present

## 2014-03-04 DIAGNOSIS — M949 Disorder of cartilage, unspecified: Secondary | ICD-10-CM

## 2014-03-04 DIAGNOSIS — Z8719 Personal history of other diseases of the digestive system: Secondary | ICD-10-CM | POA: Diagnosis not present

## 2014-03-04 DIAGNOSIS — M8580 Other specified disorders of bone density and structure, unspecified site: Secondary | ICD-10-CM

## 2014-03-04 DIAGNOSIS — M899 Disorder of bone, unspecified: Secondary | ICD-10-CM

## 2014-03-04 DIAGNOSIS — Z0000 Encounter for general adult medical examination without abnormal findings: Secondary | ICD-10-CM

## 2014-03-04 DIAGNOSIS — E785 Hyperlipidemia, unspecified: Secondary | ICD-10-CM | POA: Diagnosis not present

## 2014-03-04 LAB — LIPID PANEL
Cholesterol: 197 mg/dL (ref 0–200)
HDL: 42.1 mg/dL (ref >39.00)
LDL Cholesterol: 109 mg/dL — ABNORMAL HIGH (ref 0–99)
Total CHOL/HDL Ratio: 5
Triglycerides: 229 mg/dL — ABNORMAL HIGH (ref 0.0–149.0)
VLDL: 45.8 mg/dL — ABNORMAL HIGH (ref 0.0–40.0)

## 2014-03-04 LAB — COMPREHENSIVE METABOLIC PANEL
ALT: 27 U/L (ref 0–35)
AST: 22 U/L (ref 0–37)
Albumin: 3.7 g/dL (ref 3.5–5.2)
Alkaline Phosphatase: 36 U/L — ABNORMAL LOW (ref 39–117)
BILIRUBIN TOTAL: 0.4 mg/dL (ref 0.3–1.2)
BUN: 17 mg/dL (ref 6–23)
CALCIUM: 9.6 mg/dL (ref 8.4–10.5)
CHLORIDE: 103 meq/L (ref 96–112)
CO2: 28 meq/L (ref 19–32)
CREATININE: 1 mg/dL (ref 0.4–1.2)
GFR: 58.97 mL/min — ABNORMAL LOW (ref >60.00)
Glucose, Bld: 108 mg/dL — ABNORMAL HIGH (ref 70–99)
Potassium: 4.4 mEq/L (ref 3.5–5.1)
SODIUM: 141 meq/L (ref 135–145)
TOTAL PROTEIN: 6.4 g/dL (ref 6.0–8.3)

## 2014-03-04 LAB — CBC WITH DIFFERENTIAL/PLATELET
BASOS PCT: 0.3 % (ref 0.0–3.0)
Basophils Absolute: 0 10*3/uL (ref 0.0–0.1)
EOS PCT: 1.3 % (ref 0.0–5.0)
Eosinophils Absolute: 0.1 10*3/uL (ref 0.0–0.7)
HEMATOCRIT: 38.7 % (ref 36.0–46.0)
Hemoglobin: 12.8 g/dL (ref 12.0–15.0)
LYMPHS ABS: 2 10*3/uL (ref 0.7–4.0)
LYMPHS PCT: 29 % (ref 12.0–46.0)
MCHC: 33.2 g/dL (ref 30.0–36.0)
MCV: 82.7 fl (ref 78.0–100.0)
Monocytes Absolute: 0.6 10*3/uL (ref 0.1–1.0)
Monocytes Relative: 8.3 % (ref 3.0–12.0)
NEUTROS ABS: 4.3 10*3/uL (ref 1.4–7.7)
Neutrophils Relative %: 61.1 % (ref 43.0–77.0)
Platelets: 284 10*3/uL (ref 150.0–400.0)
RBC: 4.67 Mil/uL (ref 3.87–5.11)
RDW: 14.2 % (ref 11.5–14.6)
WBC: 7 10*3/uL (ref 4.5–10.5)

## 2014-03-04 LAB — TSH: TSH: 3.36 u[IU]/mL (ref 0.35–5.50)

## 2014-03-05 ENCOUNTER — Other Ambulatory Visit: Payer: Medicare Other

## 2014-03-05 LAB — VITAMIN D 25 HYDROXY (VIT D DEFICIENCY, FRACTURES): VIT D 25 HYDROXY: 39 ng/mL (ref 30–89)

## 2014-03-19 ENCOUNTER — Ambulatory Visit (INDEPENDENT_AMBULATORY_CARE_PROVIDER_SITE_OTHER): Payer: Medicare Other | Admitting: Family Medicine

## 2014-03-19 ENCOUNTER — Encounter: Payer: Self-pay | Admitting: Family Medicine

## 2014-03-19 VITALS — BP 122/76 | HR 80 | Temp 98.3°F | Ht 61.0 in | Wt 144.8 lb

## 2014-03-19 DIAGNOSIS — M899 Disorder of bone, unspecified: Secondary | ICD-10-CM

## 2014-03-19 DIAGNOSIS — E559 Vitamin D deficiency, unspecified: Secondary | ICD-10-CM | POA: Diagnosis not present

## 2014-03-19 DIAGNOSIS — Z0000 Encounter for general adult medical examination without abnormal findings: Secondary | ICD-10-CM | POA: Diagnosis not present

## 2014-03-19 DIAGNOSIS — M949 Disorder of cartilage, unspecified: Secondary | ICD-10-CM

## 2014-03-19 DIAGNOSIS — M8580 Other specified disorders of bone density and structure, unspecified site: Secondary | ICD-10-CM

## 2014-03-19 DIAGNOSIS — Z23 Encounter for immunization: Secondary | ICD-10-CM

## 2014-03-19 DIAGNOSIS — E785 Hyperlipidemia, unspecified: Secondary | ICD-10-CM

## 2014-03-19 MED ORDER — OMEPRAZOLE 20 MG PO CPDR: DELAYED_RELEASE_CAPSULE | ORAL | Status: DC

## 2014-03-19 MED ORDER — PRAVASTATIN SODIUM 80 MG PO TABS: ORAL_TABLET | ORAL | Status: DC

## 2014-03-19 NOTE — Patient Instructions (Addendum)
prevnar vaccine today  Don't forget to work on a living will/ advanced directive Stop up front for a referral for a bone density test You can stop the fosamax since you have been on it over 5 years  Take care of yourself For cholesterol ---Avoid red meat/ fried foods/ egg yolks/ fatty breakfast meats/ butter, cheese and high fat dairy/ and shellfish     Fat and Cholesterol Control Diet Fat and cholesterol levels in your blood and organs are influenced by your diet. High levels of fat and cholesterol may lead to diseases of the heart, small and large blood vessels, gallbladder, liver, and pancreas. CONTROLLING FAT AND CHOLESTEROL WITH DIET Although exercise and lifestyle factors are important, your diet is key. That is because certain foods are known to raise cholesterol and others to lower it. The goal is to balance foods for their effect on cholesterol and more importantly, to replace saturated and trans fat with other types of fat, such as monounsaturated fat, polyunsaturated fat, and omega-3 fatty acids. On average, a person should consume no more than 15 to 17 g of saturated fat daily. Saturated and trans fats are considered "bad" fats, and they will raise LDL cholesterol. Saturated fats are primarily found in animal products such as meats, butter, and cream. However, that does not mean you need to give up all your favorite foods. Today, there are good tasting, low-fat, low-cholesterol substitutes for most of the things you like to eat. Choose low-fat or nonfat alternatives. Choose round or loin cuts of red meat. These types of cuts are lowest in fat and cholesterol. Chicken (without the skin), fish, veal, and ground [place] breast are great choices. Eliminate fatty meats, such as hot dogs and salami. Even shellfish have little or no saturated fat. Have a 3 oz (85 g) portion when you eat lean meat, poultry, or fish. Trans fats are also called "partially hydrogenated oils." They are oils that have  been scientifically manipulated so that they are solid at room temperature resulting in a longer shelf life and improved taste and texture of foods in which they are added. Trans fats are found in stick margarine, some tub margarines, cookies, crackers, and baked goods.  When baking and cooking, oils are a great substitute for butter. The monounsaturated oils are especially beneficial since it is believed they lower LDL and raise HDL. The oils you should avoid entirely are saturated tropical oils, such as coconut and palm.  Remember to eat a lot from food groups that are naturally free of saturated and trans fat, including fish, fruit, vegetables, beans, grains (barley, rice, couscous, bulgur wheat), and pasta (without cream sauces).  IDENTIFYING FOODS THAT LOWER FAT AND CHOLESTEROL  Soluble fiber may lower your cholesterol. This type of fiber is found in fruits such as apples, vegetables such as broccoli, potatoes, and carrots, legumes such as beans, peas, and lentils, and grains such as barley. Foods fortified with plant sterols (phytosterol) may also lower cholesterol. You should eat at least 2 g per day of these foods for a cholesterol lowering effect.  Read package labels to identify low-saturated fats, trans fat free, and low-fat foods at the supermarket. Select cheeses that have only 2 to 3 g saturated fat per ounce. Use a heart-healthy tub margarine that is free of trans fats or partially hydrogenated oil. When buying baked goods (cookies, crackers), avoid partially hydrogenated oils. Breads and muffins should be made from whole grains (whole-wheat or whole oat flour, instead of "flour" or "  enriched flour"). Buy non-creamy canned soups with reduced salt and no added fats.  FOOD PREPARATION TECHNIQUES  Never deep-fry. If you must fry, either stir-fry, which uses very little fat, or use non-stick cooking sprays. When possible, broil, bake, or roast meats, and steam vegetables. Instead of putting butter  or margarine on vegetables, use lemon and herbs, applesauce, and cinnamon (for squash and sweet potatoes). Use nonfat yogurt, salsa, and low-fat dressings for salads.  LOW-SATURATED FAT / LOW-FAT FOOD SUBSTITUTES Meats / Saturated Fat (g)  Avoid: Steak, marbled (3 oz/85 g) / 11 g  Choose: Steak, lean (3 oz/85 g) / 4 g  Avoid: Hamburger (3 oz/85 g) / 7 g  Choose: Hamburger, lean (3 oz/85 g) / 5 g  Avoid: Ham (3 oz/85 g) / 6 g  Choose: Ham, lean cut (3 oz/85 g) / 2.4 g  Avoid: Chicken, with skin, dark meat (3 oz/85 g) / 4 g  Choose: Chicken, skin removed, dark meat (3 oz/85 g) / 2 g  Avoid: Chicken, with skin, light meat (3 oz/85 g) / 2.5 g  Choose: Chicken, skin removed, light meat (3 oz/85 g) / 1 g Dairy / Saturated Fat (g)  Avoid: Whole milk (1 cup) / 5 g  Choose: Low-fat milk, 2% (1 cup) / 3 g  Choose: Low-fat milk, 1% (1 cup) / 1.5 g  Choose: Skim milk (1 cup) / 0.3 g  Avoid: Hard cheese (1 oz/28 g) / 6 g  Choose: Skim milk cheese (1 oz/28 g) / 2 to 3 g  Avoid: Cottage cheese, 4% fat (1 cup) / 6.5 g  Choose: Low-fat cottage cheese, 1% fat (1 cup) / 1.5 g  Avoid: Ice cream (1 cup) / 9 g  Choose: Sherbet (1 cup) / 2.5 g  Choose: Nonfat frozen yogurt (1 cup) / 0.3 g  Choose: Frozen fruit bar / trace  Avoid: Whipped cream (1 tbs) / 3.5 g  Choose: Nondairy whipped topping (1 tbs) / 1 g Condiments / Saturated Fat (g)  Avoid: Mayonnaise (1 tbs) / 2 g  Choose: Low-fat mayonnaise (1 tbs) / 1 g  Avoid: Butter (1 tbs) / 7 g  Choose: Extra light margarine (1 tbs) / 1 g  Avoid: Coconut oil (1 tbs) / 11.8 g  Choose: Olive oil (1 tbs) / 1.8 g  Choose: Corn oil (1 tbs) / 1.7 g  Choose: Safflower oil (1 tbs) / 1.2 g  Choose: Sunflower oil (1 tbs) / 1.4 g  Choose: Soybean oil (1 tbs) / 2.4 g  Choose: Canola oil (1 tbs) / 1 g Document Released: 11/08/2005 Document Revised: 03/05/2013 Document Reviewed: 04/29/2011 ExitCare Patient Information 2014  [place], [place].

## 2014-03-19 NOTE — Progress Notes (Signed)
Subjective:    Patient ID: Brianna [name], female    DOB: [birth year], 68 y.o.   MRN: [id number]  HPI I have personally reviewed the Medicare Annual Wellness questionnaire and have noted 1. The patient's medical and social history 2. Their use of alcohol, tobacco or illicit drugs 3. Their current medications and supplements 4. The patient's functional ability including ADL's, fall risks, home safety risks and hearing or visual             impairment. 5. Diet and physical activities 6. Evidence for depression or mood disorders  The patients weight, height, BMI have been recorded in the chart and visual acuity is per eye clinic.  I have made referrals, counseling and provided education to the patient based review of the above and I have provided the pt with a written personalized care plan for preventive services.  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  Routine anticipatory guidance given to patient.  See health maintenance. Colon cancer screening 9/10-normal  Breast cancer screening had mastectomy so no mammograms  Self breast exam - double mastectomy (her breast cancer f/u is going well) Flu vaccine had 10/14 Tetanus vaccine 11/10  Pneumovax 11/11 -she would like to get the prevnar  Zoster vaccine 1/13   Advance directive - she does not have a living will or POA - knows she needs to work on it  Cognitive function addressed- see scanned forms- and if abnormal then additional documentation follows. -no worries currently   PMH and SH reviewed  Meds, vitals, and allergies reviewed.   ROS: See HPI.  Otherwise negative.     Hx of breast ca with mastectomy double-no mammograms F/u Tamoxifen- the guideline was changed from 5 to 10 years   Glucose 108 fasting- she eats sweets by spells - occ gets junk food / but not all the time  Tries to get a hold of it     Hyperlipidemia Pravastatin and diet  Lab Results  Component Value Date   CHOL 197 03/04/2014   CHOL 184 10/30/2012   CHOL 158 04/28/2012   Lab Results  Component Value Date   HDL 42.10 03/04/2014   HDL 43.60 10/30/2012   HDL 41.10 04/28/2012   Lab Results  Component Value Date   LDLCALC 109* 03/04/2014   LDLCALC 88 04/28/2012   LDLCALC 99 10/13/2009   Lab Results  Component Value Date   TRIG 229.0* 03/04/2014   TRIG 205.0* 10/30/2012   TRIG 145.0 04/28/2012   Lab Results  Component Value Date   CHOLHDL 5 03/04/2014   CHOLHDL 4 10/30/2012   CHOLHDL 4 04/28/2012   Lab Results  Component Value Date   LDLDIRECT 119.9 10/30/2012   LDLDIRECT 136.2 11/02/2011   LDLDIRECT 185.7 08/03/2011   went up a little - was not eating as well  Some chips/ fried foods   Osteopenia  Dexa 1/13- needs to follow up  No broken bones  D level is up to 39 On tamoxifen On fosamax -no problems with it  Takes ca and D  Patient Active Problem List   Diagnosis Date Noted  . Encounter for Medicare annual wellness exam 02/28/2014  . Pre-employment examination 08/29/2013  . BREAST CANCER 10/21/2010  . VITAMIN D DEFICIENCY 10/11/2008  . HYPERLIPIDEMIA 07/07/2007  . GERD 07/07/2007  . Osteopenia  07/07/2007  . FATTY LIVER DISEASE, HX OF 07/07/2007   Past Medical History  Diagnosis Date  . GERD (gastroesophageal reflux disease)   . Hyperlipidemia   . Osteoporosis     fosamax started 9/08  . Vitamin D deficiency   . Shingles   . Allergic rhinitis   . Breast carcinoma 10/11    right- eventual double mastectomy in 2012   Past Surgical History  Procedure Laterality Date  . Partial hysterectomy  1980's    non cancer, ovaries intact  . Wrist fracture surgery    . Cataract extraction    . Breast biopsy  10/11    invasive and in situ mam carcinoma  . Breast lumpectomy  11/11  . Mastectomy  1/12    double- Dr. Donne Hazel   History  Substance Use Topics  . Smoking status: Never Smoker   .  Smokeless tobacco: Never Used  . Alcohol Use: Yes     Comment: wine rarely   Family History  Problem Relation Age of Onset  . Dementia Mother   . Cancer Father     esophageal  . Diabetes Brother   . Cancer Paternal Aunt     ovarian   Allergies  Allergen Reactions  . Pravachol     Myalgias  (As of visit on 05/17/2013, patient is taking Pravachol, but continues to have muscle pain)  . Zetia [Ezetimibe]     Ineffective    Current Outpatient Prescriptions on File Prior to Visit  Medication Sig Dispense Refill  . alendronate (FOSAMAX) 70 MG tablet TAKE ONE BY MOUTH ONCE WEEKLY  12 tablet  2  . Calcium Carbonate-Vitamin D (CALCIUM-VITAMIN D) 600-200 MG-UNIT CAPS Take 2 by mouth daily       . Cholecalciferol (VITAMIN D-3) 1000 UNITS CAPS Take 1 capsule by mouth daily.      . Coenzyme Q10 (CO Q-10) 200 MG CAPS Take by mouth 2 (two) times daily.        . fexofenadine (ALLEGRA) 180 MG tablet Take otc as directed      . Multiple Vitamin (MULTIVITAMIN) tablet Take 1 tablet by mouth daily.        Marland Kitchen omeprazole (PRILOSEC) 20 MG capsule TAKE 1 CAPSULE (20 MG TOTAL) BY MOUTH DAILY.  90 capsule  1  . pravastatin (PRAVACHOL) 80 MG tablet TAKE 1 TABLET (80 MG TOTAL) BY MOUTH DAILY.  90 tablet  0  . Red Yeast Rice Extract (RED YEAST RICE PO) Take by mouth 2 (two) times daily.        . tamoxifen (NOLVADEX) 20 MG tablet TAKE 1 TABLET BY MOUTH ONCE A DAY  90 tablet  3   No current facility-administered medications on file prior to visit.      Review of Systems    Review of Systems  Constitutional: Negative for fever, appetite change, fatigue and unexpected weight change.  Eyes: Negative for pain and visual disturbance.  Respiratory: Negative for cough and shortness of breath.   Cardiovascular: Negative for cp or palpitations    Gastrointestinal: Negative for nausea, diarrhea and constipation.  Genitourinary: Negative for urgency and frequency.  Skin: Negative for pallor or rash   Neurological:  Negative for weakness, light-headedness, numbness and headaches.  Hematological: Negative for adenopathy. Does not bruise/bleed easily.  Psychiatric/Behavioral: Negative for dysphoric mood. The patient is not nervous/anxious.      Objective:   Physical Exam  Constitutional: She appears well-developed and well-nourished. No distress.  HENT:  Head: Normocephalic and atraumatic.  Right Ear: External ear normal.  Left Ear: External ear normal.  Nose: Nose normal.  Mouth/Throat: Oropharynx is clear and moist.  Eyes: Conjunctivae and EOM are normal. Pupils are equal, round, and reactive to light. Right eye exhibits no discharge. Left eye exhibits no discharge. No scleral icterus.  Neck: Normal range of motion. Neck supple. No JVD present. No thyromegaly present.  Cardiovascular: Normal rate, regular rhythm, normal heart sounds and intact distal pulses.  Exam reveals no gallop.   Pulmonary/Chest: Effort normal and breath sounds normal. No respiratory distress. She has no wheezes. She has no rales.  Abdominal: Soft. Bowel sounds are normal. She exhibits no distension and no mass. There is no tenderness.  Genitourinary:  Double mastectomy noted   Musculoskeletal: She exhibits no edema and no tenderness.  No kyphosis   Lymphadenopathy:    She has no cervical adenopathy.  Neurological: She is alert. She has normal reflexes. No cranial nerve deficit. She exhibits normal muscle tone. Coordination normal.  Skin: Skin is warm and dry. No rash noted. No erythema. No pallor.  Psychiatric: She has a normal mood and affect.          Assessment & Plan:

## 2014-03-19 NOTE — Progress Notes (Signed)
Pre visit review using our clinic review tool, if applicable. No additional management support is needed unless otherwise documented below in the visit note. 

## 2014-03-20 NOTE — Assessment & Plan Note (Signed)
Level is improved Disc D intake

## 2014-03-20 NOTE — Assessment & Plan Note (Signed)
Disc goals for lipids and reasons to control them Rev labs with pt Rev low sat fat diet in detail This is up today  Given diet guidelines Continue statin

## 2014-03-20 NOTE — Assessment & Plan Note (Signed)
Due for dexa  On fosamax over 5 years-can stop it now  Still on tamoxifen  Disc need for calcium/ vitamin D/ wt bearing exercise and bone density test every 2 y to monitor Disc safety/ fracture risk in detail   Rev need for exercise

## 2014-03-20 NOTE — Assessment & Plan Note (Signed)
Reviewed health habits including diet and exercise and skin cancer prevention Reviewed appropriate screening tests for age  Also reviewed health mt list, fam hx and immunization status , as well as social and family history   See HPI Lab rev Pt will work on living will

## 2014-05-06 ENCOUNTER — Other Ambulatory Visit: Payer: Medicare Other

## 2014-05-13 ENCOUNTER — Other Ambulatory Visit: Payer: Medicare Other

## 2014-05-13 ENCOUNTER — Encounter: Payer: Medicare Other | Admitting: Oncology

## 2014-05-14 ENCOUNTER — Inpatient Hospital Stay: Admission: RE | Admit: 2014-05-14 | Payer: Medicare Other | Source: Ambulatory Visit

## 2014-05-20 ENCOUNTER — Ambulatory Visit: Payer: Medicare Other | Admitting: Oncology

## 2014-06-27 ENCOUNTER — Other Ambulatory Visit (INDEPENDENT_AMBULATORY_CARE_PROVIDER_SITE_OTHER): Payer: Self-pay

## 2014-06-27 MED ORDER — UNABLE TO FIND: Status: DC

## 2014-07-16 ENCOUNTER — Other Ambulatory Visit: Payer: Medicare Other

## 2014-07-19 ENCOUNTER — Other Ambulatory Visit: Payer: Self-pay | Admitting: *Deleted

## 2014-07-19 DIAGNOSIS — C50919 Malignant neoplasm of unspecified site of unspecified female breast: Secondary | ICD-10-CM

## 2014-07-19 MED ORDER — TAMOXIFEN CITRATE 20 MG PO TABS: ORAL_TABLET | ORAL | Status: DC

## 2014-07-23 ENCOUNTER — Telehealth: Payer: Self-pay | Admitting: Oncology

## 2014-07-23 ENCOUNTER — Ambulatory Visit: Payer: Medicare Other

## 2014-07-23 ENCOUNTER — Ambulatory Visit (HOSPITAL_BASED_OUTPATIENT_CLINIC_OR_DEPARTMENT_OTHER): Payer: Medicare Other | Admitting: Oncology

## 2014-07-23 VITALS — BP 133/53 | HR 81 | Temp 98.9°F | Resp 18 | Ht 61.0 in | Wt 145.1 lb

## 2014-07-23 DIAGNOSIS — C50919 Malignant neoplasm of unspecified site of unspecified female breast: Secondary | ICD-10-CM | POA: Diagnosis not present

## 2014-07-23 MED ORDER — TAMOXIFEN CITRATE 20 MG PO TABS: ORAL_TABLET | ORAL | Status: DC

## 2014-07-23 NOTE — Addendum Note (Signed)
Addended by: Laureen Abrahams on: 07/23/2014 07:17 PM   Modules accepted: Orders

## 2014-07-23 NOTE — Telephone Encounter (Signed)
per pof to sch pt appt-sch pt derm appt w/Dr Emilia Beck pt time date location of appt-gave pt copy of sch

## 2014-07-23 NOTE — Progress Notes (Signed)
ID: LINDYN VOSSLER   DOB: January 17, 1946  MR#: [id number]  QVZ#:[id number]   PCP: Loura Pardon, MD GYN: SU:  Rolm Bookbinder, MD Other:  Elsie Stain, MD   HISTORY OF PRESENT ILLNESS: Panagiota had routine screening mammography September 30th, 2011 suggesting an abnormality on the right side.  Additional views showed an area of architectural distortion in the upper outer quadrant with associated microcalcifications.  Ultrasound showed an 8 mm mass in this area and she underwent ultrasound-guided biopsy of the right breast mass October 18th. This showed invasive lobular breast cancer associated with a radial scar.  The tumor was Her-2 positive at 97%, PR positive at 12%, with a borderline proliferation fraction at 17% and no amplification of Her-2 by CISH with a ratio of 1.16.    The patient was then referred to Dr. Donne Hazel and bilateral breast MRIs were obtained October 25th.  This showed the spiculated enhancing mass in the upper outer quadrant of the right breast which had been biopsied.  Posterior and lateral to that mass, however, there was an area of linear nodular enhancement.  Then in the left breast, in the lower outer quadrant, there was another enhancing mass measuring 1.1 cm.    Accordingly on November 2nd, the patient had an MRI-guided biopsy of the mass in the left breast. This proved to be a complex sclerosing lesion without atypia  ([id number]).  With this information, the patient proceeded to removal of the left-sided sclerosing lesion as well as right lumpectomy and sentinel lymph node sampling on October 07, 2010.  The results of this procedure ([id number]) showed on the left side a radial scar with focal atypia but no evidence of carcinoma and negative margins.  On the right side, there was a 2.3 cm invasive lobular carcinoma (E-cadherin negative), Grade 2, extending to the inferior margin.  There was no evidence of angiolymphatic invasion.  The two sentinel lymph nodes on the  right were negative.  Her-2 was repeated on this invasive sample and again, no amplification was noted.  Her subsequent history is as detailed below.  INTERVAL HISTORY: Muskan returns today for followup of her breast cancer. Interval history is unremarkable. She is working part-time for the [employer] system in [employer]. She enjoys had and it is not stressful. She continues on tamoxifen, which is very expensive for her. The only side effect she reports his hair thinning. Her hot flashes are perhaps once a day and not significant. Sometimes the do wake her up early in the morning, though.  REVIEW OF SYSTEMS: Keryl complains of occasional cramps I usually in the night, a runny nose, minimal ankle swelling, bilateral, urinary stress incontinence, easy bruising, back pain which is not more intense or persistent than before, and some skin lesions she wanted me to look at today. A detailed review of systems was otherwise noncontributory  PAST MEDICAL HISTORY: Past Medical History  Diagnosis Date  . GERD (gastroesophageal reflux disease)   . Hyperlipidemia   . Osteoporosis     fosamax started 9/08  . Vitamin D deficiency   . Shingles   . Allergic rhinitis   . Breast carcinoma 10/11    right- eventual double mastectomy in 2012  hypercholesterolemia, history of remote migraines, history of GERD, history of bilateral cataract surgery and history of simple hysterectomy.   PAST SURGICAL HISTORY: Past Surgical History  Procedure Laterality Date  . Partial hysterectomy  1980's    non cancer, ovaries intact  . Wrist fracture surgery    .  Cataract extraction    . Breast biopsy  10/11    invasive and in situ mam carcinoma  . Breast lumpectomy  11/11  . Mastectomy  1/12    double- Dr. Donne Hazel    FAMILY HISTORY Family History  Problem Relation Age of Onset  . Dementia Mother   . Cancer Father     esophageal  . Diabetes Brother   . Cancer Paternal Aunt     ovarian  The  patient's father died at the age of 83 from lung cancer in the setting of COPD.  The patient's mother died with Alzheimer's disease at 34.  The patient had four brothers, no sisters.  There is no breast cancer in the family but the patient's father had 22 sisters, one of whom had ovarian cancer in her seventies.    GYNECOLOGIC HISTORY: She is GX P1, first pregnancy to term at 68.  She underwent hysterectomy in 1989. She took hormones starting at age 68 and only took them for 2 years.  SOCIAL HISTORY: She used to work for [employer] 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  She is divorced and lives by herself.  Her daughter [name] lives not far from her, also in [place].  [name] works in [place] entry for the [employer]. The patient has one granddaughter. The patient is a [place].     ADVANCED DIRECTIVES: not in place  HEALTH MAINTENANCE: History  Substance Use Topics  . Smoking status: Never Smoker   . Smokeless tobacco: Never Used  . Alcohol Use: Yes     Comment: wine rarely     Colonoscopy:  Sept 2010, Dr. Elsie Stain  PAP:  Bone density: Jan 2013 [facility], osteopenia  Lipid panel:  [facility], Dr. [name]   Allergies  Allergen Reactions  . Pravachol     Myalgias  (As of visit on 05/17/2013, patient is taking Pravachol, but continues to have muscle pain)  . Zetia [Ezetimibe]     Ineffective     Current Outpatient Prescriptions  Medication Sig Dispense Refill  . Calcium Carbonate-Vitamin D (CALCIUM-VITAMIN D) 600-200 MG-UNIT CAPS Take 2 by mouth daily       . Cholecalciferol (VITAMIN D-3) 1000 UNITS CAPS Take 1 capsule by mouth daily.      . Coenzyme Q10 (CO Q-10) 200 MG CAPS Take by mouth 2 (two) times daily.        . fexofenadine (ALLEGRA) 180 MG tablet Take otc as directed      . Multiple Vitamin (MULTIVITAMIN) tablet Take 1 tablet by mouth daily.        Marland Kitchen omeprazole (PRILOSEC) 20 MG capsule  TAKE 1 CAPSULE (20 MG TOTAL) BY MOUTH DAILY.  90 capsule  3  . pravastatin (PRAVACHOL) 80 MG tablet TAKE 1 TABLET (80 MG TOTAL) BY MOUTH DAILY.  90 tablet  3  . Red Yeast Rice Extract (RED YEAST RICE PO) Take by mouth 2 (two) times daily.        . tamoxifen (NOLVADEX) 20 MG tablet TAKE 1 TABLET BY MOUTH ONCE A DAY  90 tablet  0  . UNABLE TO FIND 174.9 Malignant Neoplasm of Female Breast  Right Mastectomy   L8020-Mastectomy Forms-2  L8000-Mastectomy Bra-6  1 each  0   No current facility-administered medications for this visit.    OBJECTIVE: Middle-aged white woman who appears stated age 68 Vitals:   07/23/14 1059  BP: 133/53  Pulse: 81  Temp: 98.9 F (37.2 C)  Resp: 18     Body mass index is 27.43 kg/(m^2).    ECOG FS: 1 Filed Weights   07/23/14 1059  Weight: 145 lb 1.6 oz (65.817 kg)   Sclerae unicteric, pupils round and equal Oropharynx clear and moist No cervical or supraclavicular adenopathy Lungs clear to auscultation bilaterally Heart regular rate and rhythm Abdomen soft, nontender to palpation, positive bowel sounds MSK no focal spinal tenderness Neuro: nonfocal, well oriented, positive affect Breasts: Status post bilateral mastectomies. No evidence of local recurrence.  Both axillae are benign  LAB RESULTS: Lab Results  Component Value Date   WBC 7.0 03/04/2014   NEUTROABS 4.3 03/04/2014   HGB 12.8 03/04/2014   HCT 38.7 03/04/2014   MCV 82.7 03/04/2014   PLT 284.0 03/04/2014      Chemistry      Component Value Date/Time   NA 141 03/04/2014 0741   NA 142 05/17/2013 1147   K 4.4 03/04/2014 0741   K 3.9 05/17/2013 1147   CL 103 03/04/2014 0741   CO2 28 03/04/2014 0741   CO2 26 05/17/2013 1147   BUN 17 03/04/2014 0741   BUN 15.4 05/17/2013 1147   CREATININE 1.0 03/04/2014 0741   CREATININE 1.0 05/17/2013 1147      Component Value Date/Time   CALCIUM 9.6 03/04/2014 0741   CALCIUM 9.5 05/17/2013 1147   ALKPHOS 36* 03/04/2014 0741   ALKPHOS 48 05/17/2013 1147   AST  22 03/04/2014 0741   AST 28 05/17/2013 1147   ALT 27 03/04/2014 0741   ALT 44 05/17/2013 1147   BILITOT 0.4 03/04/2014 0741   BILITOT 0.34 05/17/2013 1147       STUDIES: No results found.  ASSESSMENT: 68 y.o. [place] woman   (1)  status post right lumpectomy and sentinel lymph node dissection November 2011 with a positive margin.  These were not cleared with further attempts at margin clearance in December.  (2)  The patient subsequently underwent definitive bilateral mastectomies on 12/02/2010 with no evidence of residual disease.  Tumor was a T2 N0, stage IIA invasive lobular carcinoma, grade 2, ER positive 97%, PR positive 12%, HER-2/neu negative with MIB-1 of 17%.    (3)  She did not receive post mastectomy radiation  (4)  She began on tamoxifen at 20 mg daily in early February 2012.      PLAN:  Gabriele is tolerating the tamoxifen without any major problems. I am switching her appointments to February 2 more easily correlate with the time she started tamoxifen. She will be 4 years into her treatment when she sees me next February.  The skin lesions she is concerned with are benign but bothersome to her. She would like to have them removed. I am placing a referral to dermatology to get that accomplished.  She was supposed to have had a repeat bone density but somehow that did not happen. She also did not want a flu shot today.  She knows to call for any problems that may develop before her next visit here.   MAGRINAT,GUSTAV C    07/23/2014

## 2014-09-02 ENCOUNTER — Other Ambulatory Visit: Payer: Self-pay | Admitting: Dermatology

## 2014-09-02 DIAGNOSIS — D239 Other benign neoplasm of skin, unspecified: Secondary | ICD-10-CM | POA: Diagnosis not present

## 2014-09-02 DIAGNOSIS — D692 Other nonthrombocytopenic purpura: Secondary | ICD-10-CM | POA: Diagnosis not present

## 2014-09-02 DIAGNOSIS — B079 Viral wart, unspecified: Secondary | ICD-10-CM | POA: Diagnosis not present

## 2014-09-02 DIAGNOSIS — D485 Neoplasm of uncertain behavior of skin: Secondary | ICD-10-CM | POA: Diagnosis not present

## 2014-09-15 ENCOUNTER — Encounter (HOSPITAL_COMMUNITY): Payer: Self-pay | Admitting: Emergency Medicine

## 2014-09-15 ENCOUNTER — Emergency Department (INDEPENDENT_AMBULATORY_CARE_PROVIDER_SITE_OTHER)
Admission: EM | Admit: 2014-09-15 | Discharge: 2014-09-15 | Disposition: A | Discharge status: Home / Self Care | Payer: Medicare Other | Source: Home / Self Care | Attending: Emergency Medicine | Admitting: Emergency Medicine

## 2014-09-15 DIAGNOSIS — N3001 Acute cystitis with hematuria: Secondary | ICD-10-CM | POA: Diagnosis not present

## 2014-09-15 LAB — POCT URINALYSIS DIP (DEVICE)
BILIRUBIN URINE: NEGATIVE
GLUCOSE, UA: NEGATIVE mg/dL
Ketones, ur: NEGATIVE mg/dL
NITRITE: POSITIVE — AB
Protein, ur: NEGATIVE mg/dL
Specific Gravity, Urine: 1.015 (ref 1.005–1.030)
Urobilinogen, UA: 0.2 mg/dL (ref 0.0–1.0)
pH: 5.5 (ref 5.0–8.0)

## 2014-09-15 MED ORDER — CEPHALEXIN 500 MG PO CAPS: 500.0000 mg | ORAL_CAPSULE | Freq: Three times a day (TID) | ORAL | Status: DC

## 2014-09-15 MED ORDER — PHENAZOPYRIDINE HCL 200 MG PO TABS: 200.0000 mg | ORAL_TABLET | Freq: Three times a day (TID) | ORAL | Status: DC | PRN

## 2014-09-15 NOTE — ED Provider Notes (Signed)
Chief Complaint   Urinary Tract Infection   History of Present Illness   Brianna [name] is a 68 year old female who was admitted to day history of dysuria, urinary frequency, and urgency. She denies any hematuria. She's had pain in her lower back and nausea. She denies any abdominal pain, fever, chills, vomiting, or GYN complaints. She had a urinary tract infection about a year ago. No history of pyelonephritis.  Review of Systems   Other than as noted above, the patient denies any of the following symptoms: General:  No fevers or chills. GI:  No abdominal pain, back pain, nausea, or vomiting. GU:  No hematuria or incontinence. GYN:  No discharge, itching, vulvar pain or lesions, pelvic pain, or abnormal vaginal bleeding.  [place]   Past medical history, family history, social history, meds, and allergies were reviewed.  She has no medication allergies. She takes pravastatin, tamoxifen, and Prilosec. She has a history of breast cancer, gastroesophageal reflux, and hyperlipidemia.  Physical Examination     Vital signs:  BP 142/95  Pulse 96  Temp(Src) 98.6 F (37 C) (Oral)  Resp 16  SpO2 100% Gen:  Alert, oriented, in no distress. Lungs:  Clear to auscultation, no wheezes, rales or rhonchi. Heart:  Regular rhythm, no gallop or murmer. Abdomen:  Flat and soft. There was slight suprapubic pain to palpation.  No guarding, or rebound.  No hepato-splenomegaly or mass.  Bowel sounds were normally active.  No hernia. Back:  No CVA tenderness.  Skin:  Clear, warm and dry.  Labs   Results for orders placed during the hospital encounter of 09/15/14  POCT URINALYSIS DIP (DEVICE)      Result Value Ref Range   Glucose, UA NEGATIVE  NEGATIVE mg/dL   Bilirubin Urine NEGATIVE  NEGATIVE   Ketones, ur NEGATIVE  NEGATIVE mg/dL   Specific Gravity, Urine 1.015  1.005 - 1.030   Hgb urine dipstick MODERATE (*) NEGATIVE   pH 5.5  5.0 - 8.0   Protein, ur NEGATIVE  NEGATIVE mg/dL   Urobilinogen,  UA 0.2  0.0 - 1.0 mg/dL   Nitrite POSITIVE (*) NEGATIVE   Leukocytes, UA MODERATE (*) NEGATIVE     A urine culture was obtained.  Results are pending at this time and we will call about any positive results.  Assessment   The encounter diagnosis was Acute cystitis with hematuria.   No evidence of pyelonephritis.    Plan   1.  Meds:  The following meds were prescribed:   Discharge Medication List as of 09/15/2014  4:04 PM    START taking these medications   Details  cephALEXin (KEFLEX) 500 MG capsule Take 1 capsule (500 mg total) by mouth 3 (three) times daily., Starting 09/15/2014, Until Discontinued, Normal    phenazopyridine (PYRIDIUM) 200 MG tablet Take 1 tablet (200 mg total) by mouth 3 (three) times daily as needed for pain., Starting 09/15/2014, Until Discontinued, Normal        2.  Patient Education/Counseling:  The patient was given appropriate handouts, self care instructions, and instructed in symptomatic relief. The patient was told to avoid intercourse for 10 days, get extra fluids, and return for a follow up with her primary care doctor at the completion of treatment for a repeat UA and culture.    3.  Follow up:  The patient was told to follow up here if no better in 3 to 4 days, or sooner if becoming worse in any way, and given some red flag  symptoms such as fever, persistent vomiting, or severe flank or abdominal pain which would prompt immediate return.     Harden Mo, MD 09/15/14 [phone]

## 2014-09-15 NOTE — Discharge Instructions (Signed)

## 2014-09-15 NOTE — ED Notes (Addendum)
C/o UTI symptoms onset Sat.  Started taking AZO.  C/o urinary frequency in small amounts, burning with urination and urgency.  No hematuria or flank pain. State she always has low back pain.  Pain only when she urinates.

## 2014-09-19 LAB — URINE CULTURE: Colony Count: 30000

## 2014-09-19 NOTE — Progress Notes (Signed)
Quick Note:  Results are abnormal as noted, but have been adequately treated. No further action necessary. ______ 

## 2014-09-20 DIAGNOSIS — Z01419 Encounter for gynecological examination (general) (routine) without abnormal findings: Secondary | ICD-10-CM | POA: Diagnosis not present

## 2014-09-20 NOTE — ED Notes (Signed)
Urine culture: 30,000 colonies E. Coli.  Pt. adequately treated with Keflex. Roselyn Meier 09/20/2014

## 2014-10-14 ENCOUNTER — Other Ambulatory Visit: Payer: Self-pay | Admitting: Oncology

## 2014-11-04 DIAGNOSIS — N390 Urinary tract infection, site not specified: Secondary | ICD-10-CM | POA: Diagnosis not present

## 2014-11-08 DIAGNOSIS — H3531 Nonexudative age-related macular degeneration: Secondary | ICD-10-CM | POA: Diagnosis not present

## 2014-12-09 DIAGNOSIS — Z86010 Personal history of colonic polyps: Secondary | ICD-10-CM | POA: Diagnosis not present

## 2014-12-09 DIAGNOSIS — Z853 Personal history of malignant neoplasm of breast: Secondary | ICD-10-CM | POA: Diagnosis not present

## 2014-12-09 DIAGNOSIS — Z1211 Encounter for screening for malignant neoplasm of colon: Secondary | ICD-10-CM | POA: Diagnosis not present

## 2014-12-26 ENCOUNTER — Telehealth: Payer: Self-pay | Admitting: Oncology

## 2014-12-26 ENCOUNTER — Ambulatory Visit (HOSPITAL_BASED_OUTPATIENT_CLINIC_OR_DEPARTMENT_OTHER): Payer: Medicare Other | Admitting: Oncology

## 2014-12-26 ENCOUNTER — Other Ambulatory Visit (HOSPITAL_BASED_OUTPATIENT_CLINIC_OR_DEPARTMENT_OTHER): Payer: Medicare Other

## 2014-12-26 ENCOUNTER — Ambulatory Visit (HOSPITAL_COMMUNITY)
Admission: RE | Admit: 2014-12-26 | Discharge: 2014-12-26 | Disposition: A | Discharge status: Home / Self Care | Payer: Medicare Other | Source: Ambulatory Visit | Attending: Oncology | Admitting: Oncology

## 2014-12-26 VITALS — BP 130/50 | HR 85 | Temp 98.7°F | Resp 18 | Ht 61.0 in | Wt 143.9 lb

## 2014-12-26 DIAGNOSIS — C50919 Malignant neoplasm of unspecified site of unspecified female breast: Secondary | ICD-10-CM

## 2014-12-26 DIAGNOSIS — Z853 Personal history of malignant neoplasm of breast: Secondary | ICD-10-CM

## 2014-12-26 DIAGNOSIS — C50911 Malignant neoplasm of unspecified site of right female breast: Secondary | ICD-10-CM | POA: Insufficient documentation

## 2014-12-26 DIAGNOSIS — R32 Unspecified urinary incontinence: Secondary | ICD-10-CM | POA: Diagnosis not present

## 2014-12-26 DIAGNOSIS — M25551 Pain in right hip: Secondary | ICD-10-CM | POA: Diagnosis not present

## 2014-12-26 LAB — CBC WITH DIFFERENTIAL/PLATELET
BASO%: 0.2 % (ref 0.0–2.0)
BASOS ABS: 0 10*3/uL (ref 0.0–0.1)
EOS%: 1.2 % (ref 0.0–7.0)
Eosinophils Absolute: 0.1 10*3/uL (ref 0.0–0.5)
HCT: 37.6 % (ref 34.8–46.6)
HEMOGLOBIN: 12.4 g/dL (ref 11.6–15.9)
LYMPH#: 2.3 10*3/uL (ref 0.9–3.3)
LYMPH%: 28.1 % (ref 14.0–49.7)
MCH: 28.4 pg (ref 25.1–34.0)
MCHC: 33 g/dL (ref 31.5–36.0)
MCV: 86 fL (ref 79.5–101.0)
MONO#: 0.7 10*3/uL (ref 0.1–0.9)
MONO%: 8.8 % (ref 0.0–14.0)
NEUT#: 5.1 10*3/uL (ref 1.5–6.5)
NEUT%: 61.7 % (ref 38.4–76.8)
PLATELETS: 264 10*3/uL (ref 145–400)
RBC: 4.37 10*6/uL (ref 3.70–5.45)
RDW: 13 % (ref 11.2–14.5)
WBC: 8.2 10*3/uL (ref 3.9–10.3)

## 2014-12-26 LAB — COMPREHENSIVE METABOLIC PANEL (CC13)
ALBUMIN: 3.7 g/dL (ref 3.5–5.0)
ALK PHOS: 51 U/L (ref 40–150)
ALT: 24 U/L (ref 0–55)
AST: 18 U/L (ref 5–34)
Anion Gap: 9 mEq/L (ref 3–11)
BUN: 13.9 mg/dL (ref 7.0–26.0)
CALCIUM: 8.9 mg/dL (ref 8.4–10.4)
CO2: 26 meq/L (ref 22–29)
CREATININE: 0.9 mg/dL (ref 0.6–1.1)
Chloride: 108 mEq/L (ref 98–109)
EGFR: 62 mL/min/{1.73_m2} — ABNORMAL LOW (ref >90)
GLUCOSE: 81 mg/dL (ref 70–140)
Potassium: 4 mEq/L (ref 3.5–5.1)
Sodium: 144 mEq/L (ref 136–145)
Total Bilirubin: 0.35 mg/dL (ref 0.20–1.20)
Total Protein: 6.2 g/dL — ABNORMAL LOW (ref 6.4–8.3)

## 2014-12-26 MED ORDER — TAMOXIFEN CITRATE 20 MG PO TABS: 20.0000 mg | ORAL_TABLET | Freq: Every day | ORAL | Status: DC

## 2014-12-26 NOTE — Telephone Encounter (Signed)
per pof to sch pt appt-gave pt copy of sch °

## 2014-12-26 NOTE — Addendum Note (Signed)
Addended by: Jaci Carrel A on: 12/26/2014 03:46 PM   Modules accepted: Orders, Medications

## 2014-12-26 NOTE — Progress Notes (Signed)
ID: WHISPER KURKA   DOB: September 23, 1946  MR#: [id number]  QXI#:[id number]   PCP: Loura Pardon, MD GYN: SU:  Rolm Bookbinder, MD Other:  Earlie Raveling MD  CHIEF COMPLAINT: Estrogen receptor positive breast cancer  CURRENT TREATMENT: Tamoxifen   HISTORY OF PRESENT ILLNESS: From the original intake note:  [name] had routine screening mammography September 30th, 2011 suggesting an abnormality on the right side.  Additional views showed an area of architectural distortion in the upper outer quadrant with associated microcalcifications.  Ultrasound showed an 8 mm mass in this area and she underwent ultrasound-guided biopsy of the right breast mass October 18th. This showed invasive lobular breast cancer associated with a radial scar.  The tumor was Her-2 positive at 97%, PR positive at 12%, with a borderline proliferation fraction at 17% and no amplification of Her-2 by CISH with a ratio of 1.16.    The patient was then referred to Dr. Donne Hazel and bilateral breast MRIs were obtained October 25th.  This showed the spiculated enhancing mass in the upper outer quadrant of the right breast which had been biopsied.  Posterior and lateral to that mass, however, there was an area of linear nodular enhancement.  Then in the left breast, in the lower outer quadrant, there was another enhancing mass measuring 1.1 cm.    Accordingly on November 2nd, the patient had an MRI-guided biopsy of the mass in the left breast. This proved to be a complex sclerosing lesion without atypia  ([id number]).  With this information, the patient proceeded to removal of the left-sided sclerosing lesion as well as right lumpectomy and sentinel lymph node sampling on October 07, 2010.  The results of this procedure ([id number]) showed on the left side a radial scar with focal atypia but no evidence of carcinoma and negative margins.  On the right side, there was a 2.3 cm invasive lobular carcinoma (E-cadherin negative), Grade 2,  extending to the inferior margin.  There was no evidence of angiolymphatic invasion.  The two sentinel lymph nodes on the right were negative.  Her-2 was repeated on this invasive sample and again, no amplification was noted.  Her subsequent history is as detailed below.  INTERVAL HISTORY: Brianna [name] returns today for followup of her breast cancer. The interval history is generally unremarkable. She is tolerating tamoxifen well. Her hot flashes are occasional. Rarely do they wake her up at night. She does have a mild vaginal discharge as her only other side effects from that medication  REVIEW OF SYSTEMS: Brianna [name] is having a little bit more back pain than before and more specifically right hip pain. It sometimes wake her up at night. It is achy, not throbbing, not crampy. It is not constant aside from that she has mild sinus problems, some stress urinary incontinence, and occasionally easy bruising. A detailed review of systems today was otherwise noncontributory  PAST MEDICAL HISTORY: Past Medical History  Diagnosis Date  . GERD (gastroesophageal reflux disease)   . Hyperlipidemia   . Osteoporosis     fosamax started 9/08  . Vitamin D deficiency   . Shingles   . Allergic rhinitis   . Breast carcinoma 10/11    right- eventual double mastectomy in 2012  hypercholesterolemia, history of remote migraines, history of GERD, history of bilateral cataract surgery and history of simple hysterectomy.   PAST SURGICAL HISTORY: Past Surgical History  Procedure Laterality Date  . Partial hysterectomy  1980's    non cancer, ovaries intact  . Breast biopsy  10/11  invasive and in situ mam carcinoma  . Breast lumpectomy  11/11  . Mastectomy  1/12    double- Dr. Donne Hazel  . Mastectomy, radical Bilateral 11/2010  . Wrist fracture surgery Left     in the 1990's  . Cataract extraction      does not remember when    FAMILY HISTORY Family History  Problem Relation Age of Onset  . Dementia Mother   .  Cancer Father     esophageal  . Diabetes Brother   . Cancer Paternal Aunt     ovarian  The patient's father died at the age of 26 from lung cancer in the setting of COPD.  The patient's mother died with Alzheimer's disease at 74.  The patient had four brothers, no sisters.  There is no breast cancer in the family but the patient's father had 10 sisters, one of whom had ovarian cancer in her seventies.    GYNECOLOGIC HISTORY: She is GX P1, first pregnancy to term at 57.  She underwent hysterectomy in 1989. She took hormones starting at age 17 and only took them for 2 years.  SOCIAL HISTORY: She used to work for [employer] 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  She is divorced and lives by herself.  Her daughter Brianna [name] lives not far from her, also in [place].  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 The patient has one granddaughter. The patient is a [place].     ADVANCED DIRECTIVES: not in place  HEALTH MAINTENANCE: History  Substance Use Topics  . Smoking status: Never Smoker   . Smokeless tobacco: Never Used  . Alcohol Use: Yes     Comment: wine rarely     Colonoscopy:  UTD/ Med[facility] January 2016  PAP:  Bone density: Jan 2013 at [facility], osteopenia  Lipid panel:  UTD, Dr. [name]   Allergies  Allergen Reactions  . Pravachol     Myalgias  (As of visit on 05/17/2013, patient is taking Pravachol, but continues to have muscle pain)  . Zetia [Ezetimibe] Other (See Comments)    Ineffective, elevated liver enzymes    Current Outpatient Prescriptions  Medication Sig Dispense Refill  . Calcium Carbonate-Vitamin D (CALCIUM-VITAMIN D) 600-200 MG-UNIT CAPS Take 2 by mouth daily     . cephALEXin (KEFLEX) 500 MG capsule Take 1 capsule (500 mg total) by mouth 3 (three) times daily. 30 capsule 0  . Cholecalciferol (VITAMIN D-3) 1000 UNITS CAPS Take 1 capsule by mouth daily.    . Coenzyme Q10 (CO  Q-10) 200 MG CAPS Take by mouth 2 (two) times daily.      . fexofenadine (ALLEGRA) 180 MG tablet Take otc as directed    . Multiple Vitamin (MULTIVITAMIN) tablet Take 1 tablet by mouth daily.      Marland Kitchen omeprazole (PRILOSEC) 20 MG capsule TAKE 1 CAPSULE (20 MG TOTAL) BY MOUTH DAILY. 90 capsule 3  . phenazopyridine (PYRIDIUM) 200 MG tablet Take 1 tablet (200 mg total) by mouth 3 (three) times daily as needed for pain. 15 tablet 0  . pravastatin (PRAVACHOL) 80 MG tablet TAKE 1 TABLET (80 MG TOTAL) BY MOUTH DAILY. 90 tablet 3  . tamoxifen (NOLVADEX) 20 MG tablet TAKE 1 TABLET BY MOUTH ONCE A DAY 90 tablet 1   No current facility-administered medications for this visit.    OBJECTIVE: Middle-aged white woman in no acute distress  Filed Vitals:  12/26/14 1418  BP: 130/50  Pulse: 85  Temp: 98.7 F (37.1 C)  Resp: 18     Body mass index is 27.2 kg/(m^2).    ECOG FS: 1 Filed Weights   12/26/14 1418  Weight: 143 lb 14.4 oz (65.273 kg)   Sclerae unicteric,EOMs intact  Oropharynx clear, dentition in good repair  No cervical or supraclavicular adenopathy Lungs clear to auscultation bilaterally Heart regular rate and rhythm Abdomen soft, nontender, positive bowel sounds MSK no focal spinal tenderness, No upper extremity lymphedema  Neuro: nonfocal, well oriented, positive affect Breasts: Status post bilateral mastectomies. No evidence of chest wall recurrence.  Both axillae are benign  LAB RESULTS: Lab Results  Component Value Date   WBC 8.2 12/26/2014   NEUTROABS 5.1 12/26/2014   HGB 12.4 12/26/2014   HCT 37.6 12/26/2014   MCV 86.0 12/26/2014   PLT 264 12/26/2014      Chemistry      Component Value Date/Time   NA 141 03/04/2014 0741   NA 142 05/17/2013 1147   K 4.4 03/04/2014 0741   K 3.9 05/17/2013 1147   CL 103 03/04/2014 0741   CO2 28 03/04/2014 0741   CO2 26 05/17/2013 1147   BUN 17 03/04/2014 0741   BUN 15.4 05/17/2013 1147   CREATININE 1.0 03/04/2014 0741   CREATININE  1.0 05/17/2013 1147      Component Value Date/Time   CALCIUM 9.6 03/04/2014 0741   CALCIUM 9.5 05/17/2013 1147   ALKPHOS 36* 03/04/2014 0741   ALKPHOS 48 05/17/2013 1147   AST 22 03/04/2014 0741   AST 28 05/17/2013 1147   ALT 27 03/04/2014 0741   ALT 44 05/17/2013 1147   BILITOT 0.4 03/04/2014 0741   BILITOT 0.34 05/17/2013 1147       STUDIES: No results found.   ASSESSMENT: 68 y.o. [place] woman   (1)  status post right lumpectomy and sentinel lymph node dissection November 2011 with a positive margin.  These were not cleared with further attempts at margin clearance in December. Left breast biopsy showed radial scar.  (2)  The patient subsequently underwent definitive bilateral mastectomies on 12/02/2010. There was no malignancy on the left side. On the right, there was a T2 N0, stage IIA invasive lobular carcinoma, grade 2, ER positive 97%, PR positive 12%, HER-2/neu negative with MIB-1 of 17%.    (3)  She did not require post mastectomy radiation  (4)  She began tamoxifen February 2012.      PLAN:  Lisvet is doing fine as far as her breast cancer is concerned. She is tolerating tamoxifen well. Generally I continue tamoxifen for total of 10 years, especially since her tumor is a lobular 1. The difference in recurrence versus tamoxifen for 5 years is in the 2-3% range. She will think about this, but she understands my recommendation is for 10 years  We did discuss switching to anastrozole, which would make the treatment briefer, but she is put off by the potential side effects, including worsening of osteopenia  I think she is going to have just plain old-fashioned arthritis in the right hip, but I'm setting her up for plain films today just to remove any doubt. She will benefit from participation in the [place] program as well.  Otherwise she will see Dr. Donne Hazel in 6 months and see me again in 12. Josefine knows to call for any problems that may develop before that  visit.     Kayann Maj C    12/26/2014

## 2014-12-30 ENCOUNTER — Telehealth: Payer: Self-pay

## 2014-12-30 NOTE — Telephone Encounter (Signed)
LMOVM - returning her call re: xray back.  Let her know recent test results were fine and to call clinic if she needs additional information.

## 2015-03-05 ENCOUNTER — Ambulatory Visit: Payer: Medicare Other | Attending: General Surgery | Admitting: Physical Therapy

## 2015-03-05 DIAGNOSIS — M25611 Stiffness of right shoulder, not elsewhere classified: Secondary | ICD-10-CM | POA: Insufficient documentation

## 2015-03-05 DIAGNOSIS — I890 Lymphedema, not elsewhere classified: Secondary | ICD-10-CM | POA: Diagnosis not present

## 2015-03-05 NOTE — Therapy (Signed)
[place], [place], [address] Phone: [phone]   Fax:  [phone]  Physical Therapy Evaluation  Patient Details  Name: Brianna PFEFFERLE MRN: [id number] Date of Birth: 02/25/1946 Referring Provider:  Rolm Bookbinder, MD  Encounter Date: 03/05/2015      PT End of Session - 03/05/15 2111    Visit Number 1   Number of Visits 7   Date for PT Re-Evaluation 04/22/15   PT Start Time 4656   PT Stop Time 1437   PT Time Calculation (min) 50 min   Activity Tolerance Patient tolerated treatment well   Behavior During Therapy [facility] for tasks assessed/performed      Past Medical History  Diagnosis Date  . GERD (gastroesophageal reflux disease)   . Hyperlipidemia   . Osteoporosis     fosamax started 9/08  . Vitamin D deficiency   . Shingles   . Allergic rhinitis   . Breast carcinoma 10/11    right- eventual double mastectomy in 2012    Past Surgical History  Procedure Laterality Date  . Partial hysterectomy  1980's    non cancer, ovaries intact  . Breast biopsy  10/11    invasive and in situ mam carcinoma  . Breast lumpectomy  11/11  . Mastectomy  1/12    double- Dr. Donne Hazel  . Mastectomy, radical Bilateral 11/2010  . Wrist fracture surgery Left     in the 1990's  . Cataract extraction      does not remember when    There were no vitals filed for this visit.  Visit Diagnosis:  Lymphedema of arm - Plan: PT plan of care cert/re-cert  Stiffness of joint, shoulder region, right - Plan: PT plan of care cert/re-cert      Subjective Assessment - 03/05/15 1351    Pertinent History Right lumpectomy November 2011 with subsequent bilateral mastectomies January 2012; benign left breast mass; sentinel node biopsy right side.  Has been seen by [place] in the past for lymphedema and learned manual lymph drainage and did stretching strengthening.  Has been doing okay and now doesn't remember how to do what  she was taught before.    hyperlipidemia.  tolerates tamosifen okay.   Patient Stated Goals make sure the right arm is okay--not swollen.  Would like to be stronger.   Currently in Pain? Yes   Pain Score 3    Pain Location Arm   Pain Orientation Right;Upper   Pain Descriptors / Indicators Aching;Dull   Pain Onset 1 to 4 weeks ago   Aggravating Factors  worse when she gets up in the morning and moves the arm.   Pain Relieving Factors has improved somewhat since onset, but patient is unsure why; did feel more comfortable with arm straight out from her body with lying down            [facility] PT Assessment - 03/05/15 0001    Assessment   Medical Diagnosis right breast cancer, left benign mass, s/p bilateral mastectomy   Onset Date --  2011   Precautions   Precautions Other (comment)   Restrictions   Weight Bearing Restrictions No   Balance Screen   Has the patient fallen in the past 6 months No   Has the patient had a decrease in activity level because of a fear of falling?  No   Is the patient reluctant to leave their home because of a fear of falling?  No   Home  Environment   Living Enviornment Private residence   [place] One level   Prior Function   Level of Independence Independent with basic ADLs;Independent with homemaking with ambulation;Independent with gait   Vocation Part time employment   Vocation Requirements [employer] and does lifting on the job   Leisure walks almost 4 miles/day on her job; plans to join the Y   Observation/Other Assessments   Skin Integrity right posterior forearm raised scar from skin tear   Other Surveys  [place]   Posture/Postural Control   Posture/Postural Control Postural limitations   Postural Limitations Rounded Shoulders;Forward head   ROM / Strength   AROM / PROM / Strength AROM;Strength   AROM   AROM Assessment Site Shoulder   Right/Left Shoulder Right;Left   Right  Shoulder Extension 50 Degrees  feels tight   Right Shoulder Flexion --  WFL   Right Shoulder ABduction --  [facility]   Right Shoulder Internal Rotation 70 Degrees   Right Shoulder External Rotation 90 Degrees   Left Shoulder Extension 50 Degrees   Left Shoulder Flexion --  WFL   Left Shoulder ABduction --  [facility]   Left Shoulder Internal Rotation 85 Degrees   Left Shoulder External Rotation 84 Degrees   Strength   Overall Strength Comments UEs grossly 5/5 throughout   Ambulation/Gait   Ambulation/Gait Yes   Ambulation/Gait Assistance 7: Independent           LYMPHEDEMA/ONCOLOGY QUESTIONNAIRE - 03/05/15 1414    What other symptoms do you have   Are you having pitting edema No   Right Upper Extremity Lymphedema   15 cm Proximal to Olecranon Process 29.4 cm   10 cm Proximal to Olecranon Process 28.8 cm   Olecranon Process 24.3 cm   15 cm Proximal to Ulnar Styloid Process 23.6 cm   10 cm Proximal to Ulnar Styloid Process 21.9 cm   Just Proximal to Ulnar Styloid Process 15.4 cm   Across Hand at [employer] 18.3 cm   At [place] of 2nd Digit 5.6 cm   Left Upper Extremity Lymphedema   15 cm Proximal to Olecranon Process 28.2 cm   10 cm Proximal to Olecranon Process 27.5 cm   Olecranon Process 23.6 cm   15 cm Proximal to Ulnar Styloid Process 22.1 cm   10 cm Proximal to Ulnar Styloid Process 19.6 cm   Just Proximal to Ulnar Styloid Process 14.3 cm   Across Hand at [employer] 17.5 cm   At [place] of 2nd Digit 5.5 cm           Quick Dash - 03/05/15 0001    Open a tight or new jar Mild difficulty   Do heavy household chores (wash walls, wash floors) No difficulty   Carry a shopping bag or briefcase Mild difficulty   Wash your back Moderate difficulty   Use a knife to cut food No difficulty   Recreational activities in which you take some force or impact through your arm, shoulder, or hand (golf, hammering, tennis) Moderate difficulty   During the past week, to what extent  has your arm, shoulder or hand problem interfered with your normal social activities with family, friends, neighbors, or groups? Modererately   During the past week, to what extent has your arm, shoulder or hand problem limited your work or other regular daily activities Modererately   Arm, shoulder, or hand pain. Moderate   Tingling (pins and  needles) in your arm, shoulder, or hand None   Difficulty Sleeping No difficulty   DASH Score 27.27 %                             Long Term Clinic Goals - Mar 28, 2015 2115-03-15    CC Long Term Goal  #1   Title independent in performing self manual lymph drainage correctly   Baseline learned it in the past but hasn't done it in some time   Time 3   Period Weeks   Status New   CC Long Term Goal  #2   Title knowledgeable about where and how to obtain a compression sleeve for right   Baseline had one in the past, but not now; didn't have glove   Time 3   Period Weeks   Status New   CC Long Term Goal  #3   Title independent in strength ABC program   Time 3   Period Weeks   Status New            Plan - 03-28-2015 03/15/11    Clinical Impression Statement Patient with mild right UE lymphedema who has been treated here in the past is ready to refresh her knowledge of self-care, get a new compression sleeve, and exercise.   Pt will benefit from skilled therapeutic intervention in order to improve on the following deficits Increased edema;Decreased range of motion;Impaired UE functional use;Decreased knowledge of use of DME   Rehab Potential Excellent   PT Frequency 1x / week   PT Duration 3 weeks  to 6 weeks as needed (3-6 visits total)   PT Treatment/Interventions Manual lymph drainage;Patient/family education;Therapeutic exercise;ADLs/Self Care Home Management;Other (comment)  assist with obtaining compression sleeve; teach strength ABC program   PT Next Visit Plan review self-manual lymph drainage with patient; begin process of  obtaining new compression sleeve; later, teach strength ABC program   Consulted and Agree with Plan of Care Patient          G-Codes - 2015-03-28 03/14/17    Functional Assessment Tool Used quick dash   Functional Limitation Carrying, moving and handling objects   Carrying, Moving and Handling Objects Current Status [phone]) At least 20 percent but less than 40 percent impaired, limited or restricted   Carrying, Moving and Handling Objects Goal Status ([id number]) At least 1 percent but less than 20 percent impaired, limited or restricted       Problem List Patient Active Problem List   Diagnosis Date Noted  . Breast cancer, right breast 12/26/2014  . Encounter for Medicare annual wellness exam 02/28/2014  . Pre-employment examination 08/29/2013  . BREAST CANCER 10/21/2010  . VITAMIN D DEFICIENCY 10/11/2008  . HYPERLIPIDEMIA 07/07/2007  . GERD 07/07/2007  . Osteopenia 07/07/2007  . FATTY LIVER DISEASE, HX OF 07/07/2007    Dyke Weible 2015-03-28, 9:20 PM  [place], [place], [address] Phone: [phone]   Fax:  [place], PT 03-28-2015 9:20 PM

## 2015-03-12 ENCOUNTER — Ambulatory Visit: Payer: Medicare Other

## 2015-03-12 ENCOUNTER — Telehealth: Payer: Self-pay | Admitting: Family Medicine

## 2015-03-12 DIAGNOSIS — E559 Vitamin D deficiency, unspecified: Secondary | ICD-10-CM

## 2015-03-12 DIAGNOSIS — M8580 Other specified disorders of bone density and structure, unspecified site: Secondary | ICD-10-CM

## 2015-03-12 DIAGNOSIS — E785 Hyperlipidemia, unspecified: Secondary | ICD-10-CM

## 2015-03-12 DIAGNOSIS — M25611 Stiffness of right shoulder, not elsewhere classified: Secondary | ICD-10-CM | POA: Diagnosis not present

## 2015-03-12 DIAGNOSIS — I890 Lymphedema, not elsewhere classified: Secondary | ICD-10-CM

## 2015-03-12 DIAGNOSIS — K760 Fatty (change of) liver, not elsewhere classified: Secondary | ICD-10-CM

## 2015-03-12 DIAGNOSIS — Z0000 Encounter for general adult medical examination without abnormal findings: Secondary | ICD-10-CM

## 2015-03-12 NOTE — Therapy (Signed)
[place], [place], [address] Phone: [phone]   Fax:  [phone]  Physical Therapy Treatment  Patient Details  Name: Brianna [name] MRN: [id number] Date of Birth: 10-Apr-1946 Referring Provider:  Rolm Bookbinder, MD  Encounter Date: 03/12/2015      PT End of Session - 03/12/15 1647    Visit Number 2   Number of Visits 7   Date for PT Re-Evaluation 04/22/15   PT Start Time 4627   PT Stop Time 0350   PT Time Calculation (min) 47 min      Past Medical History  Diagnosis Date  . GERD (gastroesophageal reflux disease)   . Hyperlipidemia   . Osteoporosis     fosamax started 9/08  . Vitamin D deficiency   . Shingles   . Allergic rhinitis   . Breast carcinoma 10/11    right- eventual double mastectomy in 2012    Past Surgical History  Procedure Laterality Date  . Partial hysterectomy  1980's    non cancer, ovaries intact  . Breast biopsy  10/11    invasive and in situ mam carcinoma  . Breast lumpectomy  11/11  . Mastectomy  1/12    double- Dr. Donne Hazel  . Mastectomy, radical Bilateral 11/2010  . Wrist fracture surgery Left     in the 1990's  . Cataract extraction      does not remember when    There were no vitals filed for this visit.  Visit Diagnosis:  Lymphedema of arm  Stiffness of joint, shoulder region, right                       [facility] Adult PT Treatment/Exercise - 03/12/15 0001    Manual Therapy   Manual Lymphatic Drainage (MLD) Short neck, Lt axilla and Rt inguinal nodes, anterior inter-axillary and Rt axillo-inguinal anastosmosis, and Rt UE from fingers/dorsal hand to lateral shoulder reviewing with pt throughout.                 PT Education - 03/12/15 1647    Education provided Yes   Education Details Self manual lymph drainage   Person(s) Educated Patient   Methods Explanation;Demonstration;Tactile cues;Handout   Comprehension  Verbalized understanding;Need further instruction                [facility] Goals - 03/12/15 1704    CC Long Term Goal  #1   Title independent in performing self manual lymph drainage correctly  Instruced in this today, 03/12/15.   Status On-going   CC Long Term Goal  #2   Title knowledgeable about where and how to obtain a compression sleeve for right  Issued information today   Status On-going            Plan - 03/12/15 1700    Clinical Impression Statement Pt re-instructed in self manual lymph drainage today and issued [employer] catalog to pt for her lo look into getting a compression sleeve and possibly a gauntlet from them. Also took circumference measurements to help her with sizing which per the chart in the catalog showed she'd need a small. Pt also verbalized good understanding of self manual lymph drainage after demonstration today.   Pt will benefit from skilled therapeutic intervention in order to improve on the following deficits Increased edema;Decreased range of motion;Impaired UE functional use;Decreased knowledge of use of DME   Rehab Potential Excellent   PT Frequency 1x /  week   PT Duration 3 weeks   PT Treatment/Interventions Manual lymph drainage;Patient/family education;Therapeutic exercise;ADLs/Self Care Home Management;Other (comment)   PT Next Visit Plan review self-manual lymph drainage with patient; continue process of obtaining new compression sleeve/assess if pt got one; later, teach strength ABC program   Consulted and Agree with Plan of Care Patient        Problem List Patient Active Problem List   Diagnosis Date Noted  . Breast cancer, right breast 12/26/2014  . Encounter for Medicare annual wellness exam 02/28/2014  . Pre-employment examination 08/29/2013  . BREAST CANCER 10/21/2010  . VITAMIN D DEFICIENCY 10/11/2008  . HYPERLIPIDEMIA 07/07/2007  . GERD 07/07/2007  . Osteopenia 07/07/2007  . FATTY LIVER DISEASE, HX OF  07/07/2007    Otelia Limes, PTA 03/12/2015, 5:06 PM  [place], [place], [address] Phone: [phone]   Fax:  [phone]

## 2015-03-12 NOTE — Telephone Encounter (Signed)
-----   Message from Ellamae Sia sent at 03/10/2015 12:21 PM EDT ----- Regarding: Lab orders for Thursday, 4.21.16 Patient is scheduled for CPX labs, please order future labs, Thanks , Karna Christmas

## 2015-03-12 NOTE — Patient Instructions (Signed)

## 2015-03-14 ENCOUNTER — Other Ambulatory Visit (INDEPENDENT_AMBULATORY_CARE_PROVIDER_SITE_OTHER): Payer: Medicare Other

## 2015-03-14 DIAGNOSIS — E559 Vitamin D deficiency, unspecified: Secondary | ICD-10-CM

## 2015-03-14 DIAGNOSIS — E785 Hyperlipidemia, unspecified: Secondary | ICD-10-CM

## 2015-03-14 DIAGNOSIS — M8580 Other specified disorders of bone density and structure, unspecified site: Secondary | ICD-10-CM

## 2015-03-14 DIAGNOSIS — Z0000 Encounter for general adult medical examination without abnormal findings: Secondary | ICD-10-CM

## 2015-03-14 DIAGNOSIS — K760 Fatty (change of) liver, not elsewhere classified: Secondary | ICD-10-CM | POA: Diagnosis not present

## 2015-03-14 LAB — LIPID PANEL
Cholesterol: 174 mg/dL (ref 0–200)
HDL: 43.6 mg/dL (ref >39.00)
LDL Cholesterol: 107 mg/dL — ABNORMAL HIGH (ref 0–99)
NonHDL: 130.4
Total CHOL/HDL Ratio: 4
Triglycerides: 117 mg/dL (ref 0.0–149.0)
VLDL: 23.4 mg/dL (ref 0.0–40.0)

## 2015-03-14 LAB — COMPREHENSIVE METABOLIC PANEL
ALBUMIN: 3.7 g/dL (ref 3.5–5.2)
ALT: 24 U/L (ref 0–35)
AST: 20 U/L (ref 0–37)
Alkaline Phosphatase: 41 U/L (ref 39–117)
BUN: 16 mg/dL (ref 6–23)
CALCIUM: 9 mg/dL (ref 8.4–10.5)
CHLORIDE: 106 meq/L (ref 96–112)
CO2: 28 mEq/L (ref 19–32)
CREATININE: 0.95 mg/dL (ref 0.40–1.20)
GFR: 61.66 mL/min (ref >60.00)
GLUCOSE: 130 mg/dL — AB (ref 70–99)
Potassium: 4.3 mEq/L (ref 3.5–5.1)
Sodium: 140 mEq/L (ref 135–145)
Total Bilirubin: 0.6 mg/dL (ref 0.2–1.2)
Total Protein: 6.2 g/dL (ref 6.0–8.3)

## 2015-03-14 LAB — CBC WITH DIFFERENTIAL/PLATELET
BASOS PCT: 0.4 % (ref 0.0–3.0)
Basophils Absolute: 0 10*3/uL (ref 0.0–0.1)
Eosinophils Absolute: 0.1 10*3/uL (ref 0.0–0.7)
Eosinophils Relative: 1 % (ref 0.0–5.0)
HEMATOCRIT: 36.3 % (ref 36.0–46.0)
Hemoglobin: 12.3 g/dL (ref 12.0–15.0)
LYMPHS PCT: 28.4 % (ref 12.0–46.0)
Lymphs Abs: 1.6 10*3/uL (ref 0.7–4.0)
MCHC: 33.9 g/dL (ref 30.0–36.0)
MCV: 84.3 fl (ref 78.0–100.0)
Monocytes Absolute: 0.5 10*3/uL (ref 0.1–1.0)
Monocytes Relative: 8.4 % (ref 3.0–12.0)
NEUTROS ABS: 3.5 10*3/uL (ref 1.4–7.7)
Neutrophils Relative %: 61.8 % (ref 43.0–77.0)
Platelets: 260 10*3/uL (ref 150.0–400.0)
RBC: 4.31 Mil/uL (ref 3.87–5.11)
RDW: 13.6 % (ref 11.5–15.5)
WBC: 5.6 10*3/uL (ref 4.0–10.5)

## 2015-03-14 LAB — VITAMIN D 25 HYDROXY (VIT D DEFICIENCY, FRACTURES): VITD: 37.6 ng/mL (ref 30.00–100.00)

## 2015-03-14 LAB — TSH: TSH: 2.44 u[IU]/mL (ref 0.35–4.50)

## 2015-03-18 ENCOUNTER — Ambulatory Visit: Payer: Medicare Other | Admitting: Physical Therapy

## 2015-03-18 DIAGNOSIS — M25611 Stiffness of right shoulder, not elsewhere classified: Secondary | ICD-10-CM | POA: Diagnosis not present

## 2015-03-18 DIAGNOSIS — I890 Lymphedema, not elsewhere classified: Secondary | ICD-10-CM

## 2015-03-18 NOTE — Therapy (Signed)
[place], [place], [address] Phone: [phone]   Fax:  [phone]  Physical Therapy Treatment  Patient Details  Name: Brianna [name] MRN: [id number] Date of Birth: 15-Nov-1946 Referring Provider:  Abner Greenspan, MD  Encounter Date: 03/18/2015      PT End of Session - 03/18/15 1745    Visit Number 3   Number of Visits 7   Date for PT Re-Evaluation 04/22/15   PT Start Time 1351   PT Stop Time 1433   PT Time Calculation (min) 42 min   Activity Tolerance Patient tolerated treatment well   Behavior During Therapy [facility] for tasks assessed/performed      Past Medical History  Diagnosis Date  . GERD (gastroesophageal reflux disease)   . Hyperlipidemia   . Osteoporosis     fosamax started 9/08  . Vitamin D deficiency   . Shingles   . Allergic rhinitis   . Breast carcinoma 10/11    right- eventual double mastectomy in 2012    Past Surgical History  Procedure Laterality Date  . Partial hysterectomy  1980's    non cancer, ovaries intact  . Breast biopsy  10/11    invasive and in situ mam carcinoma  . Breast lumpectomy  11/11  . Mastectomy  1/12    double- Dr. Donne Hazel  . Mastectomy, radical Bilateral 11/2010  . Wrist fracture surgery Left     in the 1990's  . Cataract extraction      does not remember when    There were no vitals filed for this visit.  Visit Diagnosis:  Lymphedema of arm      Subjective Assessment - 03/18/15 1352    Subjective Nothing new.  Has tried to go over the manual lymph drainage at home, but it doesn't feel as good as having the therapist do it.  Hasn't got the glove yet--plans to have them shipped to her.   Currently in Pain? Yes   Pain Score 1    Pain Location Arm   Pain Orientation Right;Upper   Pain Descriptors / Indicators Other (Comment);Aching  "you know that it's there"   Pain Relieving Factors maybe manual lymph drainage                          [facility] Adult PT Treatment/Exercise - 03/18/15 0001    Manual Therapy   Manual Lymphatic Drainage (MLD) Short neck, Lt axilla and Rt inguinal nodes, anterior inter-axillary and Rt axillo-inguinal anastosmosis, and Rt UE from fingers/dorsal hand to lateral shoulder reviewing with pt throughout. Verbal instruction given for this before and during it being done by therapist; following therapist performing it, patient performed it (abbreviated repetitions) and demonstrated her ability to do it.                PT Education - 03/18/15 1745    Education provided Yes   Education Details self manual lymph drainage   Person(s) Educated Patient   Methods Explanation;Demonstration   Comprehension Returned demonstration                [facility] Goals - 03/18/15 1748    CC Long Term Goal  #1   Status On-going   CC Long Term Goal  #2   Status Achieved   CC Long Term Goal  #3   Status On-going            Plan - 03/18/15  1746    Clinical Impression Statement Patient hasn't gotten compression garments yet.  Pt. demonstrated self-manual lymph drainage today; could probably use another review session for maximum confidence.   Pt will benefit from skilled therapeutic intervention in order to improve on the following deficits Increased edema;Decreased range of motion;Impaired UE functional use;Decreased knowledge of use of DME   Rehab Potential Excellent   PT Treatment/Interventions Manual lymph drainage;Patient/family education   PT Next Visit Plan Have patient perform self-manual lymph drainage again.  Teach strength ABC program.   Consulted and Agree with Plan of Care Patient        Problem List Patient Active Problem List   Diagnosis Date Noted  . Breast cancer, right breast 12/26/2014  . Encounter for Medicare annual wellness exam 02/28/2014  . Pre-employment examination 08/29/2013  . BREAST CANCER 10/21/2010  . Vitamin D  deficiency 10/11/2008  . Hyperlipidemia 07/07/2007  . GERD 07/07/2007  . Osteopenia 07/07/2007  . Fatty liver 07/07/2007    SALISBURY,DONNA 03/18/2015, 5:49 PM  [place], [place], [address] Phone: [phone]   Fax:  [place], PT 03/18/2015 5:49 PM

## 2015-03-21 ENCOUNTER — Encounter: Payer: Self-pay | Admitting: Family Medicine

## 2015-03-21 ENCOUNTER — Ambulatory Visit (INDEPENDENT_AMBULATORY_CARE_PROVIDER_SITE_OTHER): Payer: Medicare Other | Admitting: Family Medicine

## 2015-03-21 VITALS — BP 140/78 | HR 82 | Temp 98.5°F | Ht 61.0 in | Wt 144.5 lb

## 2015-03-21 DIAGNOSIS — E785 Hyperlipidemia, unspecified: Secondary | ICD-10-CM

## 2015-03-21 DIAGNOSIS — E2839 Other primary ovarian failure: Secondary | ICD-10-CM | POA: Insufficient documentation

## 2015-03-21 DIAGNOSIS — G4762 Sleep related leg cramps: Secondary | ICD-10-CM

## 2015-03-21 DIAGNOSIS — R739 Hyperglycemia, unspecified: Secondary | ICD-10-CM

## 2015-03-21 DIAGNOSIS — Z0000 Encounter for general adult medical examination without abnormal findings: Secondary | ICD-10-CM | POA: Diagnosis not present

## 2015-03-21 DIAGNOSIS — R7303 Prediabetes: Secondary | ICD-10-CM | POA: Insufficient documentation

## 2015-03-21 DIAGNOSIS — M8580 Other specified disorders of bone density and structure, unspecified site: Secondary | ICD-10-CM | POA: Diagnosis not present

## 2015-03-21 DIAGNOSIS — E119 Type 2 diabetes mellitus without complications: Secondary | ICD-10-CM | POA: Insufficient documentation

## 2015-03-21 DIAGNOSIS — E559 Vitamin D deficiency, unspecified: Secondary | ICD-10-CM | POA: Diagnosis not present

## 2015-03-21 MED ORDER — PRAVASTATIN SODIUM 80 MG PO TABS: ORAL_TABLET | ORAL | Status: DC

## 2015-03-21 MED ORDER — OMEPRAZOLE 20 MG PO CPDR: DELAYED_RELEASE_CAPSULE | ORAL | Status: DC

## 2015-03-21 NOTE — Progress Notes (Signed)
Subjective:    Patient ID: Brianna [name], female    DOB: 10/04/1946, 69 y.o.   MRN: [id number]  HPI Here for annual medicare wellness visit as well as chronic/acute medical problems   I have personally reviewed the Medicare Annual Wellness questionnaire and have noted 1. The patient's medical and social history 2. Their use of alcohol, tobacco or illicit drugs 3. Their current medications and supplements 4. The patient's functional ability including ADL's, fall risks, home safety risks and hearing or visual             impairment. 5. Diet and physical activities 6. Evidence for depression or mood disorders  The patients weight, height, BMI have been recorded in the chart and visual acuity is per eye clinic.  I have made referrals, counseling and provided education to the patient based review of the above and I have provided the pt with a written personalized care plan for preventive services. Reviewed and updated provider list, see scanned forms.  Doing fairly well overall  In PT for her R arm - ? If it is helping   Has some leg cramps in bed at night    See scanned forms.  Routine anticipatory guidance given to patient.  See health maintenance. Hep C screening - declines/ not high risk , has also donated blood  Colon cancer screening 1/16 nl colonoscopy - 10 year recall  Breast cancer screening has had a double mastectomy and has oncol f/u and surg f/u -on tamoxifen  Self breast exam - no changes in surgical sites - still gets a little tingling  Flu vaccine - did not get one  Tetanus vaccine 11/10  Pneumovax 4/15  Zoster vaccine 1/13  dexa 1/13 -due for that (forgot last year) - no fractures , D is 37  Advance directive - does not have that/ given materials Cognitive function addressed- see scanned forms- and if abnormal then additional documentation follows.  Misplaces things /otherwise ok   PMH and SH reviewed  Meds, vitals, and allergies reviewed.   ROS: See HPI.   Otherwise negative.    Hyperlipidemia Lab Results  Component Value Date   CHOL 174 03/14/2015   CHOL 197 03/04/2014   CHOL 184 10/30/2012   Lab Results  Component Value Date   HDL 43.60 03/14/2015   HDL 42.10 03/04/2014   HDL 43.60 10/30/2012   Lab Results  Component Value Date   LDLCALC 107* 03/14/2015   LDLCALC 109* 03/04/2014   LDLCALC 88 04/28/2012   Lab Results  Component Value Date   TRIG 117.0 03/14/2015   TRIG 229.0* 03/04/2014   TRIG 205.0* 10/30/2012   Lab Results  Component Value Date   CHOLHDL 4 03/14/2015   CHOLHDL 5 03/04/2014   CHOLHDL 4 10/30/2012   Lab Results  Component Value Date   LDLDIRECT 119.9 10/30/2012   LDLDIRECT 136.2 11/02/2011   LDLDIRECT 185.7 08/03/2011   pravastatin and diet  Is improved this time   Results for orders placed or performed in visit on 03/14/15  CBC with Differential/Platelet  Result Value Ref Range   WBC 5.6 4.0 - 10.5 K/uL   RBC 4.31 3.87 - 5.11 Mil/uL   Hemoglobin 12.3 12.0 - 15.0 g/dL   HCT 36.3 36.0 - 46.0 %   MCV 84.3 78.0 - 100.0 fl   MCHC 33.9 30.0 - 36.0 g/dL   RDW 13.6 11.5 - 15.5 %   Platelets 260.0 150.0 - 400.0 K/uL   Neutrophils Relative % 61.8  43.0 - 77.0 %   Lymphocytes Relative 28.4 12.0 - 46.0 %   Monocytes Relative 8.4 3.0 - 12.0 %   Eosinophils Relative 1.0 0.0 - 5.0 %   Basophils Relative 0.4 0.0 - 3.0 %   Neutro Abs 3.5 1.4 - 7.7 K/uL   Lymphs Abs 1.6 0.7 - 4.0 K/uL   Monocytes Absolute 0.5 0.1 - 1.0 K/uL   Eosinophils Absolute 0.1 0.0 - 0.7 K/uL   Basophils Absolute 0.0 0.0 - 0.1 K/uL  Comprehensive metabolic panel  Result Value Ref Range   Sodium 140 135 - 145 mEq/L   Potassium 4.3 3.5 - 5.1 mEq/L   Chloride 106 96 - 112 mEq/L   CO2 28 19 - 32 mEq/L   Glucose, Bld 130 (H) 70 - 99 mg/dL   BUN 16 6 - 23 mg/dL   Creatinine, Ser 0.95 0.40 - 1.20 mg/dL   Total Bilirubin 0.6 0.2 - 1.2 mg/dL   Alkaline Phosphatase 41 39 - 117 U/L   AST 20 0 - 37 U/L   ALT 24 0 - 35 U/L   Total  Protein 6.2 6.0 - 8.3 g/dL   Albumin 3.7 3.5 - 5.2 g/dL   Calcium 9.0 8.4 - 10.5 mg/dL   GFR 61.66 >60.00 mL/min  Lipid panel  Result Value Ref Range   Cholesterol 174 0 - 200 mg/dL   Triglycerides 117.0 0.0 - 149.0 mg/dL   HDL 43.60 >39.00 mg/dL   VLDL 23.4 0.0 - 40.0 mg/dL   LDL Cholesterol 107 (H) 0 - 99 mg/dL   Total CHOL/HDL Ratio 4    NonHDL 130.40   TSH  Result Value Ref Range   TSH 2.44 0.35 - 4.50 uIU/mL  Vit D  25 hydroxy (rtn osteoporosis monitoring)  Result Value Ref Range   VITD 37.60 30.00 - 100.00 ng/mL    Blood glucose is 130 fasting  Will watch this  She eats sweets in phases- not a sweet tooth  Does drink some pepsi   Exercise - walks 4 mi per day   Wt is stable with bmi of 27   Patient Active Problem List   Diagnosis Date Noted  . Pre-employment examination 08/29/2013    Priority: Medium  . Hyperglycemia 03/21/2015  . Breast cancer, right breast 12/26/2014  . Encounter for Medicare annual wellness exam 02/28/2014  . BREAST CANCER 10/21/2010  . Vitamin D deficiency 10/11/2008  . Hyperlipidemia 07/07/2007  . GERD 07/07/2007  . Osteopenia 07/07/2007  . Fatty liver 07/07/2007   Past Medical History  Diagnosis Date  . GERD (gastroesophageal reflux disease)   . Hyperlipidemia   . Osteoporosis     fosamax started 9/08  . Vitamin D deficiency   . Shingles   . Allergic rhinitis   . Breast carcinoma 10/11    right- eventual double mastectomy in 2012   Past Surgical History  Procedure Laterality Date  . Partial hysterectomy  1980's    non cancer, ovaries intact  . Breast biopsy  10/11    invasive and in situ mam carcinoma  . Breast lumpectomy  11/11  . Mastectomy  1/12    double- Dr. Donne Hazel  . Mastectomy, radical Bilateral 11/2010  . Wrist fracture surgery Left     in the 1990's  . Cataract extraction      does not remember when   History  Substance Use Topics  . Smoking status: Never Smoker   . Smokeless tobacco: Never Used  .  Alcohol Use: 0.0  oz/week    0 Standard drinks or equivalent per week     Comment: wine rarely   Family History  Problem Relation Age of Onset  . Dementia Mother   . Cancer Father     esophageal  . Diabetes Brother   . Cancer Paternal Aunt     ovarian   Allergies  Allergen Reactions  . Pravachol     Myalgias  (As of visit on 05/17/2013, patient is taking Pravachol, but continues to have muscle pain)  . Zetia [Ezetimibe] Other (See Comments)    Ineffective, elevated liver enzymes   Current Outpatient Prescriptions on File Prior to Visit  Medication Sig Dispense Refill  . Calcium Carbonate-Vitamin D (CALCIUM-VITAMIN D) 600-200 MG-UNIT CAPS Take 2 by mouth daily     . Cholecalciferol (VITAMIN D-3) 1000 UNITS CAPS Take 1 capsule by mouth daily.    . Coenzyme Q10 (CO Q-10) 200 MG CAPS Take by mouth 2 (two) times daily.      . fexofenadine (ALLEGRA) 180 MG tablet Take otc as directed    . Multiple Vitamin (MULTIVITAMIN) tablet Take 1 tablet by mouth daily.      Marland Kitchen omeprazole (PRILOSEC) 20 MG capsule TAKE 1 CAPSULE (20 MG TOTAL) BY MOUTH DAILY. 90 capsule 3  . pravastatin (PRAVACHOL) 80 MG tablet TAKE 1 TABLET (80 MG TOTAL) BY MOUTH DAILY. 90 tablet 3  . tamoxifen (NOLVADEX) 20 MG tablet Take 1 tablet (20 mg total) by mouth daily. 90 tablet 1   No current facility-administered medications on file prior to visit.      Review of Systems Review of Systems  Constitutional: Negative for fever, appetite change, fatigue and unexpected weight change.  Eyes: Negative for pain and visual disturbance.  Respiratory: Negative for cough and shortness of breath.   Cardiovascular: Negative for cp or palpitations    Gastrointestinal: Negative for nausea, diarrhea and constipation.  Genitourinary: Negative for urgency and frequency.  Skin: Negative for pallor or rash   MSK pos for leg cramping  Neurological: Negative for weakness, light-headedness, numbness and headaches.  Hematological: Negative  for adenopathy. Does not bruise/bleed easily.  Psychiatric/Behavioral: Negative for dysphoric mood. The patient is not nervous/anxious.         Objective:   Physical Exam  Constitutional: She appears well-developed and well-nourished. No distress.  HENT:  Head: Normocephalic and atraumatic.  Right Ear: External ear normal.  Left Ear: External ear normal.  Mouth/Throat: Oropharynx is clear and moist.  Eyes: Conjunctivae and EOM are normal. Pupils are equal, round, and reactive to light. No scleral icterus.  Neck: Normal range of motion. Neck supple. No JVD present. Carotid bruit is not present. No thyromegaly present.  Cardiovascular: Normal rate, regular rhythm, normal heart sounds and intact distal pulses.  Exam reveals no gallop.   Pulmonary/Chest: Effort normal and breath sounds normal. No respiratory distress. She has no wheezes. She exhibits no tenderness.  Abdominal: Soft. Bowel sounds are normal. She exhibits no distension, no abdominal bruit and no mass. There is no tenderness.  Genitourinary:  bilat masectomy sites -nl by inspection and palpation     Musculoskeletal: Normal range of motion. She exhibits no edema or tenderness.  No kyphosis   Lymphadenopathy:    She has no cervical adenopathy.  Neurological: She is alert. She has normal reflexes. No cranial nerve deficit. She exhibits normal muscle tone. Coordination normal.  Skin: Skin is warm and dry. No rash noted. No erythema. No pallor.  Psychiatric: She has a normal  mood and affect.          Assessment & Plan:   Problem List Items Addressed This Visit      Musculoskeletal and Integument   Osteopenia    Disc need for calcium/ vitamin D/ wt bearing exercise and bone density test every 2 y to monitor Disc safety/ fracture risk in detail   On tamoxifen  Ref for f/u dexa  No falls or fx         Other   Encounter for Medicare annual wellness exam    Reviewed health habits including diet and exercise and skin  cancer prevention Reviewed appropriate screening tests for age  Also reviewed health mt list, fam hx and immunization status , as well as social and family history   See HPI  Labs rev Stop up front for referral for bone density test  Follow up in 6 months for blood sugar with labs prior (eat low sugar / get rid of sugar drinks and watch sweets)  Please work on an advance directive -the materials I gave you        Estrogen deficiency    Ref for dexa       Relevant Orders   DG Bone Density   Hyperglycemia - Primary    Mildly elevated fasting glucose reading Disc low glycemic diet and exercise  Will check A1C for next visit       Hyperlipidemia    Disc goals for lipids and reasons to control them Rev labs with pt Rev low sat fat diet in detail On pravastatin and diet - improved some       Relevant Medications   pravastatin (PRAVACHOL) 80 MG tablet   Nocturnal leg cramps    With nl labs Recommended stretching  Also diet tonic water , and magnesium suppl 250 to 500 daily as tolerated       Vitamin D deficiency    Fair level at 37 with current dosage  Will likely go up when outdoors more in the summer Disc imp to bone and overall health

## 2015-03-21 NOTE — Patient Instructions (Addendum)
For nocturnal leg cramps - try 4 oz of diet tonic water daily , and also otc magnesium 250-500 mg may help  Also stretch legs and feet  Stop up front for referral for bone density test  Follow up in 6 months for blood sugar with labs prior (eat low sugar / get rid of sugar drinks and watch sweets)  Please work on an advance directive -the materials I gave you

## 2015-03-21 NOTE — Progress Notes (Signed)
Pre visit review using our clinic review tool, if applicable. No additional management support is needed unless otherwise documented below in the visit note. 

## 2015-03-23 NOTE — Assessment & Plan Note (Signed)
Mildly elevated fasting glucose reading Disc low glycemic diet and exercise  Will check A1C for next visit

## 2015-03-23 NOTE — Assessment & Plan Note (Signed)
Ref for dexa 

## 2015-03-23 NOTE — Assessment & Plan Note (Signed)
Reviewed health habits including diet and exercise and skin cancer prevention Reviewed appropriate screening tests for age  Also reviewed health mt list, fam hx and immunization status , as well as social and family history   See HPI  Labs rev Stop up front for referral for bone density test  Follow up in 6 months for blood sugar with labs prior (eat low sugar / get rid of sugar drinks and watch sweets)  Please work on an advance directive -the materials I gave you

## 2015-03-23 NOTE — Assessment & Plan Note (Signed)
Disc need for calcium/ vitamin D/ wt bearing exercise and bone density test every 2 y to monitor Disc safety/ fracture risk in detail   On tamoxifen  Ref for f/u dexa  No falls or fx

## 2015-03-23 NOTE — Assessment & Plan Note (Signed)
With nl labs Recommended stretching  Also diet tonic water , and magnesium suppl 250 to 500 daily as tolerated

## 2015-03-23 NOTE — Assessment & Plan Note (Signed)
Fair level at 37 with current dosage  Will likely go up when outdoors more in the summer Disc imp to bone and overall health

## 2015-03-23 NOTE — Assessment & Plan Note (Signed)
Disc goals for lipids and reasons to control them Rev labs with pt Rev low sat fat diet in detail On pravastatin and diet - improved some

## 2015-03-24 ENCOUNTER — Ambulatory Visit: Payer: Medicare Other | Attending: General Surgery | Admitting: Physical Therapy

## 2015-03-24 DIAGNOSIS — I890 Lymphedema, not elsewhere classified: Secondary | ICD-10-CM | POA: Diagnosis not present

## 2015-03-24 DIAGNOSIS — M25611 Stiffness of right shoulder, not elsewhere classified: Secondary | ICD-10-CM | POA: Diagnosis not present

## 2015-03-24 NOTE — Therapy (Signed)
[place], [place], [address] Phone: [phone]   Fax:  [phone]  Physical Therapy Treatment  Patient Details  Name: Brianna [name] MRN: [id number] Date of Birth: [birth year] Referring Provider:  Rolm Bookbinder, MD  Encounter Date: 03/24/2015      PT End of Session - 03/24/15 1654    Visit Number 4   Number of Visits 7   Date for PT Re-Evaluation 04/22/15   PT Start Time 5035   PT Stop Time 1650   PT Time Calculation (min) 40 min   Activity Tolerance Patient tolerated treatment well   Behavior During Therapy [facility] for tasks assessed/performed      Past Medical History  Diagnosis Date  . GERD (gastroesophageal reflux disease)   . Hyperlipidemia   . Osteoporosis     fosamax started 9/08  . Vitamin D deficiency   . Shingles   . Allergic rhinitis   . Breast carcinoma 10/11    right- eventual double mastectomy in 2012    Past Surgical History  Procedure Laterality Date  . Partial hysterectomy  1980's    non cancer, ovaries intact  . Breast biopsy  10/11    invasive and in situ mam carcinoma  . Breast lumpectomy  11/11  . Mastectomy  1/12    double- Dr. Donne Hazel  . Mastectomy, radical Bilateral 11/2010  . Wrist fracture surgery Left     in the 1990's  . Cataract extraction      does not remember when    There were no vitals filed for this visit.  Visit Diagnosis:  Lymphedema of arm      Subjective Assessment - 03/24/15 1612    Subjective Right thumb has reduced in size.  Pt. has done the manual lymph drainage at home.  I have to get my cheat notes out.     Currently in Pain? Yes   Pain Score 2    Pain Location Arm   Pain Orientation Right;Upper   Pain Descriptors / Indicators Aching;Tightness   Aggravating Factors  worse in a.m.   Pain Relieving Factors Aleve helps but patient is afraid to take it                         [facility] Adult PT  Treatment/Exercise - 03/24/15 0001    Manual Therapy   Manual Lymphatic Drainage (MLD) Deep breathing, short neck, left axilla and anterior interaxillary anastomosis, right groin and axillo-inguinal anastomosis, and right UE from dorsal hand to shoulder; in left sidelying, posterior interaxillary anastomosis from right to left; in supine again, superficial and deep abdomen.                         [facility] Goals - 03/24/15 1655    CC Long Term Goal  #1   Status Achieved   CC Long Term Goal  #3   Status On-going            Plan - 03/24/15 1654    Clinical Impression Statement Patient notes improvement in right hand swelling.  Feels she understands how to do manual lymph drainage.  Still hasn't called to order compression garments.   Pt will benefit from skilled therapeutic intervention in order to improve on the following deficits Increased edema;Decreased range of motion;Impaired UE functional use;Decreased knowledge of use of DME   Rehab Potential Excellent   PT Frequency 2x /  week  2-3 more visits total   PT Treatment/Interventions Manual lymph drainage;Patient/family education   PT Next Visit Plan Remeasure.  Review MLD as needed.  Teach strength ABC program.        Problem List Patient Active Problem List   Diagnosis Date Noted  . Hyperglycemia 03/21/2015  . Nocturnal leg cramps 03/21/2015  . Estrogen deficiency 03/21/2015  . Breast cancer, right breast 12/26/2014  . Encounter for Medicare annual wellness exam 02/28/2014  . Pre-employment examination 08/29/2013  . BREAST CANCER 10/21/2010  . Vitamin D deficiency 10/11/2008  . Hyperlipidemia 07/07/2007  . GERD 07/07/2007  . Osteopenia 07/07/2007  . Fatty liver 07/07/2007    Nguyet Mercer 03/24/2015, 4:57 PM  [place], [place], [address] Phone: [phone]   Fax:  [place],  PT 03/24/2015 4:57 PM

## 2015-03-26 ENCOUNTER — Ambulatory Visit: Payer: Medicare Other | Admitting: Physical Therapy

## 2015-03-26 DIAGNOSIS — I890 Lymphedema, not elsewhere classified: Secondary | ICD-10-CM

## 2015-03-26 DIAGNOSIS — M25611 Stiffness of right shoulder, not elsewhere classified: Secondary | ICD-10-CM

## 2015-03-26 NOTE — Therapy (Signed)
[place], [place], [address] Phone: [phone]   Fax:  [phone]  Physical Therapy Treatment  Patient Details  Name: Brianna [name] MRN: [id number] Date of Birth: 04-30-1946 Referring Provider:  Rolm Bookbinder, MD  Encounter Date: 03/26/2015      PT End of Session - 03/26/15 1700    Visit Number 5   Number of Visits 7   Date for PT Re-Evaluation 04/22/15   PT Start Time 1610   PT Stop Time 1651   PT Time Calculation (min) 41 min   Activity Tolerance Patient tolerated treatment well   Behavior During Therapy [facility] for tasks assessed/performed      Past Medical History  Diagnosis Date  . GERD (gastroesophageal reflux disease)   . Hyperlipidemia   . Osteoporosis     fosamax started 9/08  . Vitamin D deficiency   . Shingles   . Allergic rhinitis   . Breast carcinoma 10/11    right- eventual double mastectomy in 2012    Past Surgical History  Procedure Laterality Date  . Partial hysterectomy  1980's    non cancer, ovaries intact  . Breast biopsy  10/11    invasive and in situ mam carcinoma  . Breast lumpectomy  11/11  . Mastectomy  1/12    double- Dr. Donne Hazel  . Mastectomy, radical Bilateral 11/2010  . Wrist fracture surgery Left     in the 1990's  . Cataract extraction      does not remember when    There were no vitals filed for this visit.  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  Ordered the sleeve and glove yesterday.   Currently in Pain? Yes   Pain Score 2    Pain Location Abdomen   Pain Descriptors / Indicators Sore   Pain Onset Yesterday   Aggravating Factors  pt. questions whether the abdominal work with manual lymph drainage could have caused soreness; moving around, mashing on the abdomen                LYMPHEDEMA/ONCOLOGY QUESTIONNAIRE - 03/26/15 1613    Right Upper Extremity Lymphedema   15 cm Proximal to Olecranon Process 28.7 cm   10 cm Proximal to Olecranon Process 28.3 cm   Olecranon Process 23.5 cm   15 cm Proximal to Ulnar Styloid Process 23.1 cm   10 cm Proximal to Ulnar Styloid Process 21.2 cm   Just Proximal to Ulnar Styloid Process 14.7 cm   Across Hand at [employer] 17.7 cm   At [place] of 2nd Digit 5.5 cm                  [facility] Adult PT Treatment/Exercise - 03/26/15 0001    Exercises   Exercises Shoulder   Shoulder Exercises: ROM/Strengthening   Other ROM/Strengthening Exercises All strength ABC stretches done:  chest, shoulder tricep, calf, hamstring, hip (figure 4), quads, back, each x 15 seconds; core strengthening:  bridge, clam, superwoman x 10 reps each   Manual Therapy   Manual Therapy Edema management   Edema Management circumference measurements taken                PT Education - 03/26/15 1659    Education provided Yes   Education Details strength ABC program stretches and  core strengthening instructed today; showed and gave patient handout and instructed in log   Person(s) Educated Patient   Methods Explanation;Demonstration;Handout   Comprehension Verbalized understanding;Returned demonstration                [facility] - 03/26/15 1702    CC Long Term Goal  #3   Status On-going            Plan - 03/26/15 1701    Clinical Impression Statement Patient showed nice reductions in right arm circumferences today at all levels.  Did well with beginning instruction in strength ABC program (stretches and core exercises).   Pt will benefit from skilled therapeutic intervention in order to improve on the following deficits Increased edema;Decreased range of motion;Impaired UE functional use;Decreased knowledge of use of DME   Rehab Potential Excellent   PT Frequency 1x / week   PT Duration 3 weeks   PT  Treatment/Interventions Manual techniques;Therapeutic exercise   PT Next Visit Plan Continue strength ABC instruction.  Review MLD as needed.   Consulted and Agree with Plan of Care Patient        Problem List Patient Active Problem List   Diagnosis Date Noted  . Hyperglycemia 03/21/2015  . Nocturnal leg cramps 03/21/2015  . Estrogen deficiency 03/21/2015  . Breast cancer, right breast 12/26/2014  . Encounter for Medicare annual wellness exam 02/28/2014  . Pre-employment examination 08/29/2013  . BREAST CANCER 10/21/2010  . Vitamin D deficiency 10/11/2008  . Hyperlipidemia 07/07/2007  . GERD 07/07/2007  . Osteopenia 07/07/2007  . Fatty liver 07/07/2007    Bhavesh Vazquez 03/26/2015, 5:05 PM  [place], [place], [address] Phone: [phone]   Fax:  [place], PT 03/26/2015 5:05 PM

## 2015-04-02 ENCOUNTER — Ambulatory Visit: Payer: Medicare Other

## 2015-04-02 DIAGNOSIS — I890 Lymphedema, not elsewhere classified: Secondary | ICD-10-CM | POA: Diagnosis not present

## 2015-04-02 DIAGNOSIS — M25611 Stiffness of right shoulder, not elsewhere classified: Secondary | ICD-10-CM | POA: Diagnosis not present

## 2015-04-02 NOTE — Therapy (Signed)
[place], [place], [address] Phone: [phone]   Fax:  [phone]  Physical Therapy Treatment  Patient Details  Name: Brianna [name] MRN: [id number] Date of Birth: 05-Jun-1946 Referring Provider:  Rolm Bookbinder, MD  Encounter Date: 04/02/2015      PT End of Session - 04/02/15 1608    Visit Number 6   Number of Visits 7   Date for PT Re-Evaluation 04/22/15   PT Start Time 1522   PT Stop Time 1607   PT Time Calculation (min) 45 min      Past Medical History  Diagnosis Date  . GERD (gastroesophageal reflux disease)   . Hyperlipidemia   . Osteoporosis     fosamax started 9/08  . Vitamin D deficiency   . Shingles   . Allergic rhinitis   . Breast carcinoma 10/11    right- eventual double mastectomy in 2012    Past Surgical History  Procedure Laterality Date  . Partial hysterectomy  1980's    non cancer, ovaries intact  . Breast biopsy  10/11    invasive and in situ mam carcinoma  . Breast lumpectomy  11/11  . Mastectomy  1/12    double- Dr. Donne Hazel  . Mastectomy, radical Bilateral 11/2010  . Wrist fracture surgery Left     in the 1990's  . Cataract extraction      does not remember when    There were no vitals filed for this visit.  Visit Diagnosis:  Lymphedema of arm  Stiffness of joint, shoulder region, right      Subjective Assessment - 04/02/15 1524    Subjective Took a 12 hr Aleve this morning so feel pretty good today.    Currently in Pain? No/denies                         [facility] Adult PT Treatment/Exercise - 04/02/15 0001    Shoulder Exercises: ROM/Strengthening   Other ROM/Strengthening Exercises Remaining Strength exercises not completed last visit from Strength ABC Packet: Squats, One armed row 2 lbs, bil hip abduction, bil UE scaption against wall 2 lbs, 6" step  ups, bil tricep kickbacks 2 lbs, calf raises holding 2 lbs, bil bicep curls at  wall 2 lbs and supine chest press 2 lbs; all 10 reps                        [facility] - 04/02/15 1620    CC Long Term Goal  #3   Title independent in strength ABC program  Pt has been instructed in this and demonstrated good technique.   Status Partially Met            Plan - 04/02/15 1614    Clinical Impression Statement Pt did well with remaining instruction of Strength ABC program demonstrating safe and proper technique. She reports has received her sleeve/gauntlet but hasnt worn it yet. Will bring it to next appt so we can assess for correct fit. Pt wants to come for 1 more visit so we can review all with her as well.   Pt will benefit from skilled therapeutic intervention in order to improve on the following deficits Increased edema;Decreased range of motion;Impaired UE functional use;Decreased knowledge of use of DME   Rehab Potential Excellent   PT Frequency 1x / week   PT Duration 3 weeks   PT Treatment/Interventions Manual techniques;Therapeutic exercise  PT Next Visit Plan Pt ready for discharge next visit. Review HEP and self manual lymph drainage; assess fit of compression sleeve/gauntelt if pt brings it.   Consulted and Agree with Plan of Care Patient        Problem List Patient Active Problem List   Diagnosis Date Noted  . Hyperglycemia 03/21/2015  . Nocturnal leg cramps 03/21/2015  . Estrogen deficiency 03/21/2015  . Breast cancer, right breast 12/26/2014  . Encounter for Medicare annual wellness exam 02/28/2014  . Pre-employment examination 08/29/2013  . BREAST CANCER 10/21/2010  . Vitamin D deficiency 10/11/2008  . Hyperlipidemia 07/07/2007  . GERD 07/07/2007  . Osteopenia 07/07/2007  . Fatty liver 07/07/2007    Otelia Limes, PTA 04/02/2015, 4:21 PM  [place], [place], [address] Phone: [phone]   Fax:  [phone]

## 2015-04-09 ENCOUNTER — Other Ambulatory Visit: Payer: Self-pay | Admitting: Family Medicine

## 2015-04-16 ENCOUNTER — Ambulatory Visit: Payer: Medicare Other

## 2015-04-16 DIAGNOSIS — M25611 Stiffness of right shoulder, not elsewhere classified: Secondary | ICD-10-CM | POA: Diagnosis not present

## 2015-04-16 DIAGNOSIS — I890 Lymphedema, not elsewhere classified: Secondary | ICD-10-CM

## 2015-04-16 NOTE — Therapy (Signed)
[place], [place], [address] Phone: [phone]   Fax:  [phone]  Physical Therapy Treatment  Patient Details  Name: Brianna [name] MRN: [id number] Date of Birth: Dec 23, 1945 Referring Provider:  Rolm Bookbinder, MD  Encounter Date: 04/16/2015      PT End of Session - 04/16/15 1704    Visit Number 7   Number of Visits 7   Date for PT Re-Evaluation 04/22/15   PT Start Time 1606   PT Stop Time 1652   PT Time Calculation (min) 46 min      Past Medical History  Diagnosis Date  . GERD (gastroesophageal reflux disease)   . Hyperlipidemia   . Osteoporosis     fosamax started 9/08  . Vitamin D deficiency   . Shingles   . Allergic rhinitis   . Breast carcinoma 10/11    right- eventual double mastectomy in 2012    Past Surgical History  Procedure Laterality Date  . Partial hysterectomy  1980's    non cancer, ovaries intact  . Breast biopsy  10/11    invasive and in situ mam carcinoma  . Breast lumpectomy  11/11  . Mastectomy  1/12    double- Dr. Donne Hazel  . Mastectomy, radical Bilateral 11/2010  . Wrist fracture surgery Left     in the 1990's  . Cataract extraction      does not remember when    There were no vitals filed for this visit.  Visit Diagnosis:  Lymphedema of arm  Stiffness of joint, shoulder region, right      Subjective Assessment - 04/16/15 1617    Subjective Been doing okay, except just very fatigued after work. Doing the self manual lymph drainage about every 2 days and thats going really well. My Rt shoulder pain is finally gone too, thats been feeling really good! Got my compression sleeve/gauntlet but havent worn them yet.    Currently in Pain? No/denies               LYMPHEDEMA/ONCOLOGY QUESTIONNAIRE - 04/16/15 1635    Right Upper Extremity Lymphedema   15 cm Proximal to Olecranon Process 28.2 cm   10 cm Proximal to Olecranon Process 27.8 cm   Olecranon Process 23.6 cm   15 cm Proximal to Ulnar Styloid Process 22.9 cm   10 cm Proximal to Ulnar Styloid Process 20.8 cm   Just Proximal to Ulnar Styloid Process 14.7 cm   Across Hand at [employer] 17.7 cm   At [place] of 2nd Digit 5.7 cm           [facility] [place] - 04/16/15 0001    Open a tight or new jar No difficulty   Do heavy household chores (wash walls, wash floors) No difficulty   Carry a shopping bag or briefcase Mild difficulty   Wash your back Moderate difficulty   Use a knife to cut food No difficulty   Recreational activities in which you take some force or impact through your arm, shoulder, or hand (golf, hammering, tennis) Mild difficulty   During the past week, to what extent has your arm, shoulder or hand problem interfered with your normal social activities with family, friends, neighbors, or groups? Not at all   During the past week, to what extent has your arm, shoulder or hand problem limited your work or other regular daily activities Not at all   Arm, shoulder, or hand pain. None   Tingling (pins  and needles) in your arm, shoulder, or hand None   Difficulty Sleeping No difficulty   DASH Score 9.09 %               [facility] Adult PT Treatment/Exercise - 04/16/15 0001    Manual Therapy   Edema Management Circmference measurements taken; also assessed fit of pts new compression sleeve (she forgot to bring gauntlet) and the fit overall was good and pt reported this feeling comfirtable except fit is a little long, so pt to try folding over top of sleeve as this felt a little loose. Also instructed pt to be mindful that this may be a ltoo much compression there and she will watch for that, but that made sleeve a better fit. Also instructed pt in getting rubber kitchen gloves for assist with donning her sleeve.                 PT Education - 04/16/15 1700    Education provided Yes   Education Details Verbally reviewed HEP with pt answering her questions  regarding frequency (overall Strength ABC Program 2x/week, but LE strengthening more frequently to help with her c/o LE weakness). Also discussed beginning a walking routine this summer after school is out and pt wont be getting her walking in at work, and exercising in the pool. Pt was nervous about wearing a bathing suit reporting she hadnt found one that she felt comfortable in, so therapist suggested her getting a Silkie, or UV protected swim shirt and pt was very excited about that because she loves to exercise in the pool during the summer, also instructed her in how that would be beneficial to her keeping her lymphedema controlled.    Person(s) Educated Patient   Methods Explanation   Comprehension Verbalized understanding                [facility] Goals - 04/16/15 1623    CC Long Term Goal  #3   Title independent in strength ABC program   Status Achieved            Plan - 04/16/15 1705    Clinical Impression Statement Pt has done very well with this episode of care. She is independent with her HEP and compliant with Maintenance Phase of Treament. Pt is ready for discharge.    Pt will benefit from skilled therapeutic intervention in order to improve on the following deficits Increased edema;Decreased range of motion;Impaired UE functional use;Decreased knowledge of use of DME   Rehab Potential Excellent   PT Frequency 1x / week   PT Duration 3 weeks   PT Treatment/Interventions Manual techniques;Therapeutic exercise   PT Next Visit Plan Discharge visit.   Consulted and Agree with Plan of Care Patient        Problem List Patient Active Problem List   Diagnosis Date Noted  . Hyperglycemia 03/21/2015  . Nocturnal leg cramps 03/21/2015  . Estrogen deficiency 03/21/2015  . Breast cancer, right breast 12/26/2014  . Encounter for Medicare annual wellness exam 02/28/2014  . Pre-employment examination 08/29/2013  . BREAST CANCER 10/21/2010  . Vitamin D deficiency  10/11/2008  . Hyperlipidemia 07/07/2007  . GERD 07/07/2007  . Osteopenia 07/07/2007  . Fatty liver 07/07/2007    Otelia Limes, PTA 04/16/2015, 5:07 PM  [place], [place], [address] Phone: [phone]   Fax:  [phone]

## 2015-04-17 NOTE — Therapy (Signed)
[place], [place], [address] Phone: [phone]   Fax:  [phone]  Physical Therapy Treatment  Patient Details  Name: Brianna [name] MRN: [id number] Date of Birth: Nov 02, 1946 Referring Provider:  Rolm Bookbinder, MD  Encounter Date: 04/16/2015      PT End of Session - 04/16/15 1704    Visit Number 7   Number of Visits 7   Date for PT Re-Evaluation 04/22/15   PT Start Time 1606   PT Stop Time 1652   PT Time Calculation (min) 46 min      Past Medical History  Diagnosis Date  . GERD (gastroesophageal reflux disease)   . Hyperlipidemia   . Osteoporosis     fosamax started 9/08  . Vitamin D deficiency   . Shingles   . Allergic rhinitis   . Breast carcinoma 10/11    right- eventual double mastectomy in 2012    Past Surgical History  Procedure Laterality Date  . Partial hysterectomy  1980's    non cancer, ovaries intact  . Breast biopsy  10/11    invasive and in situ mam carcinoma  . Breast lumpectomy  11/11  . Mastectomy  1/12    double- Dr. Donne Hazel  . Mastectomy, radical Bilateral 11/2010  . Wrist fracture surgery Left     in the 1990's  . Cataract extraction      does not remember when    There were no vitals filed for this visit.  Visit Diagnosis:  Lymphedema of arm  Stiffness of joint, shoulder region, right      Subjective Assessment - 04/16/15 1617    Subjective Been doing okay, except just very fatigued after work. Doing the self manual lymph drainage about every 2 days and thats going really well. My Rt shoulder pain is finally gone too, thats been feeling really good! Got my compression sleeve/gauntlet but havent worn them yet.    Currently in Pain? No/denies               LYMPHEDEMA/ONCOLOGY QUESTIONNAIRE - 04/16/15 1635    Right Upper Extremity Lymphedema   15 cm Proximal to Olecranon Process 28.2 cm   10 cm Proximal to Olecranon Process 27.8 cm   Olecranon Process 23.6 cm   15 cm Proximal to Ulnar Styloid Process 22.9 cm   10 cm Proximal to Ulnar Styloid Process 20.8 cm   Just Proximal to Ulnar Styloid Process 14.7 cm   Across Hand at [employer] 17.7 cm   At [place] of 2nd Digit 5.7 cm                          PT Education - 04/16/15 1700    Education provided Yes   Education Details Verbally reviewed HEP with pt answering her questions regarding frequency (overall Strength ABC Program 2x/week, but LE strengthening more frequently to help with her c/o LE weakness). Also discussed beginning a walking routine this summer after school is out and pt wont be getting her walking in at work, and exercising in the pool. Pt was nervous about wearing a bathing suit reporting she hadnt found one that she felt comfortable in, so therapist suggested her getting a Silkie, or UV protected swim shirt and pt was very excited about that because she loves to exercise in the pool during the summer, also instructed her in how that would be beneficial to her keeping her lymphedema controlled.  Person(s) Educated Patient   Methods Explanation   Comprehension Verbalized understanding                [facility] Goals - 04/16/15 1623    CC Long Term Goal  #3   Title independent in strength ABC program   Status Achieved            Plan - 04/16/15 1705    Clinical Impression Statement Pt has done very well with this episode of care. She is independent with her HEP and compliant with Maintenance Phase of Treament. Pt is ready for discharge.    Pt will benefit from skilled therapeutic intervention in order to improve on the following deficits Increased edema;Decreased range of motion;Impaired UE functional use;Decreased knowledge of use of DME   Rehab Potential Excellent   PT Frequency 1x / week   PT Duration 3 weeks   PT Treatment/Interventions Manual techniques;Therapeutic exercise   PT Next Visit Plan Discharge  visit.   Consulted and Agree with Plan of Care Patient          G-Codes - 11-May-2015 0844    Functional Assessment Tool Used quick DASH   Functional Limitation Carrying, moving and handling objects   Carrying, Moving and Handling Objects Goal Status [phone]) At least 1 percent but less than 20 percent impaired, limited or restricted   Carrying, Moving and Handling Objects Discharge Status [phone]) At least 1 percent but less than 20 percent impaired, limited or restricted      Problem List Patient Active Problem List   Diagnosis Date Noted  . Hyperglycemia 03/21/2015  . Nocturnal leg cramps 03/21/2015  . Estrogen deficiency 03/21/2015  . Breast cancer, right breast 12/26/2014  . Encounter for Medicare annual wellness exam 02/28/2014  . Pre-employment examination 08/29/2013  . BREAST CANCER 10/21/2010  . Vitamin D deficiency 10/11/2008  . Hyperlipidemia 07/07/2007  . GERD 07/07/2007  . Osteopenia 07/07/2007  . Fatty liver 07/07/2007    Tatsuo Musial 05/11/15, 8:45 AM  [place], [place], [address] Phone: [phone]   Fax:  [phone]  PHYSICAL THERAPY DISCHARGE SUMMARY  Visits from Start of Care: 7  Current functional level related to goals / functional outcomes: All goals met.   Remaining deficits: None at this time.   Education / Equipment: Home exercise program, self-manual lymph drainage instructed and reviewed, compression garments Plan: Patient agrees to discharge.  Patient goals were met. Patient is being discharged due to meeting the stated rehab goals.  ?????    Serafina Royals, PT 05/11/2015 8:47 AM

## 2015-05-06 ENCOUNTER — Encounter: Payer: Self-pay | Admitting: Family Medicine

## 2015-05-12 ENCOUNTER — Ambulatory Visit (INDEPENDENT_AMBULATORY_CARE_PROVIDER_SITE_OTHER)
Admission: RE | Admit: 2015-05-12 | Discharge: 2015-05-12 | Disposition: A | Discharge status: Home / Self Care | Payer: Medicare Other | Source: Ambulatory Visit | Attending: Family Medicine | Admitting: Family Medicine

## 2015-05-12 DIAGNOSIS — E2839 Other primary ovarian failure: Secondary | ICD-10-CM | POA: Diagnosis not present

## 2015-05-12 LAB — HM DEXA SCAN

## 2015-05-22 DIAGNOSIS — H3531 Nonexudative age-related macular degeneration: Secondary | ICD-10-CM | POA: Diagnosis not present

## 2015-05-30 ENCOUNTER — Encounter (INDEPENDENT_AMBULATORY_CARE_PROVIDER_SITE_OTHER): Payer: Medicare Other | Admitting: Ophthalmology

## 2015-05-30 DIAGNOSIS — H43813 Vitreous degeneration, bilateral: Secondary | ICD-10-CM | POA: Diagnosis not present

## 2015-05-30 DIAGNOSIS — H3531 Nonexudative age-related macular degeneration: Secondary | ICD-10-CM | POA: Diagnosis not present

## 2015-06-02 ENCOUNTER — Encounter: Payer: Self-pay | Admitting: *Deleted

## 2015-06-02 ENCOUNTER — Encounter: Payer: Self-pay | Admitting: Family Medicine

## 2015-06-16 ENCOUNTER — Other Ambulatory Visit: Payer: Self-pay | Admitting: Family Medicine

## 2015-06-17 ENCOUNTER — Encounter: Payer: Self-pay | Admitting: Family Medicine

## 2015-07-08 ENCOUNTER — Ambulatory Visit (INDEPENDENT_AMBULATORY_CARE_PROVIDER_SITE_OTHER): Payer: Medicare Other | Admitting: Family Medicine

## 2015-07-08 VITALS — BP 132/74 | HR 91 | Temp 97.9°F | Resp 18 | Ht 62.5 in | Wt 146.6 lb

## 2015-07-08 DIAGNOSIS — N390 Urinary tract infection, site not specified: Secondary | ICD-10-CM

## 2015-07-08 DIAGNOSIS — R300 Dysuria: Secondary | ICD-10-CM

## 2015-07-08 DIAGNOSIS — C50911 Malignant neoplasm of unspecified site of right female breast: Secondary | ICD-10-CM | POA: Diagnosis not present

## 2015-07-08 LAB — POCT URINALYSIS DIPSTICK
Bilirubin, UA: NEGATIVE
Glucose, UA: NEGATIVE
Ketones, UA: NEGATIVE
Nitrite, UA: NEGATIVE
Spec Grav, UA: 1.01
Urobilinogen, UA: 0.2
pH, UA: 5.5

## 2015-07-08 LAB — POCT UA - MICROSCOPIC ONLY
Casts, Ur, LPF, POC: NEGATIVE
Crystals, Ur, HPF, POC: NEGATIVE
Mucus, UA: NEGATIVE
Renal tubular cells: POSITIVE
Yeast, UA: NEGATIVE

## 2015-07-08 MED ORDER — CEPHALEXIN 500 MG PO CAPS: 500.0000 mg | ORAL_CAPSULE | Freq: Three times a day (TID) | ORAL | Status: DC

## 2015-07-08 NOTE — Progress Notes (Signed)
Chief Complaint:  Chief Complaint  Patient presents with  . Urinary Tract Infection    started friday   . Dysuria  . Urinary Frequency    HPI: Brianna [name] is a 69 y.o. female who reports to [facility] today complaining of  4 day hisotry of urinary frequency and urgency and UTI sxs. Has had this before. She deneis feves, chill, nausea, abd pain or back pain.  No vaginal dc or  Blood in her urine  Past Medical History  Diagnosis Date  . GERD (gastroesophageal reflux disease)   . Hyperlipidemia   . Osteoporosis     fosamax started 9/08  . Vitamin D deficiency   . Shingles   . Allergic rhinitis   . Breast carcinoma 10/11    right- eventual double mastectomy in 2012   Past Surgical History  Procedure Laterality Date  . Partial hysterectomy  1980's    non cancer, ovaries intact  . Breast biopsy  10/11    invasive and in situ mam carcinoma  . Breast lumpectomy  11/11  . Mastectomy  1/12    double- Dr. Donne Hazel  . Mastectomy, radical Bilateral 11/2010  . Wrist fracture surgery Left     in the 1990's  . Cataract extraction      does not remember when   Social History   Social History  . Marital Status: Single    Spouse Name: N/A  . Number of Children: 1  . Years of Education: N/A   Occupational History  . retired Char[employer] fundraiser   Social History Main Topics  . Smoking status: Never Smoker   . Smokeless tobacco: Never Used  . Alcohol Use: 0.0 oz/week    0 Standard drinks or equivalent per week     Comment: wine rarely  . Drug Use: No  . Sexual Activity: Not Asked   Other Topics Concern  . None   Social History Narrative   Family History  Problem Relation Age of Onset  . Dementia Mother   . Cancer Father     esophageal  . Diabetes Brother   . Cancer Paternal Aunt     ovarian   Allergies  Allergen Reactions  . Pravachol     Myalgias  (As of visit on 05/17/2013, patient is taking Pravachol, but continues to have muscle pain)  . Zetia  [Ezetimibe] Other (See Comments)    Ineffective, elevated liver enzymes   Prior to Admission medications   Medication Sig Start Date End Date Taking? Authorizing Provider  Calcium Carbonate-Vitamin D (CALCIUM-VITAMIN D) 600-200 MG-UNIT CAPS Take 2 by mouth daily    Yes Historical Provider, MD  Cholecalciferol (VITAMIN D-3) 1000 UNITS CAPS Take 1 capsule by mouth daily.   Yes Historical Provider, MD  Coenzyme Q10 (CO Q-10) 200 MG CAPS Take by mouth 2 (two) times daily.     Yes Historical Provider, MD  Cranberry-Vitamin C-Vitamin E 4200-20-3 MG-MG-UNIT CAPS Take 2 tablets by mouth daily.   Yes Historical Provider, MD  fexofenadine (ALLEGRA) 180 MG tablet Take otc as directed   Yes Historical Provider, MD  Multiple Vitamin (MULTIVITAMIN) tablet Take 1 tablet by mouth daily.     Yes Historical Provider, MD  omeprazole (PRILOSEC) 20 MG capsule TAKE 1 CAPSULE (20 MG TOTAL) BY MOUTH DAILY. 03/21/15  Yes Godfrey, MD  pravastatin (PRAVACHOL) 80 MG tablet TAKE 1 TABLET (80 MG TOTAL) BY MOUTH DAILY. 03/21/15  Yes Abner Greenspan, MD  tamoxifen (  NOLVADEX) 20 MG tablet Take 1 tablet (20 mg total) by mouth daily. 12/26/14  Yes Chauncey Cruel, MD     ROS: The patient denies fevers, chills, night sweats, unintentional weight loss, chest pain, palpitations, wheezing, dyspnea on exertion, nausea, vomiting, abdominal pain,  hematuria, melena, numbness, weakness, or tingling.   All other systems have been reviewed and were otherwise negative with the exception of those mentioned in the HPI and as above.    PHYSICAL EXAM: Filed Vitals:   07/08/15 1051  BP: 132/74  Pulse: 91  Temp: 97.9 F (36.6 C)  Resp: 18   Body mass index is 26.37 kg/(m^2).   General: Alert, no acute distress HEENT:  Normocephalic, atraumatic, oropharynx patent. EOMI, PERRLA Cardiovascular:  Regular rate and rhythm, no rubs murmurs or gallops.   Respiratory: Clear to auscultation bilaterally.  No wheezes, rales, or rhonchi.  No  cyanosis, no use of accessory musculature Abdominal: No organomegaly, abdomen is soft and non-tender, positive bowel sounds. No masses. Skin: No rashes. Neurologic: Facial musculature symmetric. Psychiatric: Patient acts appropriately throughout our interaction. Musculoskeletal: Gait intact. No edema, tenderness No CVA tenderness   LABS: Results for orders placed or performed in visit on 07/08/15  Urine culture  Result Value Ref Range   Culture ESCHERICHIA COLI    Colony Count >=100,000 COLONIES/ML    Organism ID, Bacteria ESCHERICHIA COLI       Susceptibility   Escherichia coli -  (no method available)    AMPICILLIN <=2 Sensitive     AMOX/CLAVULANIC <=2 Sensitive     AMPICILLIN/SULBACTAM <=2 Sensitive     PIP/TAZO <=4 Sensitive     IMIPENEM <=0.25 Sensitive     CEFTRIAXONE <=1 Sensitive     CEFTAZIDIME <=1 Sensitive     CEFEPIME <=1 Sensitive     GENTAMICIN <=1 Sensitive     TOBRAMYCIN <=1 Sensitive     CIPROFLOXACIN <=0.25 Sensitive     LEVOFLOXACIN <=0.12 Sensitive     NITROFURANTOIN 32 Sensitive     TRIMETH/SULFA* <=20 Sensitive      * ORAL therapy:A cefazolin MIC of <32 predicts susceptibility to the oral agents cefaclor,cefdinir,cefpodoxime,cefprozil,cefuroxime,cephalexin,and loracarbef when used for therapy of uncomplicated UTIs due to E.coli,K.pneumomiae,and P.mirabilis. PARENTERAL therapy: A cefazolinMIC of >8 indicates resistance to parenteralcefazolin. An alternate test method must beperformed to confirm susceptibility to parenteralcefazolin.  POCT urinalysis dipstick  Result Value Ref Range   Color, UA yellow    Clarity, UA cloudy    Glucose, UA neg    Bilirubin, UA neg    Ketones, UA neg    Spec Grav, UA 1.010    Blood, UA moderate    pH, UA 5.5    Protein, UA trace    Urobilinogen, UA 0.2    Nitrite, UA neg    Leukocytes, UA large (3+) (A) Negative  POCT UA - Microscopic Only  Result Value Ref Range   WBC, Ur, HPF, POC tntc    RBC, urine, microscopic 0-1     Bacteria, U Microscopic 1+    Mucus, UA neg    Epithelial cells, urine per micros 0-1    Crystals, Ur, HPF, POC neg    Casts, Ur, LPF, POC neg    Yeast, UA neg    Renal tubular cells positive      EKG/XRAY:   Primary read interpreted by Dr. Marin Comment at [facility].   ASSESSMENT/PLAN: Encounter Diagnoses  Name Primary?  . Dysuria   . Acute UTI Yes   Rx Keflex Urine  cx Spoke with pateitn a bout urine cx results, if still have minimal persistent sxs after course then may extend to additional 3 days Otherwise increase fluids and take abx  Gross sideeffects, risk and benefits, and alternatives of medications d/w patient. Patient is aware that all medications have potential sideeffects and we are unable to predict every sideeffect or drug-drug interaction that may occur.  Thao Le DO  07/12/2015 6:15 AM

## 2015-07-08 NOTE — Patient Instructions (Signed)

## 2015-07-10 LAB — URINE CULTURE: Colony Count: >=100000

## 2015-07-18 ENCOUNTER — Other Ambulatory Visit: Payer: Self-pay | Admitting: Oncology

## 2015-09-15 ENCOUNTER — Other Ambulatory Visit (INDEPENDENT_AMBULATORY_CARE_PROVIDER_SITE_OTHER): Payer: Medicare Other

## 2015-09-15 DIAGNOSIS — R739 Hyperglycemia, unspecified: Secondary | ICD-10-CM

## 2015-09-15 LAB — HEMOGLOBIN A1C: Hgb A1c MFr Bld: 5.4 % (ref 4.6–6.5)

## 2015-09-22 ENCOUNTER — Ambulatory Visit: Payer: Medicare Other | Admitting: Family Medicine

## 2015-09-23 ENCOUNTER — Ambulatory Visit: Payer: Medicare Other | Admitting: Family Medicine

## 2015-10-06 ENCOUNTER — Ambulatory Visit (INDEPENDENT_AMBULATORY_CARE_PROVIDER_SITE_OTHER): Payer: Medicare Other | Admitting: Family Medicine

## 2015-10-06 ENCOUNTER — Encounter: Payer: Self-pay | Admitting: Family Medicine

## 2015-10-06 VITALS — BP 130/80 | HR 86 | Temp 98.2°F | Wt 144.2 lb

## 2015-10-06 DIAGNOSIS — Z23 Encounter for immunization: Secondary | ICD-10-CM | POA: Diagnosis not present

## 2015-10-06 DIAGNOSIS — R739 Hyperglycemia, unspecified: Secondary | ICD-10-CM | POA: Diagnosis not present

## 2015-10-06 NOTE — Progress Notes (Signed)
Subjective:    Patient ID: Brianna [name], female    DOB: May 07, 1946, 69 y.o.   MRN: [id number]  HPI Here for f/u of hyperglycemia  Doing very well /nothing to c/o about  Wt is down 2 lb with bmi of 25    Last visit fasting glucose was 130 She has been working at diet to cut out sugar  She does have a craving now and then for candy-not often at all  None in the past week   Is still walking regularly for exercise - proud of this  5 days per week  3-4 miles per day while she is at work (does not sit down)   Really enjoys her work     Lab Results  Component Value Date   HGBA1C 5.4 09/15/2015    BP Readings from Last 3 Encounters:  10/06/15 136/62  07/08/15 132/74  03/21/15 140/78    Re check 130/80   Patient Active Problem List   Diagnosis Date Noted  . Pre-employment examination 08/29/2013    Priority: Medium  . Hyperglycemia 03/21/2015  . Nocturnal leg cramps 03/21/2015  . Estrogen deficiency 03/21/2015  . Breast cancer, right breast ([place]) 12/26/2014  . Encounter for Medicare annual wellness exam 02/28/2014  . BREAST CANCER 10/21/2010  . Vitamin D deficiency 10/11/2008  . Hyperlipidemia 07/07/2007  . GERD 07/07/2007  . Osteopenia 07/07/2007  . Fatty liver 07/07/2007   Past Medical History  Diagnosis Date  . GERD (gastroesophageal reflux disease)   . Hyperlipidemia   . Osteoporosis     fosamax started 9/08  . Vitamin D deficiency   . Shingles   . Allergic rhinitis   . Breast carcinoma ([place]) 10/11    right- eventual double mastectomy in 2012   Past Surgical History  Procedure Laterality Date  . Partial hysterectomy  1980's    non cancer, ovaries intact  . Breast biopsy  10/11    invasive and in situ mam carcinoma  . Breast lumpectomy  11/11  . Mastectomy  1/12    double- Dr. Donne Hazel  . Mastectomy, radical Bilateral 11/2010  . Wrist fracture surgery Left     in the 1990's  . Cataract extraction      does not remember when   Social  History  Substance Use Topics  . Smoking status: Never Smoker   . Smokeless tobacco: Never Used  . Alcohol Use: 0.0 oz/week    0 Standard drinks or equivalent per week     Comment: wine rarely   Family History  Problem Relation Age of Onset  . Dementia Mother   . Cancer Father     esophageal  . Diabetes Brother   . Cancer Paternal Aunt     ovarian   Allergies  Allergen Reactions  . Pravachol     Myalgias  (As of visit on 05/17/2013, patient is taking Pravachol, but continues to have muscle pain)  . Zetia [Ezetimibe] Other (See Comments)    Ineffective, elevated liver enzymes   Current Outpatient Prescriptions on File Prior to Visit  Medication Sig Dispense Refill  . Calcium Carbonate-Vitamin D (CALCIUM-VITAMIN D) 600-200 MG-UNIT CAPS Take 2 by mouth daily     . Cholecalciferol (VITAMIN D-3) 1000 UNITS CAPS Take 1 capsule by mouth daily.    . Coenzyme Q10 (CO Q-10) 200 MG CAPS Take by mouth 2 (two) times daily.      . fexofenadine (ALLEGRA) 180 MG tablet Take otc as directed    .  Multiple Vitamin (MULTIVITAMIN) tablet Take 1 tablet by mouth daily.      Marland Kitchen omeprazole (PRILOSEC) 20 MG capsule TAKE 1 CAPSULE (20 MG TOTAL) BY MOUTH DAILY. 90 capsule 3  . pravastatin (PRAVACHOL) 80 MG tablet TAKE 1 TABLET (80 MG TOTAL) BY MOUTH DAILY. 90 tablet 3  . tamoxifen (NOLVADEX) 20 MG tablet TAKE 1 TABLET (20 MG TOTAL) BY MOUTH DAILY. 90 tablet 1   No current facility-administered medications on file prior to visit.    Review of Systems Review of Systems  Constitutional: Negative for fever, appetite change, fatigue and unexpected weight change.  Eyes: Negative for pain and visual disturbance.  Respiratory: Negative for cough and shortness of breath.   Cardiovascular: Negative for cp or palpitations    Gastrointestinal: Negative for nausea, diarrhea and constipation.  Genitourinary: Negative for urgency and frequency.  Skin: Negative for pallor or rash   Neurological: Negative for  weakness, light-headedness, numbness and headaches.  Hematological: Negative for adenopathy. Does not bruise/bleed easily.  Psychiatric/Behavioral: Negative for dysphoric mood. The patient is not nervous/anxious.         Objective:   Physical Exam  Constitutional: She appears well-developed and well-nourished. No distress.  Well appearing   HENT:  Head: Normocephalic and atraumatic.  Mouth/Throat: Oropharynx is clear and moist.  Eyes: Conjunctivae and EOM are normal. Pupils are equal, round, and reactive to light.  Neck: Normal range of motion. Neck supple. No JVD present. Carotid bruit is not present. No thyromegaly present.  Cardiovascular: Normal rate, regular rhythm, normal heart sounds and intact distal pulses.  Exam reveals no gallop.   Pulmonary/Chest: Effort normal and breath sounds normal. No respiratory distress. She has no wheezes. She has no rales.  No crackles  Abdominal: Soft. Bowel sounds are normal. She exhibits no distension, no abdominal bruit and no mass. There is no tenderness.  Musculoskeletal: She exhibits no edema.  Lymphadenopathy:    She has no cervical adenopathy.  Neurological: She is alert. She has normal reflexes.  Skin: Skin is warm and dry. No rash noted.  Psychiatric: She has a normal mood and affect.          Assessment & Plan:   Problem List Items Addressed This Visit      Other   Hyperglycemia - Primary    At last visit fasting glucose 130 Lab Results  Component Value Date   HGBA1C 5.4 09/15/2015   This is better than expected - enc further exercise and low glycemic diet to prevent DM Will continue to follow        Other Visit Diagnoses    Need for influenza vaccination        Relevant Orders    Flu Vaccine QUAD 36+ mos PF IM (Fluarix & Fluzone Quad PF) (Completed)

## 2015-10-06 NOTE — Assessment & Plan Note (Signed)
At last visit fasting glucose 130 Lab Results  Component Value Date   HGBA1C 5.4 09/15/2015   This is better than expected - enc further exercise and low glycemic diet to prevent DM Will continue to follow

## 2015-10-06 NOTE — Patient Instructions (Signed)
Blood pressure is 130/80 Flu shot today  Your A1C is 5.4- quite good -keep watching sugar in diet as well as you can  Keep walking!   Follow up in 6 months for annual exam with lab prior

## 2015-10-06 NOTE — Progress Notes (Signed)
Pre visit review using our clinic review tool, if applicable. No additional management support is needed unless otherwise documented below in the visit note. 

## 2015-10-12 ENCOUNTER — Ambulatory Visit (INDEPENDENT_AMBULATORY_CARE_PROVIDER_SITE_OTHER): Payer: Medicare Other | Admitting: Physician Assistant

## 2015-10-12 VITALS — BP 138/72 | HR 86 | Temp 98.5°F | Resp 16 | Ht 61.0 in | Wt 143.0 lb

## 2015-10-12 DIAGNOSIS — R059 Cough, unspecified: Secondary | ICD-10-CM

## 2015-10-12 DIAGNOSIS — R05 Cough: Secondary | ICD-10-CM | POA: Diagnosis not present

## 2015-10-12 DIAGNOSIS — J0100 Acute maxillary sinusitis, unspecified: Secondary | ICD-10-CM | POA: Diagnosis not present

## 2015-10-12 MED ORDER — AMOXICILLIN-POT CLAVULANATE 875-125 MG PO TABS: 1.0000 | ORAL_TABLET | Freq: Two times a day (BID) | ORAL | Status: AC

## 2015-10-12 MED ORDER — BENZONATATE 100 MG PO CAPS: 100.0000 mg | ORAL_CAPSULE | Freq: Three times a day (TID) | ORAL | Status: DC | PRN

## 2015-10-12 NOTE — Progress Notes (Signed)
Patient ID: Brianna [name], female    DOB: [birth year], 69 y.o.   MRN: [id number]  PCP: Loura Pardon, MD  Subjective:   Chief Complaint  Patient presents with  . Ear Pain    both, x 5 days   . Cough  . Back Pain    HPI Presents for evaluation of bilateral ear pain L>R, cough and congestion x 5 days. She also notes that chronic RIGHT low back pain that she'd had for years is worse.  No headache or facial pain.  No fever, chills. No nausea or vomiting. Back pain is worse if she spends too much time without activity, but now it hurts even when she's just lying in bed. No radicular pain. No saddle anesthesia, LE weakness.    Review of Systems As above.    Patient Active Problem List   Diagnosis Date Noted  . Hyperglycemia 03/21/2015  . Nocturnal leg cramps 03/21/2015  . Estrogen deficiency 03/21/2015  . Breast cancer, right breast ([place]) 12/26/2014  . Encounter for Medicare annual wellness exam 02/28/2014  . Pre-employment examination 08/29/2013  . BREAST CANCER 10/21/2010  . Vitamin D deficiency 10/11/2008  . Hyperlipidemia 07/07/2007  . GERD 07/07/2007  . Osteopenia 07/07/2007  . Fatty liver 07/07/2007     Prior to Admission medications   Medication Sig Start Date End Date Taking? Authorizing Provider  Calcium Carbonate-Vitamin D (CALCIUM-VITAMIN D) 600-200 MG-UNIT CAPS Take 2 by mouth daily    Yes Historical Provider, MD  Coenzyme Q10 (CO Q-10) 200 MG CAPS Take by mouth 2 (two) times daily.     Yes Historical Provider, MD  fexofenadine (ALLEGRA) 180 MG tablet Take otc as directed   Yes Historical Provider, MD  Multiple Vitamin (MULTIVITAMIN) tablet Take 1 tablet by mouth daily.     Yes Historical Provider, MD  omeprazole (PRILOSEC) 20 MG capsule TAKE 1 CAPSULE (20 MG TOTAL) BY MOUTH DAILY. 03/21/15  Yes Clare, MD  pravastatin (PRAVACHOL) 80 MG tablet TAKE 1 TABLET (80 MG TOTAL) BY MOUTH DAILY. 03/21/15  Yes Abner Greenspan, MD  tamoxifen (NOLVADEX) 20 MG  tablet TAKE 1 TABLET (20 MG TOTAL) BY MOUTH DAILY. 07/18/15  Yes Chauncey Cruel, MD     Allergies  Allergen Reactions  . Pravachol     Myalgias  (As of visit on 05/17/2013, patient is taking Pravachol, but continues to have muscle pain)  . Zetia [Ezetimibe] Other (See Comments)    Ineffective, elevated liver enzymes       Objective:  Physical Exam  Constitutional: She is oriented to person, place, and time. Vital signs are normal. She appears well-developed and well-nourished. She is active and cooperative. She appears ill (obviously tired. Cough is "wracking" but non-productive). No distress.  BP 138/72 mmHg  Pulse 86  Temp(Src) 98.5 F (36.9 C) (Oral)  Resp 16  Ht 5\' 1"  (1.549 m)  Wt 143 lb (64.864 kg)  BMI 27.03 kg/m2  SpO2 96%   HENT:  Head: Normocephalic and atraumatic.  Right Ear: Hearing, tympanic membrane, external ear and ear canal normal.  Left Ear: Hearing, tympanic membrane, external ear and ear canal normal.  Nose: Mucosal edema present.  No foreign bodies. Right sinus exhibits no maxillary sinus tenderness and no frontal sinus tenderness. Left sinus exhibits maxillary sinus tenderness. Left sinus exhibits no frontal sinus tenderness.  Mouth/Throat: Uvula is midline, oropharynx is clear and moist and mucous membranes are normal. No uvula swelling. No oropharyngeal exudate.  Eyes: Conjunctivae and  EOM are normal. Pupils are equal, round, and reactive to light. Right eye exhibits no discharge. Left eye exhibits no discharge. No scleral icterus.  Neck: Trachea normal, normal range of motion and full passive range of motion without pain. Neck supple. No thyroid mass and no thyromegaly present.  Cardiovascular: Normal rate, regular rhythm and normal heart sounds.   Pulmonary/Chest: Effort normal and breath sounds normal.  Lymphadenopathy:       Head (right side): No submandibular, no tonsillar, no preauricular, no posterior auricular and no occipital adenopathy present.         Head (left side): No submandibular, no tonsillar, no preauricular and no occipital adenopathy present.    She has no cervical adenopathy.       Right: No supraclavicular adenopathy present.       Left: No supraclavicular adenopathy present.  Neurological: She is alert and oriented to person, place, and time. She has normal strength. No cranial nerve deficit or sensory deficit.  Skin: Skin is warm, dry and intact. No rash noted.  Psychiatric: She has a normal mood and affect. Her speech is normal and behavior is normal.           Assessment & Plan:   1. Subacute maxillary sinusitis Rest. Hydration. Anticipatory guidance provided. - amoxicillin-clavulanate (AUGMENTIN) 875-125 MG tablet; Take 1 tablet by mouth 2 (two) times daily.  Dispense: 20 tablet; Refill: 0  2. Cough Due to PND due to #1 - benzonatate (TESSALON) 100 MG capsule; Take 1-2 capsules (100-200 mg total) by mouth 3 (three) times daily as needed for cough.  Dispense: 40 capsule; Refill: 0   Fara Chute, PA-C Physician Assistant-Certified [facility]

## 2015-10-12 NOTE — Patient Instructions (Signed)
Get plenty of rest and drink at least 64 ounces of water daily. 

## 2015-12-29 ENCOUNTER — Other Ambulatory Visit: Payer: Self-pay

## 2015-12-29 DIAGNOSIS — C50911 Malignant neoplasm of unspecified site of right female breast: Secondary | ICD-10-CM

## 2015-12-30 ENCOUNTER — Ambulatory Visit (HOSPITAL_BASED_OUTPATIENT_CLINIC_OR_DEPARTMENT_OTHER): Payer: Medicare Other | Admitting: Oncology

## 2015-12-30 ENCOUNTER — Other Ambulatory Visit (HOSPITAL_BASED_OUTPATIENT_CLINIC_OR_DEPARTMENT_OTHER): Payer: Medicare Other

## 2015-12-30 VITALS — BP 137/57 | HR 90 | Temp 97.9°F | Resp 18 | Ht 61.0 in | Wt 144.1 lb

## 2015-12-30 DIAGNOSIS — C50911 Malignant neoplasm of unspecified site of right female breast: Secondary | ICD-10-CM

## 2015-12-30 DIAGNOSIS — Z853 Personal history of malignant neoplasm of breast: Secondary | ICD-10-CM

## 2015-12-30 LAB — CBC WITH DIFFERENTIAL/PLATELET
BASO%: 0.5 % (ref 0.0–2.0)
BASOS ABS: 0 10*3/uL (ref 0.0–0.1)
EOS%: 1.2 % (ref 0.0–7.0)
Eosinophils Absolute: 0.1 10*3/uL (ref 0.0–0.5)
HCT: 38.8 % (ref 34.8–46.6)
HGB: 12.8 g/dL (ref 11.6–15.9)
LYMPH%: 24.9 % (ref 14.0–49.7)
MCH: 28.3 pg (ref 25.1–34.0)
MCHC: 33 g/dL (ref 31.5–36.0)
MCV: 85.6 fL (ref 79.5–101.0)
MONO#: 0.7 10*3/uL (ref 0.1–0.9)
MONO%: 7.8 % (ref 0.0–14.0)
NEUT#: 6 10*3/uL (ref 1.5–6.5)
NEUT%: 65.6 % (ref 38.4–76.8)
Platelets: 308 10*3/uL (ref 145–400)
RBC: 4.53 10*6/uL (ref 3.70–5.45)
RDW: 12.6 % (ref 11.2–14.5)
WBC: 9.1 10*3/uL (ref 3.9–10.3)
lymph#: 2.3 10*3/uL (ref 0.9–3.3)

## 2015-12-30 LAB — COMPREHENSIVE METABOLIC PANEL
ALT: 22 U/L (ref 0–55)
AST: 18 U/L (ref 5–34)
Albumin: 3.6 g/dL (ref 3.5–5.0)
Alkaline Phosphatase: 52 U/L (ref 40–150)
Anion Gap: 10 mEq/L (ref 3–11)
BUN: 14.7 mg/dL (ref 7.0–26.0)
CHLORIDE: 110 meq/L — AB (ref 98–109)
CO2: 23 mEq/L (ref 22–29)
Calcium: 9.1 mg/dL (ref 8.4–10.4)
Creatinine: 0.9 mg/dL (ref 0.6–1.1)
EGFR: 66 mL/min/{1.73_m2} — AB (ref >90)
Glucose: 107 mg/dl (ref 70–140)
POTASSIUM: 4.1 meq/L (ref 3.5–5.1)
SODIUM: 144 meq/L (ref 136–145)
Total Bilirubin: 0.44 mg/dL (ref 0.20–1.20)
Total Protein: 6.3 g/dL — ABNORMAL LOW (ref 6.4–8.3)

## 2015-12-30 NOTE — Progress Notes (Signed)
ID: Brianna [name]   DOB: 1946-07-29  MR#: [id number]  NOM#:[id number]   PCP: Loura Pardon, MD GYN: SU:  Rolm Bookbinder, MD Other:  Earlie Raveling MD  CHIEF COMPLAINT: Estrogen receptor positive breast cancer  CURRENT TREATMENT: completing 5 years of tamoxifen   HISTORY OF PRESENT ILLNESS: From the original intake note:  Brianna [name] had routine screening mammography September 30th, 2011 suggesting an abnormality on the right side.  Additional views showed an area of architectural distortion in the upper outer quadrant with associated microcalcifications.  Ultrasound showed an 8 mm mass in this area and she underwent ultrasound-guided biopsy of the right breast mass October 18th. This showed invasive lobular breast cancer associated with a radial scar.  The tumor was Her-2 positive at 97%, PR positive at 12%, with a borderline proliferation fraction at 17% and no amplification of Her-2 by CISH with a ratio of 1.16.    The patient was then referred to Dr. Donne Hazel and bilateral breast MRIs were obtained October 25th.  This showed the spiculated enhancing mass in the upper outer quadrant of the right breast which had been biopsied.  Posterior and lateral to that mass, however, there was an area of linear nodular enhancement.  Then in the left breast, in the lower outer quadrant, there was another enhancing mass measuring 1.1 cm.    Accordingly on November 2nd, the patient had an MRI-guided biopsy of the mass in the left breast. This proved to be a complex sclerosing lesion without atypia  ([id number]).  With this information, the patient proceeded to removal of the left-sided sclerosing lesion as well as right lumpectomy and sentinel lymph node sampling on October 07, 2010.  The results of this procedure ([id number]) showed on the left side a radial scar with focal atypia but no evidence of carcinoma and negative margins.  On the right side, there was a 2.3 cm invasive lobular carcinoma (E-cadherin  negative), Grade 2, extending to the inferior margin.  There was no evidence of angiolymphatic invasion.  The two sentinel lymph nodes on the right were negative.  Her-2 was repeated on this invasive sample and again, no amplification was noted.  Her subsequent history is as detailed below.  INTERVAL HISTORY: Brianna [name] returns today for followup of her estrogen receptor positive breast cancer. She continues on tamoxifen, which she generally tolerates well. She does still have hot flashes problems. She does not have issues regarding vaginal discharge or wetness. She obtains a drug at a good price.  REVIEW OF SYSTEMS: Brianna [name] is generally "fine" she enjoyed the holidays and is very interested in watching her sassy 38 year old granddaughter grow up. She had some hemorrhoidal bleeding which has resolved. She does bruise easily she thinks. She has arthritis pains here in there which are not more intense or persistent than before. A detailed review of systems today was otherwise noncontributory  PAST MEDICAL HISTORY: Past Medical History  Diagnosis Date  . GERD (gastroesophageal reflux disease)   . Hyperlipidemia   . Osteoporosis     fosamax started 9/08  . Vitamin D deficiency   . Shingles   . Allergic rhinitis   . Breast carcinoma ([place]) 10/11    right- eventual double mastectomy in 2012  hypercholesterolemia, history of remote migraines, history of GERD, history of bilateral cataract surgery and history of simple hysterectomy.   PAST SURGICAL HISTORY: Past Surgical History  Procedure Laterality Date  . Partial hysterectomy  1980's    non cancer, ovaries intact  . Breast biopsy  10/11    invasive and in situ mam carcinoma  . Breast lumpectomy  11/11  . Mastectomy  1/12    double- Dr. Donne Hazel  . Mastectomy, radical Bilateral 11/2010  . Wrist fracture surgery Left     in the 1990's  . Cataract extraction      does not remember when    FAMILY HISTORY Family History  Problem Relation Age  of Onset  . Dementia Mother   . Cancer Father     esophageal  . Diabetes Brother   . Cancer Paternal Aunt     ovarian  The patient's father died at the age of 55 from lung cancer in the setting of COPD.  The patient's mother died with Alzheimer's disease at 9.  The patient had four brothers, no sisters.  There is no breast cancer in the family but the patient's father had 75 sisters, one of whom had ovarian cancer in her seventies.    GYNECOLOGIC HISTORY: She is GX P1, first pregnancy to term at 60.  She underwent hysterectomy in 1989. She took hormones starting at age 31 and only took them for 2 years.  SOCIAL HISTORY: She used to work for [employer] 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  She is divorced and lives by herself.  Her daughter [name] lives not far from her, also in [place].  [name] works in [place] entry for the [employer]. The patient has one granddaughter, 65 years old as of 2017.. The patient is a [place].     ADVANCED DIRECTIVES: not in place  HEALTH MAINTENANCE: Social History  Substance Use Topics  . Smoking status: Never Smoker   . Smokeless tobacco: Never Used  . Alcohol Use: 0.0 oz/week    0 Standard drinks or equivalent per week     Comment: wine rarely     Colonoscopy:  UTD/ [facility] January 2016  PAP:  Bone density: Jan 2013 at [facility], osteopenia  Lipid panel:  UTD, Dr. [name]   Allergies  Allergen Reactions  . Pravachol     Myalgias  (As of visit on 05/17/2013, patient is taking Pravachol, but continues to have muscle pain)  . Zetia [Ezetimibe] Other (See Comments)    Ineffective, elevated liver enzymes    Current Outpatient Prescriptions  Medication Sig Dispense Refill  . benzonatate (TESSALON) 100 MG capsule Take 1-2 capsules (100-200 mg total) by mouth 3 (three) times daily as needed for cough. 40 capsule 0  . Calcium Carbonate-Vitamin D  (CALCIUM-VITAMIN D) 600-200 MG-UNIT CAPS Take 2 by mouth daily     . Coenzyme Q10 (CO Q-10) 200 MG CAPS Take by mouth 2 (two) times daily.      . fexofenadine (ALLEGRA) 180 MG tablet Take otc as directed    . Multiple Vitamin (MULTIVITAMIN) tablet Take 1 tablet by mouth daily.      Marland Kitchen omeprazole (PRILOSEC) 20 MG capsule TAKE 1 CAPSULE (20 MG TOTAL) BY MOUTH DAILY. 90 capsule 3  . pravastatin (PRAVACHOL) 80 MG tablet TAKE 1 TABLET (80 MG TOTAL) BY MOUTH DAILY. 90 tablet 3  . tamoxifen (NOLVADEX) 20 MG tablet TAKE 1 TABLET (20 MG TOTAL) BY MOUTH DAILY. 90 tablet 1   No current facility-administered medications for this visit.    OBJECTIVE: Middle-aged white woman who appears well Filed Vitals:   12/30/15 1418  BP: 137/57  Pulse: 90  Temp: 97.9 F (  36.6 C)  Resp: 18     Body mass index is 27.24 kg/(m^2).    ECOG FS: 0 Filed Weights   12/30/15 1418  Weight: 144 lb 1.6 oz (65.363 kg)   Sclerae unicteric, pupils round and equal Oropharynx clear and moist-- no thrush or other lesions No cervical or supraclavicular adenopathy Lungs no rales or rhonchi Heart regular rate and rhythm Abd soft, nontender, positive bowel sounds MSK no focal spinal tenderness, no upper extremity lymphedema Neuro: nonfocal, well oriented, appropriate affect Breasts: Status post bilateral mastectomies. There is no evidence of chest wall recurrence. Both axillae are benign.  LAB RESULTS: Lab Results  Component Value Date   WBC 9.1 12/30/2015   NEUTROABS 6.0 12/30/2015   HGB 12.8 12/30/2015   HCT 38.8 12/30/2015   MCV 85.6 12/30/2015   PLT 308 12/30/2015      Chemistry      Component Value Date/Time   NA 144 12/30/2015 1355   NA 140 03/14/2015 0752   K 4.1 12/30/2015 1355   K 4.3 03/14/2015 0752   CL 106 03/14/2015 0752   CO2 23 12/30/2015 1355   CO2 28 03/14/2015 0752   BUN 14.7 12/30/2015 1355   BUN 16 03/14/2015 0752   CREATININE 0.9 12/30/2015 1355   CREATININE 0.95 03/14/2015 0752       Component Value Date/Time   CALCIUM 9.1 12/30/2015 1355   CALCIUM 9.0 03/14/2015 0752   ALKPHOS 52 12/30/2015 1355   ALKPHOS 41 03/14/2015 0752   AST 18 12/30/2015 1355   AST 20 03/14/2015 0752   ALT 22 12/30/2015 1355   ALT 24 03/14/2015 0752   BILITOT 0.44 12/30/2015 1355   BILITOT 0.6 03/14/2015 0752       STUDIES:  Date of study: 05/12/2015 Exam: DUAL X-RAY ABSORPTIOMETRY (DXA) FOR BONE MINERAL DENSITY (BMD) Instrument: [employer] Requesting Provider: PCP Indication: follow up for low BMD Comparison: 11/28/2011 Clinical data: Pt is a postmenopausal 70 y.o. female with previous h/o wrist fracture. On calcium, vitamin D, Fosamax, Tamoxifen.  Results:  Lumbar spine (L1-L4) Femoral neck (FN)  T-score + 0.8 RFN: - 2.2 LFN: - 2.1  Change in BMD from previous DXA test (%) - 4.5%* + 0.7%  (*) statistically significant  Assessment: the BMD is low according to the [facility] classification for osteoporosis (see below). Fracture risk: moderate Comments: the technical quality of the study is good. Evaluation for secondary causes should be considered if clinically indicated.  Recommend optimizing calcium (1200 mg/day) and vitamin D (800 IU/day) intake.  Followup: Repeat BMD is appropriate after 1-2 years     ASSESSMENT: 70 y.o. [place] woman   (1)  status post right lumpectomy and sentinel lymph node dissection November 2011 with a positive margin.  These were not cleared with further attempts at margin clearance in December. Left breast biopsy showed radial scar.  (2)  The patient subsequently underwent definitive bilateral mastectomies on 12/02/2010. There was no malignancy on the left side. On the right, there was a T2 N0, stage IIA invasive lobular carcinoma, grade 2, ER positive 97%, PR positive 12%, HER-2/neu negative with MIB-1 of 17%.    (3)  She did not require post mastectomy radiation  (4)  She began tamoxifen February 2012, completed planned 5  years February 2017     PLAN:  Maayan is now a little more than 5 years out from her definitive surgery with no evidence of disease recurrence. This is very favorable.  I reviewed her history  in detail. She understands by taking tamoxifen for 5 years she reduce the risk of this cancer coming back by nearly one half.  If she took tamoxifen another 5 years, since she does not have any breast tissue left, there would be no benefit in preventing local recurrence and no benefit in terms of preventing a contralateral or ipsilateral new breast cancer. Accordingly the risk reduction would be limited to outside the breast recurrence and that would be approximately 1% in this case.  Given those odds even though she is tolerating tamoxifen well she is not interested in continuing it. She will complete the tablets she has already purchased and then not refill the prescription further.  We discussed continuing follow-up here but she is comfortable "getting out of the cancer business". As far as breast cancer screening is concerned all she will need is a yearly physician chest wall exam.  I will be glad to see Lanell at any point in the future as the need arises, but as of now were making no further routine appointments for her here.   MAGRINAT,GUSTAV C    12/30/2015

## 2016-03-14 ENCOUNTER — Telehealth: Payer: Self-pay | Admitting: Family Medicine

## 2016-03-14 DIAGNOSIS — E785 Hyperlipidemia, unspecified: Secondary | ICD-10-CM

## 2016-03-14 DIAGNOSIS — R739 Hyperglycemia, unspecified: Secondary | ICD-10-CM

## 2016-03-14 DIAGNOSIS — E559 Vitamin D deficiency, unspecified: Secondary | ICD-10-CM

## 2016-03-14 NOTE — Telephone Encounter (Signed)
-----   Message from Marchia Bond sent at 03/09/2016 11:06 AM EDT ----- Regarding: Cpx labs Tues 4/25, need orders. Thanks! :-) Please order  future cpx labs for pt's upcoming lab appt. Thanks Aniceto Boss

## 2016-03-16 ENCOUNTER — Other Ambulatory Visit (INDEPENDENT_AMBULATORY_CARE_PROVIDER_SITE_OTHER): Payer: Medicare Other

## 2016-03-16 DIAGNOSIS — E559 Vitamin D deficiency, unspecified: Secondary | ICD-10-CM

## 2016-03-16 DIAGNOSIS — E785 Hyperlipidemia, unspecified: Secondary | ICD-10-CM

## 2016-03-16 DIAGNOSIS — R739 Hyperglycemia, unspecified: Secondary | ICD-10-CM

## 2016-03-16 LAB — COMPREHENSIVE METABOLIC PANEL
ALBUMIN: 3.8 g/dL (ref 3.5–5.2)
ALK PHOS: 45 U/L (ref 39–117)
ALT: 28 U/L (ref 0–35)
AST: 22 U/L (ref 0–37)
BILIRUBIN TOTAL: 0.6 mg/dL (ref 0.2–1.2)
BUN: 14 mg/dL (ref 6–23)
CALCIUM: 9.2 mg/dL (ref 8.4–10.5)
CO2: 28 mEq/L (ref 19–32)
CREATININE: 0.89 mg/dL (ref 0.40–1.20)
Chloride: 107 mEq/L (ref 96–112)
GFR: 66.3 mL/min (ref >60.00)
Glucose, Bld: 122 mg/dL — ABNORMAL HIGH (ref 70–99)
Potassium: 4.5 mEq/L (ref 3.5–5.1)
Sodium: 142 mEq/L (ref 135–145)
TOTAL PROTEIN: 6.2 g/dL (ref 6.0–8.3)

## 2016-03-16 LAB — LIPID PANEL
CHOLESTEROL: 204 mg/dL — AB (ref 0–200)
HDL: 47.3 mg/dL (ref >39.00)
LDL Cholesterol: 119 mg/dL — ABNORMAL HIGH (ref 0–99)
NONHDL: 156.5
Total CHOL/HDL Ratio: 4
Triglycerides: 189 mg/dL — ABNORMAL HIGH (ref 0.0–149.0)
VLDL: 37.8 mg/dL (ref 0.0–40.0)

## 2016-03-16 LAB — VITAMIN D 25 HYDROXY (VIT D DEFICIENCY, FRACTURES): VITD: 23.22 ng/mL — ABNORMAL LOW (ref 30.00–100.00)

## 2016-03-16 LAB — TSH: TSH: 3.43 u[IU]/mL (ref 0.35–4.50)

## 2016-03-16 LAB — HEMOGLOBIN A1C: HEMOGLOBIN A1C: 5.7 % (ref 4.6–6.5)

## 2016-03-18 ENCOUNTER — Telehealth: Payer: Self-pay | Admitting: Family Medicine

## 2016-03-18 NOTE — Telephone Encounter (Signed)
LM for pt to sch AWV with Lesia on 5/2 at 2:30 (instead of already scheduled CPE with Dr. [name]), and come back for CPE with Dr. [name] on 5/16 at 3:45, mn

## 2016-03-23 ENCOUNTER — Ambulatory Visit (INDEPENDENT_AMBULATORY_CARE_PROVIDER_SITE_OTHER): Payer: Medicare Other

## 2016-03-23 ENCOUNTER — Other Ambulatory Visit: Payer: Self-pay

## 2016-03-23 VITALS — BP 118/70 | HR 86 | Temp 98.1°F | Ht 60.75 in | Wt 145.2 lb

## 2016-03-23 DIAGNOSIS — Z1159 Encounter for screening for other viral diseases: Secondary | ICD-10-CM

## 2016-03-23 DIAGNOSIS — Z0000 Encounter for general adult medical examination without abnormal findings: Secondary | ICD-10-CM | POA: Diagnosis not present

## 2016-03-23 MED ORDER — OMEPRAZOLE 20 MG PO CPDR: DELAYED_RELEASE_CAPSULE | ORAL | Status: DC

## 2016-03-23 MED ORDER — PRAVASTATIN SODIUM 80 MG PO TABS: ORAL_TABLET | ORAL | Status: DC

## 2016-03-23 NOTE — Progress Notes (Signed)
   Subjective:    Patient ID: Brianna [name], female    DOB: 1946-04-09, 70 y.o.   MRN: [id number]  HPI    Review of Systems     Objective:   Physical Exam        Assessment & Plan:  I reviewed health advisor's note, was available for consultation, and agree with documentation and plan.

## 2016-03-23 NOTE — Progress Notes (Signed)
Pre visit review using our clinic review tool, if applicable. No additional management support is needed unless otherwise documented below in the visit note. 

## 2016-03-23 NOTE — Progress Notes (Signed)
Subjective:   Brianna [name] is a 70 y.o. female who presents for Medicare Annual (Subsequent) preventive examination.  Review of Systems:  N/A Cardiac Risk Factors include: advanced age (>96men, >28 women);dyslipidemia     Objective:     Vitals: BP 118/70 mmHg  Pulse 86  Temp(Src) 98.1 F (36.7 C) (Oral)  Ht 5' 0.75" (1.543 m)  Wt 145 lb 4 oz (65.885 kg)  BMI 27.67 kg/m2  SpO2 98%  Body mass index is 27.67 kg/(m^2).   Tobacco History  Smoking status  . Never Smoker   Smokeless tobacco  . Never Used     Counseling given: No   Past Medical History  Diagnosis Date  . GERD (gastroesophageal reflux disease)   . Hyperlipidemia   . Osteoporosis     fosamax started 9/08  . Vitamin D deficiency   . Shingles   . Allergic rhinitis   . Breast carcinoma ([place]) 10/11    right- eventual double mastectomy in 2012   Past Surgical History  Procedure Laterality Date  . Partial hysterectomy  1980's    non cancer, ovaries intact  . Breast biopsy  10/11    invasive and in situ mam carcinoma  . Breast lumpectomy  11/11  . Mastectomy  1/12    double- Dr. Donne Hazel  . Mastectomy, radical Bilateral 11/2010  . Wrist fracture surgery Left     in the 1990's  . Cataract extraction      does not remember when   Family History  Problem Relation Age of Onset  . Dementia Mother   . Cancer Father     esophageal  . Diabetes Brother   . Cancer Paternal Aunt     ovarian   History  Sexual Activity  . Sexual Activity: No    Outpatient Encounter Prescriptions as of 03/23/2016  Medication Sig  . Calcium Carbonate-Vitamin D (CALCIUM-VITAMIN D) 600-200 MG-UNIT CAPS Take 2 by mouth daily   . Coenzyme Q10 (CO Q-10) 200 MG CAPS Take by mouth 2 (two) times daily.    . fexofenadine (ALLEGRA) 180 MG tablet Take otc as directed  . Multiple Vitamin (MULTIVITAMIN) tablet Take 1 tablet by mouth daily.    Marland Kitchen omeprazole (PRILOSEC) 20 MG capsule TAKE 1 CAPSULE (20 MG TOTAL) BY MOUTH DAILY.  .  pravastatin (PRAVACHOL) 80 MG tablet TAKE 1 TABLET (80 MG TOTAL) BY MOUTH DAILY.   No facility-administered encounter medications on file as of 03/23/2016.    Activities of Daily Living In your present state of health, do you have any difficulty performing the following activities: 03/23/2016  Hearing? Y  Vision? N  Difficulty concentrating or making decisions? N  Walking or climbing stairs? N  Dressing or bathing? N  Doing errands, shopping? N  Preparing Food and eating ? N  Using the Toilet? N  In the past six months, have you accidently leaked urine? Y  Do you have problems with loss of bowel control? N  Managing your Medications? N  Managing your Finances? N  Housekeeping or managing your Housekeeping? N    Patient Care Team: Abner Greenspan, MD as PCP - General Marica Otter, OD as Consulting Physician (Optometry)    Assessment:     Hearing Screening   125Hz  250Hz  500Hz  1000Hz  2000Hz  4000Hz  8000Hz   Right ear:   0 0 40 40   Left ear:   0 0 40 40   Vision Screening Comments: Last eye exam in 2016   Exercise Activities and  Dietary recommendations Current Exercise Habits: Home exercise routine, Type of exercise: walking, Time (Minutes): 60, Frequency (Times/Week): 5, Weekly Exercise (Minutes/Week): 300, Intensity: Mild, Exercise limited by: None identified  Goals    . memory     Starting 03/23/2016, I will continue to volunteer at my church, increase my reading time, and continue working part-time to improve my memory.      Fall Risk Fall Risk  03/23/2016 03/21/2015 03/19/2014 11/06/2012  Falls in the past year? No No No No   Depression Screen PHQ 2/9 Scores 03/23/2016 10/12/2015 07/08/2015 03/21/2015  PHQ - 2 Score 0 0 0 0     Cognitive Testing MMSE - Mini Mental State Exam 03/23/2016  Orientation to time 5  Orientation to Place 5  Registration 3  Attention/ Calculation 0  Recall 2  Language- name 2 objects 0  Language- repeat 1  Language- follow 3 step command 3    Language- read & follow direction 0  Write a sentence 0  Copy design 0  Total score 19   PLEASE NOTE: A Mini-Cog screen was completed. Maximum score is 20. A value of 0 denotes this part of [place] MMSE was not completed.  Orientation to Time - Max 5 Orientation to Place - Max 5 Registration - Max 3 Recall - Max 3 Language Repeat - Max 1 Language Follow 3 Step Command - Max 3  Immunization History  Administered Date(s) Administered  . Influenza Split 11/05/2011, 11/06/2012  . Influenza Whole 08/22/2008, 10/13/2009, 09/04/2010  . Influenza,inj,Quad PF,36+ Mos 08/29/2013, 10/06/2015  . PPD Test 08/29/2013  . Pneumococcal Conjugate-13 03/19/2014  . Pneumococcal Polysaccharide-23 10/21/2010  . Td 03/04/1999, 10/13/2009   Screening Tests Health Maintenance  Topic Date Due  . INFLUENZA VACCINE  06/22/2016  . TETANUS/TDAP  10/14/2019  . COLONOSCOPY  12/09/2024  . DEXA SCAN  Completed  . ZOSTAVAX  Completed  . Hepatitis C Screening  Completed  . PNA vac Low Risk Adult  Completed      Plan:     I have personally reviewed and addressed the Medicare Annual Wellness questionnaire and have noted the following in the patient's chart:  A. Medical and social history B. Use of alcohol, tobacco or illicit drugs  C. Current medications and supplements D. Functional ability and status E.  Nutritional status F.  Physical activity G. Advance directives H. List of other physicians I.  Hospitalizations, surgeries, and ER visits in previous 12 months J.  [place] to include hearing, vision, cognitive, depression L. Referrals and appointments - none  In addition, I have reviewed and discussed with patient certain preventive protocols, quality metrics, and best practice recommendations. A written personalized care plan for preventive services as well as general preventive health recommendations were provided to patient.  See attached scanned questionnaire for additional  information.   Signed,   Lindell Noe, MHA, BS, LPN Health Advisor

## 2016-03-23 NOTE — Patient Instructions (Signed)
Brianna [name] , Thank you for taking time to come for your Medicare Wellness Visit. I appreciate your ongoing commitment to your health goals. Please review the following plan we discussed and let me know if I can assist you in the future.   These are the goals we discussed: Goals    . memory     Starting 03/23/2016, I will continue to volunteer at my church, increase my reading time, and continue working part-time to improve my memory.       This is a list of the screening recommended for you and due dates:  Health Maintenance  Topic Date Due  . Flu Shot  06/22/2016  . Tetanus Vaccine  10/14/2019  . Colon Cancer Screening  12/09/2024  . DEXA scan (bone density measurement)  Completed  . Shingles Vaccine  Completed  .  Hepatitis C: One time screening is recommended by Center for Disease Control  (CDC) for  adults born from [birth year] through 1967.   Completed  . Pneumonia vaccines  Completed   Preventive Care for Adults  A healthy lifestyle and preventive care can promote health and wellness. Preventive health guidelines for adults include the following key practices.  . A routine yearly physical is a good way to check with your health care provider about your health and preventive screening. It is a chance to share any concerns and updates on your health and to receive a thorough exam.  . Visit your dentist for a routine exam and preventive care every 6 months. Brush your teeth twice a day and floss once a day. Good oral hygiene prevents tooth decay and gum disease.  . The frequency of eye exams is based on your age, health, family medical history, use  of contact lenses, and other factors. Follow your health care provider's ecommendations for frequency of eye exams.  . Eat a healthy diet. Foods like vegetables, fruits, whole grains, low-fat dairy products, and lean protein foods contain the nutrients you need without too many calories. Decrease your intake of foods high in solid fats, added  sugars, and salt. Eat the right amount of calories for you. Get information about a proper diet from your health care provider, if necessary.  . Regular physical exercise is one of the most important things you can do for your health. Most adults should get at least 150 minutes of moderate-intensity exercise (any activity that increases your heart rate and causes you to sweat) each week. In addition, most adults need muscle-strengthening exercises on 2 or more days a week.  Silver Sneakers may be a benefit available to you. To determine eligibility, you may visit the website: www.silversneakers.com or contact program at (316)615-7783 Mon-Fri between 8AM-8PM.   . Maintain a healthy weight. The body mass index (BMI) is a screening tool to identify possible weight problems. It provides an estimate of body fat based on height and weight. Your health care provider can find your BMI and can help you achieve or maintain a healthy weight.   For adults 20 years and older: ? A BMI below 18.5 is considered underweight. ? A BMI of 18.5 to 24.9 is normal. ? A BMI of 25 to 29.9 is considered overweight. ? A BMI of 30 and above is considered obese.   . Maintain normal blood lipids and cholesterol levels by exercising and minimizing your intake of saturated fat. Eat a balanced diet with plenty of fruit and vegetables. Blood tests for lipids and cholesterol should begin at age 90  and be repeated every 5 years. If your lipid or cholesterol levels are high, you are over 50, or you are at high risk for heart disease, you may need your cholesterol levels checked more frequently. Ongoing high lipid and cholesterol levels should be treated with medicines if diet and exercise are not working.  . If you smoke, find out from your health care provider how to quit. If you do not use tobacco, please do not start.  . If you choose to drink alcohol, please do not consume more than 2 drinks per day. One drink is considered to  be 12 ounces (355 mL) of beer, 5 ounces (148 mL) of wine, or 1.5 ounces (44 mL) of liquor.  . If you are 86-43 years old, ask your health care provider if you should take aspirin to prevent strokes.  . Use sunscreen. Apply sunscreen liberally and repeatedly throughout the day. You should seek shade when your shadow is shorter than you. Protect yourself by wearing long sleeves, pants, a wide-brimmed hat, and sunglasses year round, whenever you are outdoors.  . Once a month, do a whole body skin exam, using a mirror to look at the skin on your back. Tell your health care provider of new moles, moles that have irregular borders, moles that are larger than a pencil eraser, or moles that have changed in shape or color.

## 2016-03-23 NOTE — Telephone Encounter (Signed)
done

## 2016-03-23 NOTE — Telephone Encounter (Signed)
Please refill both for a year and I will see her later this month as planned

## 2016-03-23 NOTE — Telephone Encounter (Signed)
Patient was in today for AWV. Patient states she is completely out of Pravastatin and has one week remaining for Omeprazole. Patient has next appt with PCP on 04/12/16. Note: Patient is now using [facility] on [address]. This was updated during encounter.

## 2016-03-24 LAB — HEPATITIS C ANTIBODY: HCV AB: NEGATIVE

## 2016-04-03 ENCOUNTER — Ambulatory Visit (HOSPITAL_COMMUNITY)
Admission: EM | Admit: 2016-04-03 | Discharge: 2016-04-03 | Disposition: A | Discharge status: Home / Self Care | Payer: Medicare Other | Attending: Emergency Medicine | Admitting: Emergency Medicine

## 2016-04-03 ENCOUNTER — Encounter (HOSPITAL_COMMUNITY): Payer: Self-pay | Admitting: Emergency Medicine

## 2016-04-03 DIAGNOSIS — Z9013 Acquired absence of bilateral breasts and nipples: Secondary | ICD-10-CM | POA: Diagnosis not present

## 2016-04-03 DIAGNOSIS — E785 Hyperlipidemia, unspecified: Secondary | ICD-10-CM | POA: Diagnosis not present

## 2016-04-03 DIAGNOSIS — Z79899 Other long term (current) drug therapy: Secondary | ICD-10-CM | POA: Diagnosis not present

## 2016-04-03 DIAGNOSIS — R300 Dysuria: Secondary | ICD-10-CM | POA: Insufficient documentation

## 2016-04-03 DIAGNOSIS — Z853 Personal history of malignant neoplasm of breast: Secondary | ICD-10-CM | POA: Diagnosis not present

## 2016-04-03 DIAGNOSIS — E559 Vitamin D deficiency, unspecified: Secondary | ICD-10-CM | POA: Diagnosis not present

## 2016-04-03 DIAGNOSIS — N390 Urinary tract infection, site not specified: Secondary | ICD-10-CM

## 2016-04-03 DIAGNOSIS — K219 Gastro-esophageal reflux disease without esophagitis: Secondary | ICD-10-CM | POA: Diagnosis not present

## 2016-04-03 LAB — POCT URINALYSIS DIP (DEVICE)
Bilirubin Urine: NEGATIVE
GLUCOSE, UA: 100 mg/dL — AB
KETONES UR: NEGATIVE mg/dL
NITRITE: POSITIVE — AB
PH: 5 (ref 5.0–8.0)
PROTEIN: NEGATIVE mg/dL
Specific Gravity, Urine: 1.02 (ref 1.005–1.030)
UROBILINOGEN UA: 1 mg/dL (ref 0.0–1.0)

## 2016-04-03 MED ORDER — CEPHALEXIN 500 MG PO CAPS: 500.0000 mg | ORAL_CAPSULE | Freq: Four times a day (QID) | ORAL | Status: DC

## 2016-04-03 NOTE — Discharge Instructions (Signed)
You have a urinary tract infection. Take Keflex 4 times a day for 5 days. We will call you if we need to change antibiotics based on culture results. Follow-up as needed.

## 2016-04-03 NOTE — ED Notes (Signed)
The patient presented to the [facility] with a complaint of dysuria and a possible UTI x 2 days. The patient stated that she has been taking AZO OTC and it has helped some with the dysuria.

## 2016-04-03 NOTE — ED Provider Notes (Signed)
CSN: [id number]     Arrival date & time 04/03/16  1310 History   First MD Initiated Contact with Patient 04/03/16 1412     Chief Complaint  Patient presents with  . Dysuria   (Consider location/radiation/quality/duration/timing/severity/associated sxs/prior Treatment) HPI  She is a 70 year old woman here for evaluation of dysuria. She states symptoms started about 3 days ago. She reports dysuria, frequency, and urgency. She also reports some urge incontinence. No abdominal pain or back pain. No flank pain. No nausea or vomiting. No fevers or chills. She states this is the same as previous UTIs.  She has tried over-the-counter AZO without significant improvement.  Past Medical History  Diagnosis Date  . GERD (gastroesophageal reflux disease)   . Hyperlipidemia   . Osteoporosis     fosamax started 9/08  . Vitamin D deficiency   . Shingles   . Allergic rhinitis   . Breast carcinoma ([place]) 10/11    right- eventual double mastectomy in 2012   Past Surgical History  Procedure Laterality Date  . Partial hysterectomy  1980's    non cancer, ovaries intact  . Breast biopsy  10/11    invasive and in situ mam carcinoma  . Breast lumpectomy  11/11  . Mastectomy  1/12    double- Dr. Donne Hazel  . Mastectomy, radical Bilateral 11/2010  . Wrist fracture surgery Left     in the 1990's  . Cataract extraction      does not remember when   Family History  Problem Relation Age of Onset  . Dementia Mother   . Cancer Father     esophageal  . Diabetes Brother   . Cancer Paternal Aunt     ovarian   Social History  Substance Use Topics  . Smoking status: Never Smoker   . Smokeless tobacco: Never Used  . Alcohol Use: 0.0 oz/week    0 Standard drinks or equivalent per week     Comment: wine rarely   OB History    No data available     Review of Systems As in history of present illness Allergies  Pravachol and Zetia  Home Medications   Prior to Admission medications   Medication  Sig Start Date End Date Taking? Authorizing Provider  Calcium Carbonate-Vitamin D (CALCIUM-VITAMIN D) 600-200 MG-UNIT CAPS Take 2 by mouth daily    Yes Historical Provider, MD  Coenzyme Q10 (CO Q-10) 200 MG CAPS Take by mouth 2 (two) times daily.     Yes Historical Provider, MD  fexofenadine (ALLEGRA) 180 MG tablet Take otc as directed   Yes Historical Provider, MD  Multiple Vitamin (MULTIVITAMIN) tablet Take 1 tablet by mouth daily.     Yes Historical Provider, MD  omeprazole (PRILOSEC) 20 MG capsule TAKE 1 CAPSULE (20 MG TOTAL) BY MOUTH DAILY. 03/23/16  Yes Abner Greenspan, MD  pravastatin (PRAVACHOL) 80 MG tablet TAKE 1 TABLET (80 MG TOTAL) BY MOUTH DAILY. 03/23/16  Yes Abner Greenspan, MD  cephALEXin (KEFLEX) 500 MG capsule Take 1 capsule (500 mg total) by mouth 4 (four) times daily. 04/03/16   Melony Overly, MD   Meds Ordered and Administered this Visit  Medications - No data to display  BP 135/80 mmHg  Pulse 85  Temp(Src) 98.2 F (36.8 C) (Oral)  Resp 20  SpO2 99% No data found.   Physical Exam  Constitutional: She is oriented to person, place, and time. She appears well-developed and well-nourished. No distress.  Cardiovascular: Normal rate.   Pulmonary/Chest:  Effort normal.  Abdominal: Soft. She exhibits no distension. There is tenderness (mild in epigastric). There is no rebound and no guarding.  No CVA tenderness  Neurological: She is alert and oriented to person, place, and time.    ED Course  Procedures (including critical care time)  Labs Review Labs Reviewed  URINE CULTURE    Imaging Review No results found.   MDM   1. UTI (lower urinary tract infection)    History is consistent with UTI. Urine culture sent. Keflex for 5 days. Follow-up as needed.    Melony Overly, MD 04/03/16 [phone]

## 2016-04-06 LAB — URINE CULTURE

## 2016-04-08 ENCOUNTER — Telehealth (HOSPITAL_COMMUNITY): Payer: Self-pay | Admitting: Emergency Medicine

## 2016-04-08 NOTE — ED Notes (Unsigned)
Called pt and notified of recent lab results from visit 04/03/2016 Pt ID'd properly... Reports feeling better and sx have subsided.. Has one more day of antibiotics   Per Dr. Bridgett Larsson,  Clinical staff, please notify patient that urine culture does show a UTI. This is sensitive to the antibiotic (Keflex) she was prescribed. She should finish the entire course of antibiotics  Adv pt if sx are not getting better to return  Pt verb understanding

## 2016-04-12 ENCOUNTER — Ambulatory Visit (INDEPENDENT_AMBULATORY_CARE_PROVIDER_SITE_OTHER): Payer: Medicare Other | Admitting: Family Medicine

## 2016-04-12 ENCOUNTER — Encounter: Payer: Self-pay | Admitting: Family Medicine

## 2016-04-12 VITALS — BP 128/70 | HR 93 | Temp 98.0°F | Ht 60.75 in | Wt 145.8 lb

## 2016-04-12 DIAGNOSIS — C50911 Malignant neoplasm of unspecified site of right female breast: Secondary | ICD-10-CM

## 2016-04-12 DIAGNOSIS — E785 Hyperlipidemia, unspecified: Secondary | ICD-10-CM

## 2016-04-12 DIAGNOSIS — R1013 Epigastric pain: Secondary | ICD-10-CM | POA: Insufficient documentation

## 2016-04-12 DIAGNOSIS — R739 Hyperglycemia, unspecified: Secondary | ICD-10-CM

## 2016-04-12 DIAGNOSIS — E559 Vitamin D deficiency, unspecified: Secondary | ICD-10-CM

## 2016-04-12 DIAGNOSIS — M8580 Other specified disorders of bone density and structure, unspecified site: Secondary | ICD-10-CM | POA: Diagnosis not present

## 2016-04-12 DIAGNOSIS — Z87440 Personal history of urinary (tract) infections: Secondary | ICD-10-CM

## 2016-04-12 LAB — POC URINALSYSI DIPSTICK (AUTOMATED)
Glucose, UA: NEGATIVE
Ketones, UA: NEGATIVE
LEUKOCYTES UA: NEGATIVE
NITRITE UA: NEGATIVE
PH UA: 6
PROTEIN UA: NEGATIVE
Spec Grav, UA: >=1.03
UROBILINOGEN UA: 0.2

## 2016-04-12 NOTE — Progress Notes (Signed)
Pre visit review using our clinic review tool, if applicable. No additional management support is needed unless otherwise documented below in the visit note. 

## 2016-04-12 NOTE — Assessment & Plan Note (Signed)
Now off tamoxifen, doing well

## 2016-04-12 NOTE — Assessment & Plan Note (Signed)
Disc goals for lipids and reasons to control them Rev labs with pt Rev low sat fat diet in detail LDL and trig are up  On pravastatin  Enc dec fat and sugar in diet

## 2016-04-12 NOTE — Progress Notes (Signed)
Subjective:    Patient ID: Brianna [name], female    DOB: [birth year], 70 y.o.   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 and now hands itch  Used moisturizer and oral benadryl    Had AMW visit with Katha Cabal  Did well  Missed one recall question on her memory screen  Hearing exam -not perfect  Wondered if she needed ears looked at  She does not notice much in terms of hearing loss / struggles to hear only soft spoken people   Wt is stable with bmi of 27 Is getting enough exercise  Not eating a healthy diet / take it by spells - she likes sweets /chips and dips (once in a while-not regularly) Walking a lot    Had a recent uti (feels better)  ua clear today  Colonoscopy 6/16 nl  Per Dr Allyn Kenner 10 year recall   dexa 6/16 =stable osteopenia  No falls  Fracture hx - hx of broken wrist playing tennis in the past , fx foot many years ago dropping something heavy on it  Vit D is low at 23.2  Not taking vit D regularly -admits to it (or calcium)  She takes ppi daily- does ok if she skips a dose  However lately more burping (has also been taking aleve for aches and pains)  Is off tamoxifen now    Hep C screen negative  Hx of breast cancer with bilat mastectomies  Does not get mammograms for that reason    Hyperglycemia  Lab Results  Component Value Date   HGBA1C 5.7 03/16/2016   Was 5.4 last check  Perhaps a little more sweets   Cholesterol Lab Results  Component Value Date   CHOL 204* 03/16/2016   CHOL 174 03/14/2015   CHOL 197 03/04/2014   Lab Results  Component Value Date   HDL 47.30 03/16/2016   HDL 43.60 03/14/2015   HDL 42.10 03/04/2014   Lab Results  Component Value Date   LDLCALC 119* 03/16/2016   LDLCALC 107* 03/14/2015   LDLCALC 109* 03/04/2014   Lab Results  Component Value Date   TRIG 189.0*  03/16/2016   TRIG 117.0 03/14/2015   TRIG 229.0* 03/04/2014   Lab Results  Component Value Date   CHOLHDL 4 03/16/2016   CHOLHDL 4 03/14/2015   CHOLHDL 5 03/04/2014   Lab Results  Component Value Date   LDLDIRECT 119.9 10/30/2012   LDLDIRECT 136.2 11/02/2011   LDLDIRECT 185.7 08/03/2011   LDl up a little  HDL is up too- from walking  Trig up  Is on pravastatin  Is off tamoxifen now    Nl tsh Lab Results  Component Value Date   TSH 3.43 03/16/2016     Patient Active Problem List   Diagnosis Date Noted  . Pre-employment examination 08/29/2013    Priority: Medium  . Hyperglycemia 03/21/2015  . Nocturnal leg cramps 03/21/2015  . Estrogen deficiency 03/21/2015  . Breast cancer, right breast ([place]) 12/26/2014  . Encounter for Medicare annual wellness exam 02/28/2014  . Vitamin D deficiency 10/11/2008  . Hyperlipidemia 07/07/2007  . GERD 07/07/2007  . Osteopenia 07/07/2007  . Fatty liver 07/07/2007   Past Medical History  Diagnosis Date  . GERD (gastroesophageal reflux disease)   . Hyperlipidemia   . Osteoporosis  fosamax started 9/08  . Vitamin D deficiency   . Shingles   . Allergic rhinitis   . Breast carcinoma ([place]) 10/11    right- eventual double mastectomy in 2012   Past Surgical History  Procedure Laterality Date  . Partial hysterectomy  1980's    non cancer, ovaries intact  . Breast biopsy  10/11    invasive and in situ mam carcinoma  . Breast lumpectomy  11/11  . Mastectomy  1/12    double- Dr. Donne Hazel  . Mastectomy, radical Bilateral 11/2010  . Wrist fracture surgery Left     in the 1990's  . Cataract extraction      does not remember when   Social History  Substance Use Topics  . Smoking status: Never Smoker   . Smokeless tobacco: Never Used  . Alcohol Use: 0.0 oz/week    0 Standard drinks or equivalent per week     Comment: wine rarely   Family History  Problem Relation Age of Onset  . Dementia Mother   . Cancer Father      esophageal  . Diabetes Brother   . Cancer Paternal Aunt     ovarian   Allergies  Allergen Reactions  . Pravachol     Myalgias  (As of visit on 05/17/2013, patient is taking Pravachol, but continues to have muscle pain)  . Zetia [Ezetimibe] Other (See Comments)    Ineffective, elevated liver enzymes   Current Outpatient Prescriptions on File Prior to Visit  Medication Sig Dispense Refill  . Calcium Carbonate-Vitamin D (CALCIUM-VITAMIN D) 600-200 MG-UNIT CAPS Take 2 by mouth daily     . Coenzyme Q10 (CO Q-10) 200 MG CAPS Take by mouth 2 (two) times daily.      . fexofenadine (ALLEGRA) 180 MG tablet Take otc as directed    . Multiple Vitamin (MULTIVITAMIN) tablet Take 1 tablet by mouth daily.      Marland Kitchen omeprazole (PRILOSEC) 20 MG capsule TAKE 1 CAPSULE (20 MG TOTAL) BY MOUTH DAILY. 90 capsule 3  . pravastatin (PRAVACHOL) 80 MG tablet TAKE 1 TABLET (80 MG TOTAL) BY MOUTH DAILY. 90 tablet 3   No current facility-administered medications on file prior to visit.    Review of Systems Review of Systems  Constitutional: Negative for fever, appetite change, fatigue and unexpected weight change.  Eyes: Negative for pain and visual disturbance.  ENT pos for hearing loss  Respiratory: Negative for cough and shortness of breath.   Cardiovascular: Negative for cp or palpitations    Gastrointestinal: Negative for nausea, diarrhea and constipation.  Genitourinary: Negative for urgency and frequency. neg for excessive thirst  Skin: Negative for pallor or rash  pos for irritated hands from recent bleach exposure  Neurological: Negative for weakness, light-headedness, numbness and headaches.  Hematological: Negative for adenopathy. Does not bruise/bleed easily.  Psychiatric/Behavioral: Negative for dysphoric mood. The patient is not nervous/anxious.         Objective:   Physical Exam  Constitutional: She appears well-developed and well-nourished. No distress.  HENT:  Head: Normocephalic and  atraumatic.  Right Ear: External ear normal.  Left Ear: External ear normal.  Mouth/Throat: Oropharynx is clear and moist.  Eyes: Conjunctivae and EOM are normal. Pupils are equal, round, and reactive to light. No scleral icterus.  Neck: Normal range of motion. Neck supple. No JVD present. Carotid bruit is not present. No thyromegaly present.  Cardiovascular: Normal rate, regular rhythm, normal heart sounds and intact distal pulses.  Exam reveals no gallop.  Pulmonary/Chest: Effort normal and breath sounds normal. No respiratory distress. She has no wheezes. She exhibits no tenderness.  Abdominal: Soft. Bowel sounds are normal. She exhibits no distension, no abdominal bruit, no pulsatile midline mass and no mass. There is no hepatosplenomegaly. There is tenderness in the epigastric area and left upper quadrant. There is no rebound, no CVA tenderness, no tenderness at McBurney's point and negative Murphy's sign.  Mild epigastric and LUQ tenderness   Genitourinary:  Chest wall exam-no breast tissue s/p mastectomy  No tenderness or lumps noted  Musculoskeletal: Normal range of motion. She exhibits no edema or tenderness.  Lymphadenopathy:    She has no cervical adenopathy.  Neurological: She is alert. She has normal reflexes. No cranial nerve deficit. She exhibits normal muscle tone. Coordination normal.  Skin: Skin is warm and dry. No rash noted. No erythema. No pallor.  Dry skin on palms of hands   Psychiatric: She has a normal mood and affect.          Assessment & Plan:   Problem List Items Addressed This Visit      Musculoskeletal and Integument   Osteopenia - Primary    Stable on dexa 6/16  No falls or fx Enc to get back on ca and D compliantly  Disc need for calcium/ vitamin D/ wt bearing exercise and bone density test every 2 y to monitor Disc safety/ fracture risk in detail          Other   Vitamin D deficiency    Level is low due to noncompliance with D  Enc to get  back on ca plus D and extra D3  Stressed imp to bone and overall health       Hyperlipidemia    Disc goals for lipids and reasons to control them Rev labs with pt Rev low sat fat diet in detail LDL and trig are up  On pravastatin  Enc dec fat and sugar in diet       Hyperglycemia    Lab Results  Component Value Date   HGBA1C 5.7 03/16/2016   This is fairly well controlled Disc wt control and low glycemic diet to prevent DM      Dyspepsia    Suspect due to recent naproxen inst to stop it and change to acetaminophen if needed  Also continue PPI daily for now Update if worse or not improving        Other Visit Diagnoses    History of UTI        Relevant Orders    POCT Urinalysis Dipstick (Automated) (Completed)

## 2016-04-12 NOTE — Assessment & Plan Note (Signed)
Lab Results  Component Value Date   HGBA1C 5.7 03/16/2016   This is fairly well controlled Disc wt control and low glycemic diet to prevent DM

## 2016-04-12 NOTE — Assessment & Plan Note (Signed)
Suspect due to recent naproxen inst to stop it and change to acetaminophen if needed  Also continue PPI daily for now Update if worse or not improving

## 2016-04-12 NOTE — Patient Instructions (Signed)
Take care of yourself  Ears look ok  (if your hearing starts to bother you let me know- be mindful of it_ Urine looks clear today  Get back on calcium and vitamin E  If your stomach/gas/abdominal issues worsen or do not improve please let me know  Stay off the soda  Labs are fairly stable Watch fat and sugar in your diet and stay active

## 2016-04-12 NOTE — Assessment & Plan Note (Signed)
Level is low due to noncompliance with D  Enc to get back on ca plus D and extra D3  Stressed imp to bone and overall health

## 2016-04-12 NOTE — Assessment & Plan Note (Signed)
Stable on dexa 6/16  No falls or fx Enc to get back on ca and D compliantly  Disc need for calcium/ vitamin D/ wt bearing exercise and bone density test every 2 y to monitor Disc safety/ fracture risk in detail

## 2016-08-13 ENCOUNTER — Encounter: Payer: Self-pay | Admitting: Family Medicine

## 2016-08-13 ENCOUNTER — Ambulatory Visit (INDEPENDENT_AMBULATORY_CARE_PROVIDER_SITE_OTHER): Payer: Medicare Other | Admitting: Family Medicine

## 2016-08-13 VITALS — BP 128/66 | HR 86 | Temp 98.3°F | Ht 60.75 in | Wt 147.2 lb

## 2016-08-13 DIAGNOSIS — Z23 Encounter for immunization: Secondary | ICD-10-CM | POA: Diagnosis not present

## 2016-08-13 DIAGNOSIS — R600 Localized edema: Secondary | ICD-10-CM | POA: Diagnosis not present

## 2016-08-13 LAB — BRAIN NATRIURETIC PEPTIDE: PRO B NATRI PEPTIDE: 18 pg/mL (ref 0.0–100.0)

## 2016-08-13 LAB — COMPREHENSIVE METABOLIC PANEL
ALT: 57 U/L — AB (ref 0–35)
AST: 32 U/L (ref 0–37)
Albumin: 4 g/dL (ref 3.5–5.2)
Alkaline Phosphatase: 47 U/L (ref 39–117)
BILIRUBIN TOTAL: 0.7 mg/dL (ref 0.2–1.2)
BUN: 18 mg/dL (ref 6–23)
CHLORIDE: 108 meq/L (ref 96–112)
CO2: 30 meq/L (ref 19–32)
CREATININE: 1 mg/dL (ref 0.40–1.20)
Calcium: 9.3 mg/dL (ref 8.4–10.5)
GFR: 57.89 mL/min — AB (ref >60.00)
GLUCOSE: 93 mg/dL (ref 70–99)
Potassium: 4.9 mEq/L (ref 3.5–5.1)
Sodium: 144 mEq/L (ref 135–145)
Total Protein: 6.5 g/dL (ref 6.0–8.3)

## 2016-08-13 LAB — TSH: TSH: 2.39 u[IU]/mL (ref 0.35–4.50)

## 2016-08-13 NOTE — Patient Instructions (Addendum)
Labs today  I think you may have some venous insufficiency  Elevate feet when you sit Drink more water Eat less salt and processed foods  Know that aleve will make you swell a bit more  Try your support stockings for work and standing This should improve as it cools down   If symptoms worsen or change or if any shortness of breath please alert me

## 2016-08-13 NOTE — Progress Notes (Signed)
Subjective:    Patient ID: Brianna [name], female    DOB: 1945-12-09, 70 y.o.   MRN: [id number]  HPI Here for swelling of feet   Goes down overnight  Gets worse as the day goes on  Started about 3 weeks   Usually swells a bit in the summer- but this is much worse  Uncomfortable  Burning and tight feeling and aching  Some shoes are hard to put on   Stands 6 h per day - always moving however  No air conditioning at her work place   Diet: she loves salt / processed food/ pre prepped frozen meals    No cp or sob or signs of CHF   Takes aleve - occ/ took one Wednesday- about once per week for back pain    Wt Readings from Last 3 Encounters:  08/13/16 147 lb 4 oz (66.8 kg)  04/12/16 145 lb 12 oz (66.1 kg)  03/23/16 145 lb 4 oz (65.9 kg)   bmi is 28.0  BP Readings from Last 3 Encounters:  08/13/16 128/66  04/12/16 128/70  04/03/16 135/80    She has some old knee high supp hose at home-has not used them    Patient Active Problem List   Diagnosis Date Noted  . Pre-employment examination 08/29/2013    Priority: Medium  . Pedal edema 08/13/2016  . Dyspepsia 04/12/2016  . Hyperglycemia 03/21/2015  . Nocturnal leg cramps 03/21/2015  . Estrogen deficiency 03/21/2015  . Breast cancer, right breast ([place]) 12/26/2014  . Encounter for Medicare annual wellness exam 02/28/2014  . Vitamin D deficiency 10/11/2008  . Hyperlipidemia 07/07/2007  . GERD 07/07/2007  . Osteopenia 07/07/2007  . Fatty liver 07/07/2007   Past Medical History:  Diagnosis Date  . Allergic rhinitis   . Breast carcinoma ([place]) 10/11   right- eventual double mastectomy in 2012  . GERD (gastroesophageal reflux disease)   . Hyperlipidemia   . Osteoporosis    fosamax started 9/08  . Shingles   . Vitamin D deficiency    Past Surgical History:  Procedure Laterality Date  . BREAST BIOPSY  10/11   invasive and in situ mam carcinoma  . BREAST LUMPECTOMY  11/11  . CATARACT EXTRACTION     does not  remember when  . MASTECTOMY  1/12   double- Dr. Donne Hazel  . MASTECTOMY, RADICAL Bilateral 11/2010  . PARTIAL HYSTERECTOMY  1980's   non cancer, ovaries intact  . WRIST FRACTURE SURGERY Left    in the 1990's   Social History  Substance Use Topics  . Smoking status: Never Smoker  . Smokeless tobacco: Never Used  . Alcohol use 0.0 oz/week     Comment: wine rarely   Family History  Problem Relation Age of Onset  . Dementia Mother   . Cancer Father     esophageal  . Diabetes Brother   . Cancer Paternal Aunt     ovarian   Allergies  Allergen Reactions  . Pravachol     Myalgias  (As of visit on 05/17/2013, patient is taking Pravachol, but continues to have muscle pain)  . Zetia [Ezetimibe] Other (See Comments)    Ineffective, elevated liver enzymes   Current Outpatient Prescriptions on File Prior to Visit  Medication Sig Dispense Refill  . Calcium Carbonate-Vitamin D (CALCIUM-VITAMIN D) 600-200 MG-UNIT CAPS Take 2 by mouth daily     . Coenzyme Q10 (CO Q-10) 200 MG CAPS Take by mouth 2 (two) times daily.      Marland Kitchen  fexofenadine (ALLEGRA) 180 MG tablet Take otc as directed    . Multiple Vitamin (MULTIVITAMIN) tablet Take 1 tablet by mouth daily.      Marland Kitchen omeprazole (PRILOSEC) 20 MG capsule TAKE 1 CAPSULE (20 MG TOTAL) BY MOUTH DAILY. 90 capsule 3  . pravastatin (PRAVACHOL) 80 MG tablet TAKE 1 TABLET (80 MG TOTAL) BY MOUTH DAILY. 90 tablet 3   No current facility-administered medications on file prior to visit.     Review of Systems Review of Systems  Constitutional: Negative for fever, appetite change, fatigue and unexpected weight change.  Eyes: Negative for pain and visual disturbance.  Respiratory: Negative for cough and shortness of breath.   Cardiovascular: Negative for cp or palpitations   pos for ankle edema /neg for PND or orthopnea Gastrointestinal: Negative for nausea, diarrhea and constipation.  Genitourinary: Negative for urgency and frequency.  Skin: Negative for  pallor or rash   Neurological: Negative for weakness, light-headedness, numbness and headaches.  Hematological: Negative for adenopathy. Does not bruise/bleed easily.  Psychiatric/Behavioral: Negative for dysphoric mood. The patient is not nervous/anxious.         Objective:   Physical Exam  Constitutional: She appears well-developed and well-nourished. No distress.  Well appearing   HENT:  Head: Normocephalic and atraumatic.  Mouth/Throat: Oropharynx is clear and moist.  Eyes: Conjunctivae and EOM are normal. Pupils are equal, round, and reactive to light.  Neck: Normal range of motion. Neck supple. No JVD present. Carotid bruit is not present. No thyromegaly present.  Cardiovascular: Normal rate, regular rhythm, normal heart sounds and intact distal pulses.  Exam reveals no gallop.   Some LE spider veins and a few deeper varicosities  Pulmonary/Chest: Effort normal and breath sounds normal. No respiratory distress. She has no wheezes. She has no rales.  No crackles  Abdominal: Soft. Bowel sounds are normal. She exhibits no distension, no abdominal bruit and no mass. There is no tenderness.  Musculoskeletal: She exhibits edema. She exhibits no tenderness or deformity.  Trace to plus one ankle edema bilat  No calf or thigh tenderness Neg homanns  Spider veins and a few deeper varicosities noted  Nl gait  Nl perfusion and sens  Lymphadenopathy:    She has no cervical adenopathy.  Neurological: She is alert. She has normal reflexes. No cranial nerve deficit. She exhibits normal muscle tone. Coordination normal.  Skin: Skin is warm and dry. No rash noted. No erythema. No pallor.  Psychiatric: She has a normal mood and affect.          Assessment & Plan:   Problem List Items Addressed This Visit      Other   Pedal edema - Primary    Nl exam and no cardiac red flags Lab today incl chem/tsh/bnp Disc elevation of legs and use of supp stockings for venous insufficiency  Given  DASH eating plan handout  Enc to inc water and dec sodium  Aware nsaids can worsen this  Update if symptoms worsen or do not improve      Relevant Orders   Comprehensive metabolic panel (Completed)   TSH (Completed)   Brain natriuretic peptide (Completed)    Other Visit Diagnoses    Need for influenza vaccination       Relevant Orders   Flu Vaccine QUAD 36+ mos IM (Completed)

## 2016-08-13 NOTE — Progress Notes (Signed)
Pre visit review using our clinic review tool, if applicable. No additional management support is needed unless otherwise documented below in the visit note. 

## 2016-08-15 NOTE — Assessment & Plan Note (Signed)
Nl exam and no cardiac red flags Lab today incl chem/tsh/bnp Disc elevation of legs and use of supp stockings for venous insufficiency  Given DASH eating plan handout  Enc to inc water and dec sodium  Aware nsaids can worsen this  Update if symptoms worsen or do not improve

## 2016-08-27 ENCOUNTER — Other Ambulatory Visit: Payer: Self-pay | Admitting: Family Medicine

## 2016-08-27 DIAGNOSIS — R7989 Other specified abnormal findings of blood chemistry: Secondary | ICD-10-CM

## 2016-08-27 DIAGNOSIS — R945 Abnormal results of liver function studies: Principal | ICD-10-CM

## 2016-09-06 ENCOUNTER — Other Ambulatory Visit (INDEPENDENT_AMBULATORY_CARE_PROVIDER_SITE_OTHER): Payer: Medicare Other

## 2016-09-06 DIAGNOSIS — R7989 Other specified abnormal findings of blood chemistry: Secondary | ICD-10-CM | POA: Diagnosis not present

## 2016-09-06 DIAGNOSIS — R945 Abnormal results of liver function studies: Principal | ICD-10-CM

## 2016-09-06 LAB — HEPATIC FUNCTION PANEL
ALK PHOS: 56 U/L (ref 39–117)
ALT: 64 U/L — ABNORMAL HIGH (ref 0–35)
AST: 37 U/L (ref 0–37)
Albumin: 4.4 g/dL (ref 3.5–5.2)
BILIRUBIN DIRECT: 0.1 mg/dL (ref 0.0–0.3)
BILIRUBIN TOTAL: 0.6 mg/dL (ref 0.2–1.2)
TOTAL PROTEIN: 6.9 g/dL (ref 6.0–8.3)

## 2016-09-10 ENCOUNTER — Ambulatory Visit (INDEPENDENT_AMBULATORY_CARE_PROVIDER_SITE_OTHER): Payer: Medicare Other | Admitting: Family Medicine

## 2016-09-10 ENCOUNTER — Encounter: Payer: Self-pay | Admitting: Family Medicine

## 2016-09-10 VITALS — BP 126/68 | HR 94 | Temp 98.0°F | Ht 60.75 in | Wt 145.5 lb

## 2016-09-10 DIAGNOSIS — I872 Venous insufficiency (chronic) (peripheral): Secondary | ICD-10-CM | POA: Diagnosis not present

## 2016-09-10 DIAGNOSIS — K760 Fatty (change of) liver, not elsewhere classified: Secondary | ICD-10-CM | POA: Diagnosis not present

## 2016-09-10 DIAGNOSIS — R600 Localized edema: Secondary | ICD-10-CM

## 2016-09-10 MED ORDER — HYDROCHLOROTHIAZIDE 12.5 MG PO CAPS: 12.5000 mg | ORAL_CAPSULE | Freq: Every day | ORAL | 11 refills | Status: DC

## 2016-09-10 NOTE — Progress Notes (Signed)
Subjective:    Patient ID: Brianna [name], female    DOB: [birth year], 70 y.o.   MRN: [id number]  HPI Here for f/u of pedal edema  Not improved after last visit  She is using supp hose to knee otc (? What strength) -- helps some but not very tolerable  When she takes them off at night she has edema under them  And the support hose are making the middle of her foot hurt (creasing in her arch)   She has increased her water a lot - no difference  Gave up her pepsi  Wt Readings from Last 3 Encounters:  09/10/16 145 lb 8 oz (66 kg)  08/13/16 147 lb 4 oz (66.8 kg)  04/12/16 145 lb 12 oz (66.1 kg)     BP Readings from Last 3 Encounters:  09/10/16 126/68  08/13/16 128/66  04/12/16 128/70      Was seen 9/22 for this  Suspected it was due to venous insufficiency and prolonged standing in warmer weather  She did not have any cardiac symptoms   Labs done Lab on 09/06/2016  Component Date Value Ref Range Status  . Total Bilirubin 09/06/2016 0.6  0.2 - 1.2 mg/dL Final  . Bilirubin, Direct 09/06/2016 0.1  0.0 - 0.3 mg/dL Final  . Alkaline Phosphatase 09/06/2016 56  39 - 117 U/L Final  . AST 09/06/2016 37  0 - 37 U/L Final  . ALT 09/06/2016 64* 0 - 35 U/L Final  . Total Protein 09/06/2016 6.9  6.0 - 8.3 g/dL Final  . Albumin 09/06/2016 4.4  3.5 - 5.2 g/dL Final      She is holding pravastatin for poss cause of inc ALT currently  She also has hx of fatty liver    Chemistry      Component Value Date/Time   NA 144 08/13/2016 1443   NA 144 12/30/2015 1355   K 4.9 08/13/2016 1443   K 4.1 12/30/2015 1355   CL 108 08/13/2016 1443   CO2 30 08/13/2016 1443   CO2 23 12/30/2015 1355   BUN 18 08/13/2016 1443   BUN 14.7 12/30/2015 1355   CREATININE 1.00 08/13/2016 1443   CREATININE 0.9 12/30/2015 1355      Component Value Date/Time   CALCIUM 9.3 08/13/2016 1443   CALCIUM 9.1 12/30/2015 1355   ALKPHOS 56 09/06/2016 1404   ALKPHOS 52 12/30/2015 1355   AST 37 09/06/2016 1404   AST 18 12/30/2015 1355   ALT 64 (H) 09/06/2016 1404   ALT 22 12/30/2015 1355   BILITOT 0.6 09/06/2016 1404   BILITOT 0.44 12/30/2015 1355      Lab Results  Component Value Date   TSH 2.39 08/13/2016   BNP was nl at 18.0 Glucose at 93  No protein in last ua   Is open to small dose of a diuretic   Patient Active Problem List   Diagnosis Date Noted  . Pre-employment examination 08/29/2013    Priority: Medium  . Chronic venous insufficiency 09/10/2016  . Pedal edema 08/13/2016  . Dyspepsia 04/12/2016  . Hyperglycemia 03/21/2015  . Nocturnal leg cramps 03/21/2015  . Estrogen deficiency 03/21/2015  . Breast cancer, right breast ([place]) 12/26/2014  . Encounter for Medicare annual wellness exam 02/28/2014  . Vitamin D deficiency 10/11/2008  . Hyperlipidemia 07/07/2007  . GERD 07/07/2007  . Osteopenia 07/07/2007  . Fatty liver 07/07/2007   Past Medical History:  Diagnosis Date  . Allergic rhinitis   . Breast carcinoma ([place])  10/11   right- eventual double mastectomy in 2012  . GERD (gastroesophageal reflux disease)   . Hyperlipidemia   . Osteoporosis    fosamax started 9/08  . Shingles   . Vitamin D deficiency    Past Surgical History:  Procedure Laterality Date  . BREAST BIOPSY  10/11   invasive and in situ mam carcinoma  . BREAST LUMPECTOMY  11/11  . CATARACT EXTRACTION     does not remember when  . MASTECTOMY  1/12   double- Dr. Donne Hazel  . MASTECTOMY, RADICAL Bilateral 11/2010  . PARTIAL HYSTERECTOMY  1980's   non cancer, ovaries intact  . WRIST FRACTURE SURGERY Left    in the 1990's   Social History  Substance Use Topics  . Smoking status: Never Smoker  . Smokeless tobacco: Never Used  . Alcohol use 0.0 oz/week     Comment: wine rarely   Family History  Problem Relation Age of Onset  . Dementia Mother   . Cancer Father     esophageal  . Diabetes Brother   . Cancer Paternal Aunt     ovarian   Allergies  Allergen Reactions  . Pravachol      Myalgias  (As of visit on 05/17/2013, patient is taking Pravachol, but continues to have muscle pain)  . Zetia [Ezetimibe] Other (See Comments)    Ineffective, elevated liver enzymes   Current Outpatient Prescriptions on File Prior to Visit  Medication Sig Dispense Refill  . Calcium Carbonate-Vitamin D (CALCIUM-VITAMIN D) 600-200 MG-UNIT CAPS Take 2 by mouth daily     . Coenzyme Q10 (CO Q-10) 200 MG CAPS Take by mouth 2 (two) times daily.      . fexofenadine (ALLEGRA) 180 MG tablet Take otc as directed    . Multiple Vitamin (MULTIVITAMIN) tablet Take 1 tablet by mouth daily.      Marland Kitchen omeprazole (PRILOSEC) 20 MG capsule TAKE 1 CAPSULE (20 MG TOTAL) BY MOUTH DAILY. 90 capsule 3  . pravastatin (PRAVACHOL) 80 MG tablet TAKE 1 TABLET (80 MG TOTAL) BY MOUTH DAILY. (Patient not taking: Reported on 09/10/2016) 90 tablet 3   No current facility-administered medications on file prior to visit.     Review of Systems Review of Systems  Constitutional: Negative for fever, appetite change, fatigue and unexpected weight change.  Eyes: Negative for pain and visual disturbance.  Respiratory: Negative for cough and shortness of breath.   Cardiovascular: Negative for cp or palpitations   neg for PND or orthopnea  Gastrointestinal: Negative for nausea, diarrhea and constipation. neg for abd pain  Genitourinary: Negative for urgency and frequency.  Skin: Negative for pallor or rash   Neurological: Negative for weakness, light-headedness, numbness and headaches.  Hematological: Negative for adenopathy. Does not bruise/bleed easily.  Psychiatric/Behavioral: Negative for dysphoric mood. The patient is not nervous/anxious.         Objective:   Physical Exam  Constitutional: She appears well-developed and well-nourished. No distress.  Well appearing   HENT:  Head: Normocephalic and atraumatic.  Mouth/Throat: Oropharynx is clear and moist.  Eyes: Conjunctivae and EOM are normal. Pupils are equal, round, and  reactive to light.  Neck: Normal range of motion. Neck supple. No JVD present. Carotid bruit is not present. No thyromegaly present.  Cardiovascular: Normal rate, regular rhythm, normal heart sounds and intact distal pulses.  Exam reveals no gallop.   No large varicosities noted   Pulmonary/Chest: Effort normal and breath sounds normal. No respiratory distress. She has no wheezes. She  has no rales.  No crackles  Abdominal: Soft. Bowel sounds are normal. She exhibits no distension, no abdominal bruit and no mass. There is no tenderness.  No HSM  Musculoskeletal: She exhibits no edema.  Trace if any pedal edema today (pt notes better today)  No pitting No calf tenderness or palp cords Nl circulation    Lymphadenopathy:    She has no cervical adenopathy.  Neurological: She is alert. She has normal reflexes.  Skin: Skin is warm and dry. No rash noted.  Psychiatric: She has a normal mood and affect.          Assessment & Plan:   Problem List Items Addressed This Visit      Cardiovascular and Mediastinum   Chronic venous insufficiency    Suspect this is the main cause of her pedal edema  Her current supp socks hurt her feet/do not fit correctly  Wrote px for supp hose to the knee 15-20 mm Hg  Will see if she can be measured  Also rec supportive shoes       Relevant Medications   hydrochlorothiazide (MICROZIDE) 12.5 MG capsule     Digestive   Fatty liver    Suspect this is the cause of ALT elevation (now and past) Holding statin to see if that is adding  rec wt loss and low fat diet  Lab in 1-2 weeks off of statin (holding)        Other   Pedal edema - Primary    Suspect due to venous insuff primarily  supp hose hurt-px new pair to be measured for  Will continue to work on low sodium diet and water intake  Elevate feet when sitting Re assuring labs rev No cardiac red flags Px hctz 12.5 mg daily - lab in 1-2 wk Update if problems       Other Visit Diagnoses     None.

## 2016-09-10 NOTE — Assessment & Plan Note (Signed)
Suspect due to venous insuff primarily  supp hose hurt-px new pair to be measured for  Will continue to work on low sodium diet and water intake  Elevate feet when sitting Re assuring labs rev No cardiac red flags Px hctz 12.5 mg daily - lab in 1-2 wk Update if problems

## 2016-09-10 NOTE — Patient Instructions (Signed)
Try the hctz 12.5 mg once daily in the am for swelling  Schedule a non fasting lab appt for 1-2 weeks for electrolytes and to re check liver test  Stay off the pravastatin for now (watch your diet for fat and cholesterol) See if you can get the support stockings by px - perhaps they will fit better  I recommend [employer] clogs for work shoes

## 2016-09-10 NOTE — Assessment & Plan Note (Signed)
Suspect this is the cause of ALT elevation (now and past) Holding statin to see if that is adding  rec wt loss and low fat diet  Lab in 1-2 weeks off of statin (holding)

## 2016-09-10 NOTE — Progress Notes (Signed)
Pre visit review using our clinic review tool, if applicable. No additional management support is needed unless otherwise documented below in the visit note. 

## 2016-09-10 NOTE — Assessment & Plan Note (Signed)
Suspect this is the main cause of her pedal edema  Her current supp socks hurt her feet/do not fit correctly  Wrote px for supp hose to the knee 15-20 mm Hg  Will see if she can be measured  Also rec supportive shoes

## 2016-09-23 ENCOUNTER — Other Ambulatory Visit (INDEPENDENT_AMBULATORY_CARE_PROVIDER_SITE_OTHER): Payer: Medicare Other

## 2016-09-23 DIAGNOSIS — R600 Localized edema: Secondary | ICD-10-CM

## 2016-09-23 DIAGNOSIS — K760 Fatty (change of) liver, not elsewhere classified: Secondary | ICD-10-CM

## 2016-09-23 LAB — COMPREHENSIVE METABOLIC PANEL
ALT: 42 U/L — ABNORMAL HIGH (ref 0–35)
AST: 24 U/L (ref 0–37)
Albumin: 4.2 g/dL (ref 3.5–5.2)
Alkaline Phosphatase: 57 U/L (ref 39–117)
BUN: 18 mg/dL (ref 6–23)
CALCIUM: 9.9 mg/dL (ref 8.4–10.5)
CHLORIDE: 101 meq/L (ref 96–112)
CO2: 32 meq/L (ref 19–32)
Creatinine, Ser: 1.01 mg/dL (ref 0.40–1.20)
GFR: 57.21 mL/min — AB (ref >60.00)
Glucose, Bld: 124 mg/dL — ABNORMAL HIGH (ref 70–99)
POTASSIUM: 4.1 meq/L (ref 3.5–5.1)
Sodium: 140 mEq/L (ref 135–145)
Total Bilirubin: 0.5 mg/dL (ref 0.2–1.2)
Total Protein: 6.5 g/dL (ref 6.0–8.3)

## 2016-09-27 ENCOUNTER — Other Ambulatory Visit: Payer: Self-pay | Admitting: *Deleted

## 2016-09-27 ENCOUNTER — Telehealth: Payer: Self-pay | Admitting: Family Medicine

## 2016-09-27 NOTE — Telephone Encounter (Signed)
Patient returned Shapale's call. °

## 2016-09-27 NOTE — Telephone Encounter (Signed)
Addressed through result notes  

## 2017-04-07 ENCOUNTER — Telehealth: Payer: Self-pay | Admitting: Family Medicine

## 2017-04-07 DIAGNOSIS — R739 Hyperglycemia, unspecified: Secondary | ICD-10-CM

## 2017-04-07 DIAGNOSIS — E7800 Pure hypercholesterolemia, unspecified: Secondary | ICD-10-CM

## 2017-04-07 DIAGNOSIS — E559 Vitamin D deficiency, unspecified: Secondary | ICD-10-CM

## 2017-04-07 DIAGNOSIS — K760 Fatty (change of) liver, not elsewhere classified: Secondary | ICD-10-CM

## 2017-04-07 NOTE — Telephone Encounter (Signed)
-----   Message from Ellamae Sia sent at 04/05/2017  2:23 PM EDT ----- Regarding: Lab orders for Thursday, 5.24.18 Patient is scheduled for CPX labs, please order future labs, Thanks , Karna Christmas

## 2017-04-14 ENCOUNTER — Ambulatory Visit: Payer: Medicare Other

## 2017-04-14 ENCOUNTER — Other Ambulatory Visit: Payer: Medicare Other

## 2017-04-15 ENCOUNTER — Ambulatory Visit: Payer: Medicare Other

## 2017-04-19 ENCOUNTER — Ambulatory Visit (INDEPENDENT_AMBULATORY_CARE_PROVIDER_SITE_OTHER): Payer: Medicare Other

## 2017-04-19 VITALS — BP 114/70 | HR 83 | Temp 97.7°F | Ht 60.5 in | Wt 140.8 lb

## 2017-04-19 DIAGNOSIS — E559 Vitamin D deficiency, unspecified: Secondary | ICD-10-CM

## 2017-04-19 DIAGNOSIS — R739 Hyperglycemia, unspecified: Secondary | ICD-10-CM | POA: Diagnosis not present

## 2017-04-19 DIAGNOSIS — K760 Fatty (change of) liver, not elsewhere classified: Secondary | ICD-10-CM | POA: Diagnosis not present

## 2017-04-19 DIAGNOSIS — E7800 Pure hypercholesterolemia, unspecified: Secondary | ICD-10-CM

## 2017-04-19 DIAGNOSIS — Z0000 Encounter for general adult medical examination without abnormal findings: Secondary | ICD-10-CM | POA: Diagnosis not present

## 2017-04-19 LAB — CBC WITH DIFFERENTIAL/PLATELET
Basophils Absolute: 0 10*3/uL (ref 0.0–0.1)
Basophils Relative: 0.5 % (ref 0.0–3.0)
EOS ABS: 0.1 10*3/uL (ref 0.0–0.7)
EOS PCT: 1.2 % (ref 0.0–5.0)
HCT: 42.2 % (ref 36.0–46.0)
HEMOGLOBIN: 13.9 g/dL (ref 12.0–15.0)
LYMPHS ABS: 2.5 10*3/uL (ref 0.7–4.0)
Lymphocytes Relative: 27.4 % (ref 12.0–46.0)
MCHC: 32.9 g/dL (ref 30.0–36.0)
MCV: 84.2 fl (ref 78.0–100.0)
MONO ABS: 0.7 10*3/uL (ref 0.1–1.0)
Monocytes Relative: 7.7 % (ref 3.0–12.0)
Neutro Abs: 5.6 10*3/uL (ref 1.4–7.7)
Neutrophils Relative %: 63.2 % (ref 43.0–77.0)
Platelets: 319 10*3/uL (ref 150.0–400.0)
RBC: 5.01 Mil/uL (ref 3.87–5.11)
RDW: 14.8 % (ref 11.5–15.5)
WBC: 8.9 10*3/uL (ref 4.0–10.5)

## 2017-04-19 LAB — HEMOGLOBIN A1C: Hgb A1c MFr Bld: 5.8 % (ref 4.6–6.5)

## 2017-04-19 LAB — COMPREHENSIVE METABOLIC PANEL
ALT: 37 U/L — ABNORMAL HIGH (ref 0–35)
AST: 21 U/L (ref 0–37)
Albumin: 4.5 g/dL (ref 3.5–5.2)
Alkaline Phosphatase: 55 U/L (ref 39–117)
BUN: 13 mg/dL (ref 6–23)
CHLORIDE: 103 meq/L (ref 96–112)
CO2: 30 mEq/L (ref 19–32)
Calcium: 9.7 mg/dL (ref 8.4–10.5)
Creatinine, Ser: 1.01 mg/dL (ref 0.40–1.20)
GFR: 57.12 mL/min — ABNORMAL LOW (ref >60.00)
Glucose, Bld: 104 mg/dL — ABNORMAL HIGH (ref 70–99)
POTASSIUM: 4.7 meq/L (ref 3.5–5.1)
SODIUM: 140 meq/L (ref 135–145)
TOTAL PROTEIN: 6.8 g/dL (ref 6.0–8.3)
Total Bilirubin: 0.5 mg/dL (ref 0.2–1.2)

## 2017-04-19 LAB — LIPID PANEL
CHOL/HDL RATIO: 5
Cholesterol: 244 mg/dL — ABNORMAL HIGH (ref 0–200)
HDL: 50.5 mg/dL (ref >39.00)
LDL Cholesterol: 166 mg/dL — ABNORMAL HIGH (ref 0–99)
NONHDL: 193.57
Triglycerides: 139 mg/dL (ref 0.0–149.0)
VLDL: 27.8 mg/dL (ref 0.0–40.0)

## 2017-04-19 LAB — TSH: TSH: 2.7 u[IU]/mL (ref 0.35–4.50)

## 2017-04-19 LAB — VITAMIN D 25 HYDROXY (VIT D DEFICIENCY, FRACTURES): VITD: 28.14 ng/mL — ABNORMAL LOW (ref 30.00–100.00)

## 2017-04-19 NOTE — Progress Notes (Signed)
Pre visit review using our clinic review tool, if applicable. No additional management support is needed unless otherwise documented below in the visit note. 

## 2017-04-19 NOTE — Progress Notes (Signed)
PCP notes:   Health maintenance:  No gaps identified.   Abnormal screenings:   Hearing - failed  Patient concerns:   None  Nurse concerns:  None  Next PCP appt:   04/20/17 @ 1730  I reviewed health advisor's note, was available for consultation, and agree with documentation and plan. Loura Pardon MD

## 2017-04-19 NOTE — Progress Notes (Signed)
Subjective:   Brianna [name] is a 71 y.o. female who presents for Medicare Annual (Subsequent) preventive examination.  Review of Systems:  N/A Cardiac Risk Factors include: advanced age (>8men, >57 women);dyslipidemia     Objective:     Vitals: BP 114/70 (BP Location: Left Arm, Patient Position: Sitting, Cuff Size: Normal)   Pulse 83   Temp 97.7 F (36.5 C) (Oral)   Ht 5' 0.5" (1.537 m) Comment: no shoes  Wt 140 lb 12 oz (63.8 kg)   SpO2 96%   BMI 27.04 kg/m   Body mass index is 27.04 kg/m.   Tobacco History  Smoking Status  . Never Smoker  Smokeless Tobacco  . Never Used     Counseling given: No   Past Medical History:  Diagnosis Date  . Allergic rhinitis   . Breast carcinoma ([place]) 10/11   right- eventual double mastectomy in 2012  . GERD (gastroesophageal reflux disease)   . Hyperlipidemia   . Osteoporosis    fosamax started 9/08  . Shingles   . Vitamin D deficiency    Past Surgical History:  Procedure Laterality Date  . BREAST BIOPSY  10/11   invasive and in situ mam carcinoma  . BREAST LUMPECTOMY  11/11  . CATARACT EXTRACTION     does not remember when  . MASTECTOMY  1/12   double- Dr. Donne Hazel  . MASTECTOMY, RADICAL Bilateral 11/2010  . PARTIAL HYSTERECTOMY  1980's   non cancer, ovaries intact  . WRIST FRACTURE SURGERY Left    in the 1990's   Family History  Problem Relation Age of Onset  . Dementia Mother   . Cancer Father        esophageal  . Diabetes Brother   . Cancer Paternal Aunt        ovarian   History  Sexual Activity  . Sexual activity: No    Outpatient Encounter Prescriptions as of 04/19/2017  Medication Sig  . Calcium Carbonate-Vitamin D (CALCIUM-VITAMIN D) 600-200 MG-UNIT CAPS Take 2 by mouth daily   . fexofenadine (ALLEGRA) 180 MG tablet Take otc as directed  . Multiple Vitamin (MULTIVITAMIN) tablet Take 1 tablet by mouth daily.    Marland Kitchen omeprazole (PRILOSEC) 20 MG capsule TAKE 1 CAPSULE (20 MG TOTAL) BY MOUTH DAILY.    . hydrochlorothiazide (MICROZIDE) 12.5 MG capsule Take 1 capsule (12.5 mg total) by mouth daily. (Patient not taking: Reported on 04/19/2017)  . [DISCONTINUED] Coenzyme Q10 (CO Q-10) 200 MG CAPS Take by mouth 2 (two) times daily.     No facility-administered encounter medications on file as of 04/19/2017.     Activities of Daily Living In your present state of health, do you have any difficulty performing the following activities: 04/19/2017  Hearing? Y  Vision? N  Difficulty concentrating or making decisions? N  Walking or climbing stairs? N  Dressing or bathing? N  Doing errands, shopping? N  Preparing Food and eating ? N  Using the Toilet? N  In the past six months, have you accidently leaked urine? Y  Do you have problems with loss of bowel control? N  Managing your Medications? N  Managing your Finances? N  Housekeeping or managing your Housekeeping? N  Some recent data might be hidden    Patient Care Team: Tower, Wynelle Fanny, MD as PCP - General Marica Otter, OD as Consulting Physician (Optometry)    Assessment:     Hearing Screening   125Hz  250Hz  500Hz  1000Hz  2000Hz  3000Hz  4000Hz  6000Hz  8000Hz   Right ear:   0 0 40  40    Left ear:   0 40 40  40      Visual Acuity Screening   Right eye Left eye Both eyes  Without correction: 20/50 20/30 20/40-1  With correction:       Exercise Activities and Dietary recommendations Current Exercise Habits: The patient has a physically strenous job, but has no regular exercise apart from work. (per pt, she walks 3 miles daily during course of job), Exercise limited by: None identified  Goals    . memory          Starting 04/19/2017, I will continue to volunteer at my church, increase my reading time, and continue working part-time to improve my memory.      Fall Risk Fall Risk  04/19/2017 03/23/2016 03/21/2015 03/19/2014 11/06/2012  Falls in the past year? No No No No No   Depression Screen PHQ 2/9 Scores 04/19/2017 03/23/2016  10/12/2015 07/08/2015  PHQ - 2 Score 0 0 0 0     Cognitive Function MMSE - Mini Mental State Exam 04/19/2017 03/23/2016  Orientation to time 5 5  Orientation to Place 5 5  Registration 3 3  Attention/ Calculation 0 0  Recall 3 2  Language- name 2 objects 0 0  Language- repeat 1 1  Language- follow 3 step command 3 3  Language- read & follow direction 0 0  Write a sentence 0 0  Copy design 0 0  Total score 20 19     PLEASE NOTE: A Mini-Cog screen was completed. Maximum score is 20. A value of 0 denotes this part of [place] MMSE was not completed or the patient failed this part of the Mini-Cog screening.   Mini-Cog Screening Orientation to Time - Max 5 pts Orientation to Place - Max 5 pts Registration - Max 3 pts Recall - Max 3 pts Language Repeat - Max 1 pts Language Follow 3 Step Command - Max 3 pts     Immunization History  Administered Date(s) Administered  . Influenza Split 11/05/2011, 11/06/2012  . Influenza Whole 08/22/2008, 10/13/2009, 09/04/2010  . Influenza,inj,Quad PF,36+ Mos 08/29/2013, 10/06/2015, 08/13/2016  . PPD Test 08/29/2013  . Pneumococcal Conjugate-13 03/19/2014  . Pneumococcal Polysaccharide-23 10/21/2010  . Td 03/04/1999, 10/13/2009   Screening Tests Health Maintenance  Topic Date Due  . INFLUENZA VACCINE  06/22/2017  . TETANUS/TDAP  10/14/2019  . COLONOSCOPY  12/09/2024  . DEXA SCAN  Completed  . Hepatitis C Screening  Completed  . PNA vac Low Risk Adult  Completed      Plan:     I have personally reviewed and addressed the Medicare Annual Wellness questionnaire and have noted the following in the patient's chart:  A. Medical and social history B. Use of alcohol, tobacco or illicit drugs  C. Current medications and supplements D. Functional ability and status E.  Nutritional status F.  Physical activity G. Advance directives H. List of other physicians I.  Hospitalizations, surgeries, and ER visits in previous 12 months J.   [place] to include hearing, vision, cognitive, depression L. Referrals and appointments - none  In addition, I have reviewed and discussed with patient certain preventive protocols, quality metrics, and best practice recommendations. A written personalized care plan for preventive services as well as general preventive health recommendations were provided to patient.  See attached scanned questionnaire for additional information.   Signed,   Lindell Noe, MHA, BS, LPN Health Coach

## 2017-04-19 NOTE — Patient Instructions (Signed)
Brianna [name] , Thank you for taking time to come for your Medicare Wellness Visit. I appreciate your ongoing commitment to your health goals. Please review the following plan we discussed and let me know if I can assist you in the future.   These are the goals we discussed: Goals    . memory          Starting 04/19/2017, I will continue to volunteer at my church, increase my reading time, and continue working part-time to improve my memory.       This is a list of the screening recommended for you and due dates:  Health Maintenance  Topic Date Due  . Flu Shot  06/22/2017  . Tetanus Vaccine  10/14/2019  . Colon Cancer Screening  12/09/2024  . DEXA scan (bone density measurement)  Completed  .  Hepatitis C: One time screening is recommended by Center for Disease Control  (CDC) for  adults born from [birth year] through 1967.   Completed  . Pneumonia vaccines  Completed   Preventive Care for Adults  A healthy lifestyle and preventive care can promote health and wellness. Preventive health guidelines for adults include the following key practices.  . A routine yearly physical is a good way to check with your health care provider about your health and preventive screening. It is a chance to share any concerns and updates on your health and to receive a thorough exam.  . Visit your dentist for a routine exam and preventive care every 6 months. Brush your teeth twice a day and floss once a day. Good oral hygiene prevents tooth decay and gum disease.  . The frequency of eye exams is based on your age, health, family medical history, use  of contact lenses, and other factors. Follow your health care provider's ecommendations for frequency of eye exams.  . Eat a healthy diet. Foods like vegetables, fruits, whole grains, low-fat dairy products, and lean protein foods contain the nutrients you need without too many calories. Decrease your intake of foods high in solid fats, added sugars, and salt. Eat  the right amount of calories for you. Get information about a proper diet from your health care provider, if necessary.  . Regular physical exercise is one of the most important things you can do for your health. Most adults should get at least 150 minutes of moderate-intensity exercise (any activity that increases your heart rate and causes you to sweat) each week. In addition, most adults need muscle-strengthening exercises on 2 or more days a week.  Silver Sneakers may be a benefit available to you. To determine eligibility, you may visit the website: www.silversneakers.com or contact program at 505-510-5284 Mon-Fri between 8AM-8PM.   . Maintain a healthy weight. The body mass index (BMI) is a screening tool to identify possible weight problems. It provides an estimate of body fat based on height and weight. Your health care provider can find your BMI and can help you achieve or maintain a healthy weight.   For adults 20 years and older: ? A BMI below 18.5 is considered underweight. ? A BMI of 18.5 to 24.9 is normal. ? A BMI of 25 to 29.9 is considered overweight. ? A BMI of 30 and above is considered obese.   . Maintain normal blood lipids and cholesterol levels by exercising and minimizing your intake of saturated fat. Eat a balanced diet with plenty of fruit and vegetables. Blood tests for lipids and cholesterol should begin at age 33 and  be repeated every 5 years. If your lipid or cholesterol levels are high, you are over 50, or you are at high risk for heart disease, you may need your cholesterol levels checked more frequently. Ongoing high lipid and cholesterol levels should be treated with medicines if diet and exercise are not working.  . If you smoke, find out from your health care provider how to quit. If you do not use tobacco, please do not start.  . If you choose to drink alcohol, please do not consume more than 2 drinks per day. One drink is considered to be 12 ounces (355 mL)  of beer, 5 ounces (148 mL) of wine, or 1.5 ounces (44 mL) of liquor.  . If you are 51-50 years old, ask your health care provider if you should take aspirin to prevent strokes.  . Use sunscreen. Apply sunscreen liberally and repeatedly throughout the day. You should seek shade when your shadow is shorter than you. Protect yourself by wearing long sleeves, pants, a wide-brimmed hat, and sunglasses year round, whenever you are outdoors.  . Once a month, do a whole body skin exam, using a mirror to look at the skin on your back. Tell your health care provider of new moles, moles that have irregular borders, moles that are larger than a pencil eraser, or moles that have changed in shape or color.

## 2017-04-20 ENCOUNTER — Ambulatory Visit (INDEPENDENT_AMBULATORY_CARE_PROVIDER_SITE_OTHER): Payer: Medicare Other | Admitting: Family Medicine

## 2017-04-20 ENCOUNTER — Encounter: Payer: Self-pay | Admitting: Family Medicine

## 2017-04-20 VITALS — BP 118/72 | HR 81 | Temp 97.9°F | Ht 60.5 in | Wt 140.8 lb

## 2017-04-20 DIAGNOSIS — M8580 Other specified disorders of bone density and structure, unspecified site: Secondary | ICD-10-CM

## 2017-04-20 DIAGNOSIS — C50911 Malignant neoplasm of unspecified site of right female breast: Secondary | ICD-10-CM | POA: Diagnosis not present

## 2017-04-20 DIAGNOSIS — I872 Venous insufficiency (chronic) (peripheral): Secondary | ICD-10-CM

## 2017-04-20 DIAGNOSIS — E559 Vitamin D deficiency, unspecified: Secondary | ICD-10-CM | POA: Diagnosis not present

## 2017-04-20 DIAGNOSIS — R739 Hyperglycemia, unspecified: Secondary | ICD-10-CM

## 2017-04-20 DIAGNOSIS — E7800 Pure hypercholesterolemia, unspecified: Secondary | ICD-10-CM

## 2017-04-20 MED ORDER — ERGOCALCIFEROL 1.25 MG (50000 UT) PO CAPS: 50000.0000 [IU] | ORAL_CAPSULE | ORAL | 0 refills | Status: DC

## 2017-04-20 MED ORDER — OMEPRAZOLE 20 MG PO CPDR: DELAYED_RELEASE_CAPSULE | ORAL | 3 refills | Status: DC

## 2017-04-20 NOTE — Progress Notes (Addendum)
Subjective:    Patient ID: Brianna [name], female    DOB: [birth year], 71 y.o.   MRN: [id number]  HPI Here for annual f/u of chronic medical problems   Feeling pretty good overall   She has a stinging area on L calf that comes and goes  No skin changes  Wears support stockings on and off   She used hct for short time and it helped swelling -does not need it now  She does occ eat too much salt   BP Readings from Last 3 Encounters:  04/20/17 118/72  04/19/17 114/70  09/10/16 126/68     Had amw visit 5/29 Missed low tones in R ear with hearing screen She notices some hearing issues in noise/crowds - strains to hear a bit  Wants to hold off on further evaluation  No health gaps noted   Wt Readings from Last 3 Encounters:  04/20/17 140 lb 12.8 oz (63.9 kg)  04/19/17 140 lb 12 oz (63.8 kg)  09/10/16 145 lb 8 oz (66 kg)  lost 5 lb from the fall  She is walking more for exercise  Tries to eat healthy  bmi 27.0  Personal hx of breast cancer with double mastectomy  No mammograms  No issues with this/no f/u needed  No discomfort or skin changes  No current cancer  No current cancer treatment   Takes aleve occ for back pain   Colonoscopy 1/16 nl with 10 year recall   dexa 6/16-stable osteopenia  Would like to wait another year for the next one  No falls  No fractures in the past year  D level is 28.1-she is still hit and miss for vitamin D   Zoster status - had the vaccine  She is going to inquire about the new vaccine if covered   Hx of hyperglycemia Lab Results  Component Value Date   HGBA1C 5.8 04/19/2017  she watches diet some of the time   Hx of hyperlipidemia Lab Results  Component Value Date   CHOL 244 (H) 04/19/2017   CHOL 204 (H) 03/16/2016   CHOL 174 03/14/2015   Lab Results  Component Value Date   HDL 50.50 04/19/2017   HDL 47.30 03/16/2016   HDL 43.60 03/14/2015   Lab Results  Component Value Date   LDLCALC 166 (H) 04/19/2017   LDLCALC  119 (H) 03/16/2016   LDLCALC 107 (H) 03/14/2015   Lab Results  Component Value Date   TRIG 139.0 04/19/2017   TRIG 189.0 (H) 03/16/2016   TRIG 117.0 03/14/2015   Lab Results  Component Value Date   CHOLHDL 5 04/19/2017   CHOLHDL 4 03/16/2016   CHOLHDL 4 03/14/2015   Lab Results  Component Value Date   LDLDIRECT 119.9 10/30/2012   LDLDIRECT 136.2 11/02/2011   LDLDIRECT 185.7 08/03/2011   prev on pravastatin - myalgias  Not open to trying another statin   Results for orders placed or performed in visit on 04/19/17  CBC with Differential/Platelet  Result Value Ref Range   WBC 8.9 4.0 - 10.5 K/uL   RBC 5.01 3.87 - 5.11 Mil/uL   Hemoglobin 13.9 12.0 - 15.0 g/dL   HCT 42.2 36.0 - 46.0 %   MCV 84.2 78.0 - 100.0 fl   MCHC 32.9 30.0 - 36.0 g/dL   RDW 14.8 11.5 - 15.5 %   Platelets 319.0 150.0 - 400.0 K/uL   Neutrophils Relative % 63.2 43.0 - 77.0 %   Lymphocytes Relative 27.4 12.0 -  46.0 %   Monocytes Relative 7.7 3.0 - 12.0 %   Eosinophils Relative 1.2 0.0 - 5.0 %   Basophils Relative 0.5 0.0 - 3.0 %   Neutro Abs 5.6 1.4 - 7.7 K/uL   Lymphs Abs 2.5 0.7 - 4.0 K/uL   Monocytes Absolute 0.7 0.1 - 1.0 K/uL   Eosinophils Absolute 0.1 0.0 - 0.7 K/uL   Basophils Absolute 0.0 0.0 - 0.1 K/uL  Comprehensive metabolic panel  Result Value Ref Range   Sodium 140 135 - 145 mEq/L   Potassium 4.7 3.5 - 5.1 mEq/L   Chloride 103 96 - 112 mEq/L   CO2 30 19 - 32 mEq/L   Glucose, Bld 104 (H) 70 - 99 mg/dL   BUN 13 6 - 23 mg/dL   Creatinine, Ser 1.01 0.40 - 1.20 mg/dL   Total Bilirubin 0.5 0.2 - 1.2 mg/dL   Alkaline Phosphatase 55 39 - 117 U/L   AST 21 0 - 37 U/L   ALT 37 (H) 0 - 35 U/L   Total Protein 6.8 6.0 - 8.3 g/dL   Albumin 4.5 3.5 - 5.2 g/dL   Calcium 9.7 8.4 - 10.5 mg/dL   GFR 57.12 (L) >60.00 mL/min  Hemoglobin A1c  Result Value Ref Range   Hgb A1c MFr Bld 5.8 4.6 - 6.5 %  Lipid panel  Result Value Ref Range   Cholesterol 244 (H) 0 - 200 mg/dL   Triglycerides 139.0 0.0  - 149.0 mg/dL   HDL 50.50 >39.00 mg/dL   VLDL 27.8 0.0 - 40.0 mg/dL   LDL Cholesterol 166 (H) 0 - 99 mg/dL   Total CHOL/HDL Ratio 5    NonHDL 193.57   TSH  Result Value Ref Range   TSH 2.70 0.35 - 4.50 uIU/mL  VITAMIN D 25 Hydroxy (Vit-D Deficiency, Fractures)  Result Value Ref Range   VITD 28.14 (L) 30.00 - 100.00 ng/mL     Patient Active Problem List   Diagnosis Date Noted  . Pre-employment examination 08/29/2013    Priority: Medium  . Thumb pain, left 01/30/2019  . Plantar fasciitis 06/19/2018  . Low back pain 06/19/2018  . Abnormal urinalysis 06/19/2018  . Routine general medical examination at a health care facility 05/10/2018  . Chronic venous insufficiency 09/10/2016  . Pedal edema 08/13/2016  . Dyspepsia 04/12/2016  . Prediabetes 03/21/2015  . Nocturnal leg cramps 03/21/2015  . Estrogen deficiency 03/21/2015  . History of breast cancer 12/26/2014  . Encounter for Medicare annual wellness exam 02/28/2014  . Vitamin D deficiency 10/11/2008  . Hyperlipidemia 07/07/2007  . GERD 07/07/2007  . Osteoporosis 07/07/2007  . Fatty liver 07/07/2007   Past Medical History:  Diagnosis Date  . Allergic rhinitis   . Breast carcinoma ([place]) 10/11   right- eventual double mastectomy in 2012  . GERD (gastroesophageal reflux disease)   . Hyperlipidemia   . Osteoporosis    fosamax started 9/08  . Shingles   . Vitamin D deficiency    Past Surgical History:  Procedure Laterality Date  . BREAST BIOPSY  10/11   invasive and in situ mam carcinoma  . BREAST LUMPECTOMY  11/11  . CATARACT EXTRACTION     does not remember when  . MASTECTOMY  1/12   double- Dr. Donne Hazel  . MASTECTOMY, RADICAL Bilateral 11/2010  . PARTIAL HYSTERECTOMY  1980's   non cancer, ovaries intact  . WRIST FRACTURE SURGERY Left    in the 1990's   Social History   Tobacco Use  .  Smoking status: Never Smoker  . Smokeless tobacco: Never Used  Substance Use Topics  . Alcohol use: Yes    Alcohol/week:  0.0 standard drinks    Comment: wine rarely  . Drug use: No   Family History  Problem Relation Age of Onset  . Dementia Mother   . Cancer Father        esophageal  . Diabetes Brother   . Cancer Paternal Aunt        ovarian   Allergies  Allergen Reactions  . Pravachol     Myalgias  (As of visit on 05/17/2013, patient is taking Pravachol, but continues to have muscle pain)  . Zetia [Ezetimibe] Other (See Comments)    Ineffective, elevated liver enzymes   Current Outpatient Medications on File Prior to Visit  Medication Sig Dispense Refill  . Calcium Carbonate-Vitamin D (CALCIUM-VITAMIN D) 600-200 MG-UNIT CAPS Take 2 by mouth daily     . fexofenadine (ALLEGRA) 180 MG tablet Take otc as directed    . Multiple Vitamin (MULTIVITAMIN) tablet Take 1 tablet by mouth daily.       No current facility-administered medications on file prior to visit.    Review of Systems Review of Systems  Constitutional: Negative for fever, appetite change, fatigue and unexpected weight change.  Eyes: Negative for pain and visual disturbance.  Respiratory: Negative for cough and shortness of breath.   Cardiovascular: Negative for cp or palpitations   pos for varicose veins in legs with occ pain or edema  Gastrointestinal: Negative for nausea, diarrhea and constipation.  Genitourinary: Negative for urgency and frequency.  Skin: Negative for pallor or rash   Neurological: Negative for weakness, light-headedness, numbness and headaches.  Hematological: Negative for adenopathy. Does not bruise/bleed easily.  Psychiatric/Behavioral: Negative for dysphoric mood. The patient is not nervous/anxious.         Objective:   Physical Exam  Constitutional: She appears well-developed and well-nourished. No distress.  overwt and well appearing   HENT:  Head: Normocephalic and atraumatic.  Right Ear: External ear normal.  Left Ear: External ear normal.  Nose: Nose normal.  Mouth/Throat: Oropharynx is clear and  moist.  Eyes: Conjunctivae and EOM are normal. Pupils are equal, round, and reactive to light. Right eye exhibits no discharge. Left eye exhibits no discharge. No scleral icterus.  Neck: Normal range of motion. Neck supple. No JVD present. Carotid bruit is not present. No thyromegaly present.  Cardiovascular: Normal rate, regular rhythm, normal heart sounds and intact distal pulses.  Exam reveals no gallop.   Varicosities below knee  (notably L calf)-compressible and nt today  Pulmonary/Chest: Effort normal and breath sounds normal. No respiratory distress. She has no wheezes. She has no rales.  Abdominal: Soft. Bowel sounds are normal. She exhibits no distension and no mass. There is no tenderness.  Genitourinary:  Genitourinary Comments: S/p mastectomy  Musculoskeletal: She exhibits no edema or tenderness.  No kyphosis   Lymphadenopathy:    She has no cervical adenopathy.  Neurological: She is alert. She has normal reflexes. No cranial nerve deficit. She exhibits normal muscle tone. Coordination normal.  Skin: Skin is warm and dry. No rash noted. No erythema. No pallor.  Solar lentigines diffusely    Psychiatric: She has a normal mood and affect.          Assessment & Plan:   Problem List Items Addressed This Visit      Cardiovascular and Mediastinum   Chronic venous insufficiency    Enc support  hose Some occ pain L calf - burning sens over varicosity        Musculoskeletal and Integument   Osteoporosis    Pt wants to wait another year for dexa  No falls or fx Disc need for calcium/ vitamin D/ wt bearing exercise and bone density test every 2 y to monitor Disc safety/ fracture risk in detail   Will supplement D        Other   Vitamin D deficiency    Px 12 wk of weekly high dose ergocalciferol  Also 2000 iu daily - ongoing  Re check in a year Disc imp to bone and overall health      Hyperlipidemia - Primary    Disc goals for lipids and reasons to control  them Rev labs with pt Rev low sat fat diet in detail  Intol of pravastatin  LDL is up  Declines other statins or tx      Prediabetes    Lab Results  Component Value Date   HGBA1C 5.8 04/19/2017   This is stable  disc imp of low glycemic diet and wt loss to prevent DM2

## 2017-04-20 NOTE — Patient Instructions (Addendum)
Take your vitamin D daily  For 12 weeks also take the high dose weekly vitamin D to jump start your level    If you are interested in the new shingles vaccine (Shingrix) - call your insurance to check on coverage,( you should not get it within 1 month of other vaccines) , then call us for a prescription  for it to take to a pharmacy that gives the shot , or make a nurse visit to get it here depending on your coverage   If your vein in your leg bothers you- try support stocking for a while and elevate it   Take a look at the back exercises Stretching and yoga help also   Make sure to eat a healthy diet -get your carbs from produce  For cholesterol   Avoid red meat/ fried foods/ egg yolks/ fatty breakfast meats/ butter, cheese and high fat dairy/ and shellfish    Keep walking

## 2017-04-21 NOTE — Assessment & Plan Note (Signed)
Lab Results  Component Value Date   HGBA1C 5.8 04/19/2017   This is stable  disc imp of low glycemic diet and wt loss to prevent DM2

## 2017-04-21 NOTE — Assessment & Plan Note (Signed)
Px 12 wk of weekly high dose ergocalciferol  Also 2000 iu daily - ongoing  Re check in a year Disc imp to bone and overall health

## 2017-04-21 NOTE — Assessment & Plan Note (Signed)
S/p mastectomy  No mammograms or further f/u

## 2017-04-21 NOTE — Assessment & Plan Note (Signed)
Pt wants to wait another year for dexa  No falls or fx Disc need for calcium/ vitamin D/ wt bearing exercise and bone density test every 2 y to monitor Disc safety/ fracture risk in detail   Will supplement D

## 2017-04-21 NOTE — Assessment & Plan Note (Signed)
Enc support hose Some occ pain L calf - burning sens over varicosity

## 2017-04-21 NOTE — Assessment & Plan Note (Signed)
Disc goals for lipids and reasons to control them Rev labs with pt Rev low sat fat diet in detail  Intol of pravastatin  LDL is up  Declines other statins or tx

## 2017-05-10 DIAGNOSIS — H353111 Nonexudative age-related macular degeneration, right eye, early dry stage: Secondary | ICD-10-CM | POA: Diagnosis not present

## 2017-05-10 DIAGNOSIS — H353121 Nonexudative age-related macular degeneration, left eye, early dry stage: Secondary | ICD-10-CM | POA: Diagnosis not present

## 2017-05-10 DIAGNOSIS — H5202 Hypermetropia, left eye: Secondary | ICD-10-CM | POA: Diagnosis not present

## 2017-05-10 DIAGNOSIS — Z961 Presence of intraocular lens: Secondary | ICD-10-CM | POA: Diagnosis not present

## 2017-05-10 DIAGNOSIS — H43813 Vitreous degeneration, bilateral: Secondary | ICD-10-CM | POA: Diagnosis not present

## 2017-05-10 DIAGNOSIS — H52223 Regular astigmatism, bilateral: Secondary | ICD-10-CM | POA: Diagnosis not present

## 2018-01-25 NOTE — Progress Notes (Signed)
This encounter was created in error - please disregard.

## 2018-04-19 ENCOUNTER — Ambulatory Visit (INDEPENDENT_AMBULATORY_CARE_PROVIDER_SITE_OTHER): Payer: Medicare Other

## 2018-04-19 VITALS — BP 118/86 | HR 81 | Temp 98.1°F | Ht 62.0 in | Wt 156.8 lb

## 2018-04-19 DIAGNOSIS — E785 Hyperlipidemia, unspecified: Secondary | ICD-10-CM

## 2018-04-19 DIAGNOSIS — E559 Vitamin D deficiency, unspecified: Secondary | ICD-10-CM | POA: Diagnosis not present

## 2018-04-19 DIAGNOSIS — Z0000 Encounter for general adult medical examination without abnormal findings: Secondary | ICD-10-CM

## 2018-04-19 DIAGNOSIS — R739 Hyperglycemia, unspecified: Secondary | ICD-10-CM

## 2018-04-19 LAB — LIPID PANEL
CHOL/HDL RATIO: 6
Cholesterol: 239 mg/dL — ABNORMAL HIGH (ref 0–200)
HDL: 42 mg/dL (ref >39.00)
LDL CALC: 161 mg/dL — AB (ref 0–99)
NONHDL: 196.97
TRIGLYCERIDES: 182 mg/dL — AB (ref 0.0–149.0)
VLDL: 36.4 mg/dL (ref 0.0–40.0)

## 2018-04-19 LAB — COMPREHENSIVE METABOLIC PANEL
ALBUMIN: 3.9 g/dL (ref 3.5–5.2)
ALT: 49 U/L — ABNORMAL HIGH (ref 0–35)
AST: 25 U/L (ref 0–37)
Alkaline Phosphatase: 58 U/L (ref 39–117)
BUN: 13 mg/dL (ref 6–23)
CALCIUM: 9.1 mg/dL (ref 8.4–10.5)
CHLORIDE: 105 meq/L (ref 96–112)
CO2: 27 meq/L (ref 19–32)
Creatinine, Ser: 0.92 mg/dL (ref 0.40–1.20)
GFR: 63.44 mL/min (ref >60.00)
Glucose, Bld: 145 mg/dL — ABNORMAL HIGH (ref 70–99)
POTASSIUM: 4.6 meq/L (ref 3.5–5.1)
Sodium: 141 mEq/L (ref 135–145)
Total Bilirubin: 0.5 mg/dL (ref 0.2–1.2)
Total Protein: 6.4 g/dL (ref 6.0–8.3)

## 2018-04-19 LAB — CBC WITH DIFFERENTIAL/PLATELET
BASOS PCT: 0.5 % (ref 0.0–3.0)
Basophils Absolute: 0 10*3/uL (ref 0.0–0.1)
EOS ABS: 0.1 10*3/uL (ref 0.0–0.7)
EOS PCT: 1.6 % (ref 0.0–5.0)
HEMATOCRIT: 38.8 % (ref 36.0–46.0)
HEMOGLOBIN: 12.9 g/dL (ref 12.0–15.0)
LYMPHS PCT: 29.5 % (ref 12.0–46.0)
Lymphs Abs: 1.9 10*3/uL (ref 0.7–4.0)
MCHC: 33.3 g/dL (ref 30.0–36.0)
MCV: 84.3 fl (ref 78.0–100.0)
MONOS PCT: 10.5 % (ref 3.0–12.0)
Monocytes Absolute: 0.7 10*3/uL (ref 0.1–1.0)
Neutro Abs: 3.8 10*3/uL (ref 1.4–7.7)
Neutrophils Relative %: 57.9 % (ref 43.0–77.0)
Platelets: 239 10*3/uL (ref 150.0–400.0)
RBC: 4.6 Mil/uL (ref 3.87–5.11)
RDW: 14.7 % (ref 11.5–15.5)
WBC: 6.6 10*3/uL (ref 4.0–10.5)

## 2018-04-19 LAB — HEMOGLOBIN A1C: Hgb A1c MFr Bld: 6.3 % (ref 4.6–6.5)

## 2018-04-19 LAB — VITAMIN D 25 HYDROXY (VIT D DEFICIENCY, FRACTURES): VITD: 30.95 ng/mL (ref 30.00–100.00)

## 2018-04-19 LAB — TSH: TSH: 3.4 u[IU]/mL (ref 0.35–4.50)

## 2018-04-19 NOTE — Patient Instructions (Signed)
Brianna [name] , Thank you for taking time to come for your Medicare Wellness Visit. I appreciate your ongoing commitment to your health goals. Please review the following plan we discussed and let me know if I can assist you in the future.   These are the goals we discussed: Goals    . memory     Starting 04/19/2018, I will continue to volunteer at my church, increase my reading time, and continue working part-time to improve my memory.       This is a list of the screening recommended for you and due dates:  Health Maintenance  Topic Date Due  . Flu Shot  06/22/2018  . Tetanus Vaccine  10/14/2019  . Colon Cancer Screening  12/09/2024  . DEXA scan (bone density measurement)  Completed  .  Hepatitis C: One time screening is recommended by Center for Disease Control  (CDC) for  adults born from [birth year] through 1967.   Completed  . Pneumonia vaccines  Completed   Preventive Care for Adults  A healthy lifestyle and preventive care can promote health and wellness. Preventive health guidelines for adults include the following key practices.  . A routine yearly physical is a good way to check with your health care provider about your health and preventive screening. It is a chance to share any concerns and updates on your health and to receive a thorough exam.  . Visit your dentist for a routine exam and preventive care every 6 months. Brush your teeth twice a day and floss once a day. Good oral hygiene prevents tooth decay and gum disease.  . The frequency of eye exams is based on your age, health, family medical history, use  of contact lenses, and other factors. Follow your health care provider's recommendations for frequency of eye exams.  . Eat a healthy diet. Foods like vegetables, fruits, whole grains, low-fat dairy products, and lean protein foods contain the nutrients you need without too many calories. Decrease your intake of foods high in solid fats, added sugars, and salt. Eat the  right amount of calories for you. Get information about a proper diet from your health care provider, if necessary.  . Regular physical exercise is one of the most important things you can do for your health. Most adults should get at least 150 minutes of moderate-intensity exercise (any activity that increases your heart rate and causes you to sweat) each week. In addition, most adults need muscle-strengthening exercises on 2 or more days a week.  Silver Sneakers may be a benefit available to you. To determine eligibility, you may visit the website: www.silversneakers.com or contact program at 843-767-0162 Mon-Fri between 8AM-8PM.   . Maintain a healthy weight. The body mass index (BMI) is a screening tool to identify possible weight problems. It provides an estimate of body fat based on height and weight. Your health care provider can find your BMI and can help you achieve or maintain a healthy weight.   For adults 20 years and older: ? A BMI below 18.5 is considered underweight. ? A BMI of 18.5 to 24.9 is normal. ? A BMI of 25 to 29.9 is considered overweight. ? A BMI of 30 and above is considered obese.   . Maintain normal blood lipids and cholesterol levels by exercising and minimizing your intake of saturated fat. Eat a balanced diet with plenty of fruit and vegetables. Blood tests for lipids and cholesterol should begin at age 86 and be repeated every 5 years.  If your lipid or cholesterol levels are high, you are over 50, or you are at high risk for heart disease, you may need your cholesterol levels checked more frequently. Ongoing high lipid and cholesterol levels should be treated with medicines if diet and exercise are not working.  . If you smoke, find out from your health care provider how to quit. If you do not use tobacco, please do not start.  . If you choose to drink alcohol, please do not consume more than 2 drinks per day. One drink is considered to be 12 ounces (355 mL) of  beer, 5 ounces (148 mL) of wine, or 1.5 ounces (44 mL) of liquor.  . If you are 50-23 years old, ask your health care provider if you should take aspirin to prevent strokes.  . Use sunscreen. Apply sunscreen liberally and repeatedly throughout the day. You should seek shade when your shadow is shorter than you. Protect yourself by wearing long sleeves, pants, a wide-brimmed hat, and sunglasses year round, whenever you are outdoors.  . Once a month, do a whole body skin exam, using a mirror to look at the skin on your back. Tell your health care provider of new moles, moles that have irregular borders, moles that are larger than a pencil eraser, or moles that have changed in shape or color.

## 2018-04-19 NOTE — Progress Notes (Signed)
Subjective:   Brianna [name] is a 72 y.o. female who presents for Medicare Annual (Subsequent) preventive examination.  Review of Systems:  N/A Cardiac Risk Factors include: advanced age (>30men, >8 women)     Objective:     Vitals: BP 118/86 (BP Location: Left Arm, Patient Position: Sitting, Cuff Size: Normal)   Pulse 81   Temp 98.1 F (36.7 C) (Oral)   Ht 5\' 2"  (1.575 m) Comment: shoes  Wt 156 lb 12 oz (71.1 kg)   SpO2 99%   BMI 28.67 kg/m   Body mass index is 28.67 kg/m.  Advanced Directives 04/19/2018 04/19/2017 03/23/2016 12/30/2015 03/05/2015 12/26/2014  Does Patient Have a Medical Advance Directive? No No No No No No  Would patient like information on creating a medical advance directive? No - Patient declined - Yes Higher education careers adviser given - No - patient declined information -    Tobacco Social History   Tobacco Use  Smoking Status Never Smoker  Smokeless Tobacco Never Used     Counseling given: No   Clinical Intake:  Pre-visit preparation completed: Yes  Pain : 0-10 Pain Score: 2  Pain Type: Chronic pain Pain Location: Back Pain Orientation: Lower Pain Onset: More than a month ago Pain Frequency: Constant     Nutritional Status: BMI 25 -29 Overweight Nutritional Risks: None Diabetes: No  How often do you need to have someone help you when you read instructions, pamphlets, or other written materials from your doctor or pharmacy?: 1 - Never What is the last grade level you completed in school?: 12th grade  Interpreter Needed?: No  Comments: pt lives alone Information entered by :: LPinson, LPN  Past Medical History:  Diagnosis Date  . Allergic rhinitis   . Breast carcinoma ([place]) 10/11   right- eventual double mastectomy in 2012  . GERD (gastroesophageal reflux disease)   . Hyperlipidemia   . Osteoporosis    fosamax started 9/08  . Shingles   . Vitamin D deficiency    Past Surgical History:  Procedure Laterality Date  . BREAST  BIOPSY  10/11   invasive and in situ mam carcinoma  . BREAST LUMPECTOMY  11/11  . CATARACT EXTRACTION     does not remember when  . MASTECTOMY  1/12   double- Dr. Donne Hazel  . MASTECTOMY, RADICAL Bilateral 11/2010  . PARTIAL HYSTERECTOMY  1980's   non cancer, ovaries intact  . WRIST FRACTURE SURGERY Left    in the 1990's   Family History  Problem Relation Age of Onset  . Dementia Mother   . Cancer Father        esophageal  . Diabetes Brother   . Cancer Paternal Aunt        ovarian   Social History   Socioeconomic History  . Marital status: Single    Spouse name: n/a  . Number of children: 1  . Years of education: Not on file  . Highest education level: Not on file  Occupational History  . Occupation: retired    Fish farm manager: [employer]  Social Needs  . Financial resource strain: Not on file  . Food insecurity:    Worry: Not on file    Inability: Not on file  . Transportation needs:    Medical: Not on file    Non-medical: Not on file  Tobacco Use  . Smoking status: Never Smoker  . Smokeless tobacco: Never Used  Substance and Sexual Activity  . Alcohol use: Yes  Alcohol/week: 0.0 oz    Comment: wine rarely  . Drug use: No  . Sexual activity: Not Currently  Lifestyle  . Physical activity:    Days per week: Not on file    Minutes per session: Not on file  . Stress: Not on file  Relationships  . Social connections:    Talks on phone: Not on file    Gets together: Not on file    Attends religious service: Not on file    Active member of club or organization: Not on file    Attends meetings of clubs or organizations: Not on file    Relationship status: Not on file  Other Topics Concern  . Not on file  Social History Narrative  . Not on file    Outpatient Encounter Medications as of 04/19/2018  Medication Sig  . Calcium Carbonate-Vitamin D (CALCIUM-VITAMIN D) 600-200 MG-UNIT CAPS Take 2 by mouth daily   . ergocalciferol (VITAMIN D2) 50000 units  capsule Take 1 capsule (50,000 Units total) by mouth once a week.  . fexofenadine (ALLEGRA) 180 MG tablet Take otc as directed  . Multiple Vitamin (MULTIVITAMIN) tablet Take 1 tablet by mouth daily.    Marland Kitchen omeprazole (PRILOSEC) 20 MG capsule TAKE 1 CAPSULE (20 MG TOTAL) BY MOUTH DAILY.  . hydrochlorothiazide (MICROZIDE) 12.5 MG capsule Take 1 capsule (12.5 mg total) by mouth daily. (Patient not taking: Reported on 04/19/2018)   No facility-administered encounter medications on file as of 04/19/2018.     Activities of Daily Living In your present state of health, do you have any difficulty performing the following activities: 04/19/2018 04/19/2017  Hearing? N Y  Comment - per pt, she has concerns with soft or whispered voices  Vision? N N  Comment - per pt, eyes are very dry  Difficulty concentrating or making decisions? N N  Walking or climbing stairs? N N  Dressing or bathing? N N  Doing errands, shopping? N N  Preparing Food and eating ? N N  Using the Toilet? N N  In the past six months, have you accidently leaked urine? Tempie Donning  Comment wears pad daily per pt, wears pad daily  Do you have problems with loss of bowel control? N N  Managing your Medications? N N  Managing your Finances? N N  Housekeeping or managing your Housekeeping? N N  Some recent data might be hidden    Patient Care Team: Tower, Wynelle Fanny, MD as PCP - General Marica Otter, OD as Consulting Physician (Optometry)    Assessment:   This is a routine wellness examination for [place].   Hearing Screening   125Hz  250Hz  500Hz  1000Hz  2000Hz  3000Hz  4000Hz  6000Hz  8000Hz   Right ear:   40 40 40  40    Left ear:   40 40 40  40    Vision Screening Comments: Summer 2018 with Dr. Marica Otter  Exercise Activities and Dietary recommendations Current Exercise Habits: The patient does not participate in regular exercise at present, Exercise limited by: None identified  Goals    . memory     Starting 04/19/2018, I will continue  to volunteer at my church, increase my reading time, and continue working part-time to improve my memory.       Fall Risk Fall Risk  04/19/2018 04/19/2017 03/23/2016 03/21/2015 03/19/2014  Falls in the past year? Yes No No No No  Comment 2 falls due to loss of balance while doing yard work - - - -  Number  falls in past yr: 2 or more - - - -  Injury with Fall? No - - - -   Depression Screen PHQ 2/9 Scores 04/19/2018 04/19/2017 03/23/2016 10/12/2015  PHQ - 2 Score 0 0 0 0  PHQ- 9 Score 0 - - -     Cognitive Function MMSE - Mini Mental State Exam 04/19/2018 04/19/2017 03/23/2016  Orientation to time 5 5 5   Orientation to Place 5 5 5   Registration 3 3 3   Attention/ Calculation 0 0 0  Recall 3 3 2   Language- name 2 objects 0 0 0  Language- repeat 1 1 1   Language- follow 3 step command 3 3 3   Language- read & follow direction 0 0 0  Write a sentence 0 0 0  Copy design 0 0 0  Total score 20 20 19      PLEASE NOTE: A Mini-Cog screen was completed. Maximum score is 20. A value of 0 denotes this part of [place] MMSE was not completed or the patient failed this part of the Mini-Cog screening.   Mini-Cog Screening Orientation to Time - Max 5 pts Orientation to Place - Max 5 pts Registration - Max 3 pts Recall - Max 3 pts Language Repeat - Max 1 pts Language Follow 3 Step Command - Max 3 pts     Immunization History  Administered Date(s) Administered  . Influenza Split 11/05/2011, 11/06/2012  . Influenza Whole 08/22/2008, 10/13/2009, 09/04/2010  . Influenza,inj,Quad PF,6+ Mos 08/29/2013, 10/06/2015, 08/13/2016  . PPD Test 08/29/2013  . Pneumococcal Conjugate-13 03/19/2014  . Pneumococcal Polysaccharide-23 10/21/2010  . Td 03/04/1999, 10/13/2009    Screening Tests Health Maintenance  Topic Date Due  . INFLUENZA VACCINE  06/22/2018  . TETANUS/TDAP  10/14/2019  . COLONOSCOPY  12/09/2024  . DEXA SCAN  Completed  . Hepatitis C Screening  Completed  . PNA vac Low Risk Adult  Completed         Plan:     I have personally reviewed, addressed, and noted the following in the patient's chart:  A. Medical and social history B. Use of alcohol, tobacco or illicit drugs  C. Current medications and supplements D. Functional ability and status E.  Nutritional status F.  Physical activity G. Advance directives H. List of other physicians I.  Hospitalizations, surgeries, and ER visits in previous 12 months J.  [place] to include hearing, vision, cognitive, depression L. Referrals and appointments - none  In addition, I have reviewed and discussed with patient certain preventive protocols, quality metrics, and best practice recommendations. A written personalized care plan for preventive services as well as general preventive health recommendations were provided to patient.  See attached scanned questionnaire for additional information.   Signed,   Lindell Noe, MHA, BS, LPN Health Coach

## 2018-04-19 NOTE — Progress Notes (Signed)
PCP notes:   Health maintenance:  No gaps identified.  Abnormal screenings:   Fall risk - hx of multiple falls Fall Risk  04/19/2018 04/19/2017 03/23/2016 03/21/2015 03/19/2014  Falls in the past year? Yes No No No No  Comment 2 falls due to loss of balance while doing yard work - - - -  Number falls in past yr: 2 or more - - - -  Injury with Fall? No - - - -   Patient concerns:   None  Nurse concerns:  None  Next PCP appt:   05/10/18 @ 1730

## 2018-04-20 ENCOUNTER — Other Ambulatory Visit: Payer: Self-pay | Admitting: *Deleted

## 2018-04-20 ENCOUNTER — Ambulatory Visit: Payer: Medicare Other

## 2018-04-20 MED ORDER — OMEPRAZOLE 20 MG PO CPDR: DELAYED_RELEASE_CAPSULE | ORAL | 3 refills | Status: DC

## 2018-04-22 NOTE — Progress Notes (Signed)
I reviewed health advisor's note, was available for consultation, and agree with documentation and plan.  

## 2018-04-26 ENCOUNTER — Encounter: Payer: Medicare Other | Admitting: Family Medicine

## 2018-05-10 ENCOUNTER — Encounter: Payer: Self-pay | Admitting: *Deleted

## 2018-05-10 ENCOUNTER — Ambulatory Visit (INDEPENDENT_AMBULATORY_CARE_PROVIDER_SITE_OTHER): Payer: Medicare Other | Admitting: Family Medicine

## 2018-05-10 ENCOUNTER — Encounter: Payer: Self-pay | Admitting: Family Medicine

## 2018-05-10 VITALS — BP 138/72 | HR 81 | Temp 98.6°F | Ht 62.0 in | Wt 154.2 lb

## 2018-05-10 DIAGNOSIS — M8580 Other specified disorders of bone density and structure, unspecified site: Secondary | ICD-10-CM | POA: Diagnosis not present

## 2018-05-10 DIAGNOSIS — E559 Vitamin D deficiency, unspecified: Secondary | ICD-10-CM

## 2018-05-10 DIAGNOSIS — Z0000 Encounter for general adult medical examination without abnormal findings: Secondary | ICD-10-CM

## 2018-05-10 DIAGNOSIS — R600 Localized edema: Secondary | ICD-10-CM

## 2018-05-10 DIAGNOSIS — R739 Hyperglycemia, unspecified: Secondary | ICD-10-CM | POA: Diagnosis not present

## 2018-05-10 DIAGNOSIS — C50911 Malignant neoplasm of unspecified site of right female breast: Secondary | ICD-10-CM

## 2018-05-10 DIAGNOSIS — E7800 Pure hypercholesterolemia, unspecified: Secondary | ICD-10-CM | POA: Diagnosis not present

## 2018-05-10 DIAGNOSIS — Z853 Personal history of malignant neoplasm of breast: Secondary | ICD-10-CM

## 2018-05-10 DIAGNOSIS — K760 Fatty (change of) liver, not elsewhere classified: Secondary | ICD-10-CM | POA: Diagnosis not present

## 2018-05-10 DIAGNOSIS — E2839 Other primary ovarian failure: Secondary | ICD-10-CM | POA: Diagnosis not present

## 2018-05-10 NOTE — Patient Instructions (Addendum)
Take a look at the [facility] eating plan  (most salt is in processed food)  Try to get most of your carbohydrates from produce (with the exception of white potatoes)  Eat less bread/pasta/rice/snack foods/cereals/sweets and other items from the middle of the grocery store (processed carbs)   Support hose Lots of water Elevate feet when you sit  Take the hctz as needed   Make sure to take your vitamin D every day  Aim to work up to 30 minutes of exercise daily   If you are interested in the new shingles vaccine (Shingrix) - call your local pharmacy to check on coverage and availability  If affordable- get on a wait list at your pharmacy   For cholesterol   Avoid red meat/ fried foods/ egg yolks/ fatty breakfast meats/ butter, cheese and high fat dairy/ and shellfish     We will work on referral for bone density test

## 2018-05-10 NOTE — Progress Notes (Addendum)
Subjective:    Patient ID: Brianna [name], female    DOB: 1946-10-23, 72 y.o.   MRN: [id number]  HPI Here for health maintenance exam and to review chronic medical problems    Working a lot  Not taking as good care of herself   Wt Readings from Last 3 Encounters:  05/10/18 154 lb 4 oz (70 kg)  04/19/18 156 lb 12 oz (71.1 kg)  04/20/17 140 lb 12.8 oz (63.9 kg)   28.21 kg/m   Feet are swelling more  Go down at night  Has not been taking the hctz regularly  She does not wear support stockings all the time  No sob or chest pain  Working on not adding salt    Had amw  Hx of falls noted from loosing balance doing yard work -now trying to be much more careful   No gaps/concerns  Colonoscopy 1/16 -no recall due to age  (or 63 y if she chooses)  dexa 6/16 stable osteopenia  Wants to schedule dexa  Has had falls No fractures  Vit D level 30.9 Needs to exercise more (then her ins dropped silver sneakers)   No mammograms due to bilat mastectomy for breast cancer in the past  No discomfort or skin changes  No longer has cancer and not being treated currently   Zoster status -had zostavax  bp is stable today  No cp or palpitations or headaches or edema  No side effects to medicines  BP Readings from Last 3 Encounters:  05/10/18 138/72  04/19/18 118/86  04/20/17 118/72     Lab Results  Component Value Date   CREATININE 0.92 04/19/2018   BUN 13 04/19/2018   NA 141 04/19/2018   K 4.6 04/19/2018   CL 105 04/19/2018   CO2 27 04/19/2018   Lab Results  Component Value Date   ALT 49 (H) 04/19/2018   AST 25 04/19/2018   ALKPHOS 58 04/19/2018   BILITOT 0.5 04/19/2018  stable  Fatty liver  Lab Results  Component Value Date   WBC 6.6 04/19/2018   HGB 12.9 04/19/2018   HCT 38.8 04/19/2018   MCV 84.3 04/19/2018   PLT 239.0 04/19/2018   Lab Results  Component Value Date   TSH 3.40 04/19/2018     Hyperglycemia Glucose 145 Lab Results  Component Value Date     HGBA1C 6.3 04/19/2018  this is up from 5.8    Hyperlipidemia Lab Results  Component Value Date   CHOL 239 (H) 04/19/2018   CHOL 244 (H) 04/19/2017   CHOL 204 (H) 03/16/2016   Lab Results  Component Value Date   HDL 42.00 04/19/2018   HDL 50.50 04/19/2017   HDL 47.30 03/16/2016   Lab Results  Component Value Date   LDLCALC 161 (H) 04/19/2018   LDLCALC 166 (H) 04/19/2017   LDLCALC 119 (H) 03/16/2016   Lab Results  Component Value Date   TRIG 182.0 (H) 04/19/2018   TRIG 139.0 04/19/2017   TRIG 189.0 (H) 03/16/2016   Lab Results  Component Value Date   CHOLHDL 6 04/19/2018   CHOLHDL 5 04/19/2017   CHOLHDL 4 03/16/2016   Lab Results  Component Value Date   LDLDIRECT 119.9 10/30/2012   LDLDIRECT 136.2 11/02/2011   LDLDIRECT 185.7 08/03/2011  intol of statin (pravastatin)-myalgias Also zetia -inc LFTs She is not eating as many almonds    Patient Active Problem List   Diagnosis Date Noted  . Pre-employment examination 08/29/2013  Priority: Medium  . Thumb pain, left 01/30/2019  . Plantar fasciitis 06/19/2018  . Low back pain 06/19/2018  . Abnormal urinalysis 06/19/2018  . Routine general medical examination at a health care facility 05/10/2018  . Chronic venous insufficiency 09/10/2016  . Pedal edema 08/13/2016  . Dyspepsia 04/12/2016  . Prediabetes 03/21/2015  . Nocturnal leg cramps 03/21/2015  . Estrogen deficiency 03/21/2015  . History of breast cancer 12/26/2014  . Encounter for Medicare annual wellness exam 02/28/2014  . Vitamin D deficiency 10/11/2008  . Hyperlipidemia 07/07/2007  . GERD 07/07/2007  . Osteoporosis 07/07/2007  . Fatty liver 07/07/2007   Past Medical History:  Diagnosis Date  . Allergic rhinitis   . Breast carcinoma ([place]) 10/11   right- eventual double mastectomy in 2012  . GERD (gastroesophageal reflux disease)   . Hyperlipidemia   . Osteoporosis    fosamax started 9/08  . Shingles   . Vitamin D deficiency    Past  Surgical History:  Procedure Laterality Date  . BREAST BIOPSY  10/11   invasive and in situ mam carcinoma  . BREAST LUMPECTOMY  11/11  . CATARACT EXTRACTION     does not remember when  . MASTECTOMY  1/12   double- Dr. Donne Hazel  . MASTECTOMY, RADICAL Bilateral 11/2010  . PARTIAL HYSTERECTOMY  1980's   non cancer, ovaries intact  . WRIST FRACTURE SURGERY Left    in the 1990's   Social History   Tobacco Use  . Smoking status: Never Smoker  . Smokeless tobacco: Never Used  Substance Use Topics  . Alcohol use: Yes    Alcohol/week: 0.0 standard drinks    Comment: wine rarely  . Drug use: No   Family History  Problem Relation Age of Onset  . Dementia Mother   . Cancer Father        esophageal  . Diabetes Brother   . Cancer Paternal Aunt        ovarian   Allergies  Allergen Reactions  . Pravachol     Myalgias  (As of visit on 05/17/2013, patient is taking Pravachol, but continues to have muscle pain)  . Zetia [Ezetimibe] Other (See Comments)    Ineffective, elevated liver enzymes   Current Outpatient Medications on File Prior to Visit  Medication Sig Dispense Refill  . Calcium Carbonate-Vitamin D (CALCIUM-VITAMIN D) 600-200 MG-UNIT CAPS Take 2 by mouth daily     . Cholecalciferol (VITAMIN D3 PO) Take 250 mcg by mouth daily.    . fexofenadine (ALLEGRA) 180 MG tablet Take otc as directed    . Multiple Vitamin (MULTIVITAMIN) tablet Take 1 tablet by mouth daily.       No current facility-administered medications on file prior to visit.    Review of Systems  Constitutional: Negative for activity change, appetite change, fatigue, fever and unexpected weight change.  HENT: Negative for congestion, ear pain, rhinorrhea, sinus pressure and sore throat.   Eyes: Negative for pain, redness and visual disturbance.  Respiratory: Negative for cough, shortness of breath and wheezing.   Cardiovascular: Positive for leg swelling. Negative for chest pain and palpitations.    Gastrointestinal: Negative for abdominal pain, blood in stool, constipation and diarrhea.  Endocrine: Negative for polydipsia and polyuria.  Genitourinary: Negative for dysuria, frequency and urgency.  Musculoskeletal: Negative for arthralgias, back pain and myalgias.  Skin: Negative for pallor and rash.  Allergic/Immunologic: Negative for environmental allergies.  Neurological: Negative for dizziness, syncope and headaches.  Hematological: Negative for adenopathy. Does not bruise/bleed  easily.  Psychiatric/Behavioral: Negative for decreased concentration and dysphoric mood. The patient is not nervous/anxious.        Objective:   Physical Exam  Constitutional: She appears well-developed and well-nourished. No distress.  HENT:  Head: Normocephalic and atraumatic.  Right Ear: External ear normal.  Left Ear: External ear normal.  Mouth/Throat: Oropharynx is clear and moist.  Eyes: Pupils are equal, round, and reactive to light. Conjunctivae and EOM are normal. No scleral icterus.  Neck: Normal range of motion. Neck supple. No JVD present. Carotid bruit is not present. No thyromegaly present.  Cardiovascular: Normal rate, regular rhythm, normal heart sounds and intact distal pulses. Exam reveals no gallop.  Pulmonary/Chest: Effort normal and breath sounds normal. No respiratory distress. She has no wheezes. She exhibits no tenderness. No breast tenderness, discharge or bleeding.  Abdominal: Soft. Bowel sounds are normal. She exhibits no distension, no abdominal bruit and no mass. There is no tenderness.  Genitourinary:  Genitourinary Comments: Bilateral mastectomy   Musculoskeletal: Normal range of motion. She exhibits edema. She exhibits no tenderness.  Trace to 1 plus pedal edema   No kyphosis   Lymphadenopathy:    She has no cervical adenopathy.  Neurological: She is alert. She has normal reflexes. No cranial nerve deficit. She exhibits normal muscle tone. Coordination normal.   Skin: Skin is warm and dry. No rash noted. No erythema. No pallor.  Solar lentigines diffusely Some sks and solar aging  Psychiatric: She has a normal mood and affect.  peasantl          Assessment & Plan:   Problem List Items Addressed This Visit      Digestive   Fatty liver    ALT is 49 Fairly stable Enc low fat diet and wt loss        Musculoskeletal and Integument   Osteoporosis    Disc need for calcium/ vitamin D/ wt bearing exercise and bone density test every 2 y to monitor Disc safety/ fracture risk in detail  Due for dexa- will schedule        Relevant Medications   Cholecalciferol (VITAMIN D3 PO)     Other   Vitamin D deficiency    Enc not to skip doses  Level 30.9  Disc imp to bone and overall health      Hyperlipidemia    Disc goals for lipids and reasons to control them Rev last labs with pt Rev low sat fat diet in detail  Intol of statin  zetia inc LFT        Prediabetes    Lab Results  Component Value Date   HGBA1C 6.3 04/19/2018   disc imp of low glycemic diet and wt loss to prevent DM2  Info on lifestyle change for DM prev given       Estrogen deficiency   Relevant Orders   DG Bone Density (Completed)   Pedal edema    Rev interventions- elevation/ supp hose/keep cool/DASH diet  Also hctz 12.5 daily if needed  No cardiac ref flags Update       Routine general medical examination at a health care facility - Primary    Reviewed health habits including diet and exercise and skin cancer prevention Reviewed appropriate screening tests for age  Also reviewed health mt list, fam hx and immunization status , as well as social and family history   See HPI Labs reviewed  shingrix discussed  Ref for dexa

## 2018-05-12 NOTE — Assessment & Plan Note (Signed)
Disc need for calcium/ vitamin D/ wt bearing exercise and bone density test every 2 y to monitor Disc safety/ fracture risk in detail  Due for dexa- will schedule

## 2018-05-12 NOTE — Assessment & Plan Note (Signed)
No re occurrence No mammograms in light of bilateral mastectomy

## 2018-05-12 NOTE — Assessment & Plan Note (Signed)
Lab Results  Component Value Date   HGBA1C 6.3 04/19/2018   disc imp of low glycemic diet and wt loss to prevent DM2  Info on lifestyle change for DM prev given

## 2018-05-12 NOTE — Assessment & Plan Note (Signed)
Rev interventions- elevation/ supp hose/keep cool/DASH diet  Also hctz 12.5 daily if needed  No cardiac ref flags Update

## 2018-05-12 NOTE — Assessment & Plan Note (Signed)
ALT is 49 Fairly stable Enc low fat diet and wt loss

## 2018-05-12 NOTE — Assessment & Plan Note (Signed)
Reviewed health habits including diet and exercise and skin cancer prevention Reviewed appropriate screening tests for age  Also reviewed health mt list, fam hx and immunization status , as well as social and family history   See HPI Labs reviewed  shingrix discussed  Ref for dexa

## 2018-05-12 NOTE — Assessment & Plan Note (Signed)
Enc not to skip doses  Level 30.9  Disc imp to bone and overall health

## 2018-05-12 NOTE — Assessment & Plan Note (Signed)
Disc goals for lipids and reasons to control them Rev last labs with pt Rev low sat fat diet in detail  Intol of statin  zetia inc LFT

## 2018-05-24 ENCOUNTER — Other Ambulatory Visit: Payer: Self-pay | Admitting: *Deleted

## 2018-05-24 MED ORDER — HYDROCHLOROTHIAZIDE 12.5 MG PO CAPS: 12.5000 mg | ORAL_CAPSULE | Freq: Every day | ORAL | 5 refills | Status: DC

## 2018-05-29 ENCOUNTER — Inpatient Hospital Stay: Admission: RE | Admit: 2018-05-29 | Payer: Medicare Other | Source: Ambulatory Visit

## 2018-06-06 ENCOUNTER — Ambulatory Visit (INDEPENDENT_AMBULATORY_CARE_PROVIDER_SITE_OTHER)
Admission: RE | Admit: 2018-06-06 | Discharge: 2018-06-06 | Disposition: A | Discharge status: Home / Self Care | Payer: Medicare Other | Source: Ambulatory Visit | Attending: Family Medicine | Admitting: Family Medicine

## 2018-06-06 DIAGNOSIS — E2839 Other primary ovarian failure: Secondary | ICD-10-CM

## 2018-06-08 ENCOUNTER — Encounter: Payer: Self-pay | Admitting: Family Medicine

## 2018-06-14 ENCOUNTER — Encounter: Payer: Self-pay | Admitting: Family Medicine

## 2018-06-14 ENCOUNTER — Ambulatory Visit (INDEPENDENT_AMBULATORY_CARE_PROVIDER_SITE_OTHER): Payer: Medicare Other | Admitting: Family Medicine

## 2018-06-14 VITALS — BP 132/76 | HR 88 | Temp 98.4°F | Ht 62.0 in | Wt 154.2 lb

## 2018-06-14 DIAGNOSIS — M810 Age-related osteoporosis without current pathological fracture: Secondary | ICD-10-CM

## 2018-06-14 DIAGNOSIS — E559 Vitamin D deficiency, unspecified: Secondary | ICD-10-CM

## 2018-06-14 LAB — BASIC METABOLIC PANEL
BUN: 16 mg/dL (ref 6–23)
CO2: 30 mEq/L (ref 19–32)
CREATININE: 1.02 mg/dL (ref 0.40–1.20)
Calcium: 9.3 mg/dL (ref 8.4–10.5)
Chloride: 101 mEq/L (ref 96–112)
GFR: 56.29 mL/min — AB (ref >60.00)
Glucose, Bld: 227 mg/dL — ABNORMAL HIGH (ref 70–99)
Potassium: 4.4 mEq/L (ref 3.5–5.1)
Sodium: 140 mEq/L (ref 135–145)

## 2018-06-14 LAB — CALCIUM: CALCIUM: 9.3 mg/dL (ref 8.4–10.5)

## 2018-06-14 NOTE — Patient Instructions (Addendum)
Here is some into on Prolia (the medicine I am interested in for your osteoporosis)  I will have our staff member look into it and see if it is covered   If not - may consider Evista   Weight bearing exercise is very good for osteoporosis  Keep taking your vitamin D

## 2018-06-14 NOTE — Progress Notes (Signed)
Subjective:    Patient ID: Brianna [name], female    DOB: 21-May-1946, 72 y.o.   MRN: [id number]  HPI Here for f/u of osteoporosis  Wt Readings from Last 3 Encounters:  06/14/18 154 lb 4 oz (70 kg)  05/10/18 154 lb 4 oz (70 kg)  04/19/18 156 lb 12 oz (71.1 kg)   28.21 kg/m   dexa 06/06/18 showed T score of -1.1 at the spine, and -2.6 at L FN, -2.5 at RFN BMD went down 6.5% in hips and 1.9% in spine   Hx of breast cancer and tx with tamoxifen  Hx of D def--- level 30.9 in May Hx of partial hysterectomy - late 43s , thinks she went through menopause in 20s  Hx of taking fosamax over 5 years  Small frame=ht of 5 ft 2 in Mother had OP (her mother had a hip fracture in elder years )  fx L wrist  fx top of L foot (in late 20s)   Takes omeprazole   She is interested in tx   Patient Active Problem List   Diagnosis Date Noted  . Pre-employment examination 08/29/2013    Priority: Medium  . Routine general medical examination at a health care facility 05/10/2018  . Chronic venous insufficiency 09/10/2016  . Pedal edema 08/13/2016  . Dyspepsia 04/12/2016  . Hyperglycemia 03/21/2015  . Nocturnal leg cramps 03/21/2015  . Estrogen deficiency 03/21/2015  . Breast cancer, right breast ([place]) 12/26/2014  . Encounter for Medicare annual wellness exam 02/28/2014  . Vitamin D deficiency 10/11/2008  . Hyperlipidemia 07/07/2007  . GERD 07/07/2007  . Osteoporosis 07/07/2007  . Fatty liver 07/07/2007   Past Medical History:  Diagnosis Date  . Allergic rhinitis   . Breast carcinoma ([place]) 10/11   right- eventual double mastectomy in 2012  . GERD (gastroesophageal reflux disease)   . Hyperlipidemia   . Osteoporosis    fosamax started 9/08  . Shingles   . Vitamin D deficiency    Past Surgical History:  Procedure Laterality Date  . BREAST BIOPSY  10/11   invasive and in situ mam carcinoma  . BREAST LUMPECTOMY  11/11  . CATARACT EXTRACTION     does not remember when  .  MASTECTOMY  1/12   double- Dr. Donne Hazel  . MASTECTOMY, RADICAL Bilateral 11/2010  . PARTIAL HYSTERECTOMY  1980's   non cancer, ovaries intact  . WRIST FRACTURE SURGERY Left    in the 1990's   Social History   Tobacco Use  . Smoking status: Never Smoker  . Smokeless tobacco: Never Used  Substance Use Topics  . Alcohol use: Yes    Alcohol/week: 0.0 oz    Comment: wine rarely  . Drug use: No   Family History  Problem Relation Age of Onset  . Dementia Mother   . Cancer Father        esophageal  . Diabetes Brother   . Cancer Paternal Aunt        ovarian   Allergies  Allergen Reactions  . Pravachol     Myalgias  (As of visit on 05/17/2013, patient is taking Pravachol, but continues to have muscle pain)  . Zetia [Ezetimibe] Other (See Comments)    Ineffective, elevated liver enzymes   Current Outpatient Medications on File Prior to Visit  Medication Sig Dispense Refill  . Calcium Carbonate-Vitamin D (CALCIUM-VITAMIN D) 600-200 MG-UNIT CAPS Take 2 by mouth daily     . Cholecalciferol (VITAMIN D3 PO) Take 250 mcg  by mouth daily.    . fexofenadine (ALLEGRA) 180 MG tablet Take otc as directed    . hydrochlorothiazide (MICROZIDE) 12.5 MG capsule Take 1 capsule (12.5 mg total) by mouth daily. 30 capsule 5  . Multiple Vitamin (MULTIVITAMIN) tablet Take 1 tablet by mouth daily.      Marland Kitchen omeprazole (PRILOSEC) 20 MG capsule TAKE 1 CAPSULE (20 MG TOTAL) BY MOUTH DAILY. 90 capsule 3   No current facility-administered medications on file prior to visit.     Review of Systems  Constitutional: Negative for activity change, appetite change, fatigue, fever and unexpected weight change.  HENT: Negative for congestion, ear pain, rhinorrhea, sinus pressure and sore throat.   Eyes: Negative for pain, redness and visual disturbance.  Respiratory: Negative for cough, shortness of breath and wheezing.   Cardiovascular: Negative for chest pain and palpitations.  Gastrointestinal: Negative for  abdominal pain, blood in stool, constipation and diarrhea.  Endocrine: Negative for polydipsia and polyuria.  Genitourinary: Negative for dysuria, frequency and urgency.  Musculoskeletal: Positive for back pain. Negative for arthralgias and myalgias.       Chronic back pain  occ foot pain-ams  Skin: Negative for pallor and rash.  Allergic/Immunologic: Negative for environmental allergies.  Neurological: Negative for dizziness, syncope and headaches.  Hematological: Negative for adenopathy. Does not bruise/bleed easily.  Psychiatric/Behavioral: Negative for decreased concentration and dysphoric mood. The patient is not nervous/anxious.        Objective:   Physical Exam  Constitutional: She is oriented to person, place, and time. She appears well-developed and well-nourished. No distress.  overwt well app Small frame  HENT:  Head: Normocephalic and atraumatic.  Mouth/Throat: Oropharynx is clear and moist.  Eyes: Pupils are equal, round, and reactive to light. Conjunctivae and EOM are normal. No scleral icterus.  Neck: Normal range of motion. Neck supple.  Pulmonary/Chest: Effort normal and breath sounds normal.  Musculoskeletal: Normal range of motion. She exhibits no edema or deformity.  No kyphosis  Lymphadenopathy:    She has no cervical adenopathy.  Neurological: She is alert and oriented to person, place, and time. No cranial nerve deficit. Coordination normal.  Skin: Skin is warm and dry. No rash noted. No pallor.  Psychiatric: She has a normal mood and affect.          Assessment & Plan:   Problem List Items Addressed This Visit      Musculoskeletal and Integument   Osteoporosis - Primary    Post menopausal female 72 yo Hx of wrist fx  Hx of breast cancer with tamoxifen fam hx OP mother-and she had hip fx Small frame On PPI Past vit D def (now taking)  Enc more exercise  BMD down 1.9% in spine and 6.5% hips  Had full course of fosamax in the past  Interested  in [employer] and will start looking into coverage If not- perhaps evista  Lab today for ca and BMET       Relevant Orders   Basic metabolic panel   Calcium     Other   Vitamin D deficiency

## 2018-06-14 NOTE — Assessment & Plan Note (Signed)
Post menopausal female 72 yo Hx of wrist fx  Hx of breast cancer with tamoxifen fam hx OP mother-and she had hip fx Small frame On PPI Past vit D def (now taking)  Enc more exercise  BMD down 1.9% in spine and 6.5% hips  Had full course of fosamax in the past  Interested in [employer] and will start looking into coverage If not- perhaps evista  Lab today for ca and BMET

## 2018-06-15 ENCOUNTER — Telehealth: Payer: Self-pay | Admitting: Family Medicine

## 2018-06-15 NOTE — Telephone Encounter (Signed)
It's the prolia inj will route to [facility] to f/u regarding getting injections

## 2018-06-15 NOTE — Telephone Encounter (Signed)
Copied from [name] [id number]. Topic: Quick Communication - Lab Results >> Jun 15, 2018 12:43 PM Brianna [name], [place] wrote: Called patient to inform them of lab results. When patient returns call, triage nurse may disclose results.  Patient returning call and would also like to follow up on the injection she will possibly be getting. Please contact.

## 2018-06-16 ENCOUNTER — Telehealth: Payer: Self-pay | Admitting: *Deleted

## 2018-06-16 NOTE — Telephone Encounter (Signed)
Information has been submitted to pts insurance for verification of benefits. Awaiting response for coverage  

## 2018-06-16 NOTE — Assessment & Plan Note (Signed)
Level 30 in May  Continue oral supplementation for bone and overall health

## 2018-06-16 NOTE — Telephone Encounter (Signed)
See additional phone note. 

## 2018-06-19 ENCOUNTER — Encounter: Payer: Self-pay | Admitting: Family Medicine

## 2018-06-19 ENCOUNTER — Ambulatory Visit (INDEPENDENT_AMBULATORY_CARE_PROVIDER_SITE_OTHER)
Admission: RE | Admit: 2018-06-19 | Discharge: 2018-06-19 | Disposition: A | Discharge status: Home / Self Care | Payer: Medicare Other | Source: Ambulatory Visit | Attending: Family Medicine | Admitting: Family Medicine

## 2018-06-19 ENCOUNTER — Ambulatory Visit (INDEPENDENT_AMBULATORY_CARE_PROVIDER_SITE_OTHER): Payer: Medicare Other | Admitting: Family Medicine

## 2018-06-19 VITALS — BP 138/72 | HR 78 | Temp 98.2°F | Ht 62.0 in | Wt 155.8 lb

## 2018-06-19 DIAGNOSIS — R8290 Unspecified abnormal findings in urine: Secondary | ICD-10-CM | POA: Diagnosis not present

## 2018-06-19 DIAGNOSIS — G8929 Other chronic pain: Secondary | ICD-10-CM | POA: Diagnosis not present

## 2018-06-19 DIAGNOSIS — M545 Low back pain, unspecified: Secondary | ICD-10-CM

## 2018-06-19 DIAGNOSIS — M722 Plantar fascial fibromatosis: Secondary | ICD-10-CM | POA: Insufficient documentation

## 2018-06-19 LAB — POC URINALSYSI DIPSTICK (AUTOMATED)
Bilirubin, UA: NEGATIVE
Blood, UA: NEGATIVE
GLUCOSE UA: NEGATIVE
Ketones, UA: NEGATIVE
Nitrite, UA: NEGATIVE
Protein, UA: NEGATIVE
SPEC GRAV UA: 1.025 (ref 1.010–1.025)
Urobilinogen, UA: 0.2 E.U./dL
pH, UA: 6 (ref 5.0–8.0)

## 2018-06-19 MED ORDER — MELOXICAM 7.5 MG PO TABS: 7.5000 mg | ORAL_TABLET | Freq: Every day | ORAL | 3 refills | Status: DC | PRN

## 2018-06-19 NOTE — Patient Instructions (Signed)
For foot and back pain- take meloxicam daily for a week-then as needed (always with food)   Xray of back today  We will contact you with a result   I think you have plantar fasciitis  Freeze a water bottle- and roll feet over it for several minutes in the am , and as often as you want Stick to hard soled shoes  Avoid barefoot and bedroom slippers for now  Keep shoes by the bed   If not improved in 2 weeks - call and let me know and I will set you up for a visit with Dr Lorelei Pont

## 2018-06-19 NOTE — Assessment & Plan Note (Signed)
R sided  Mostly muscular tenderness  Given length of time- check LS film  No neuro symptoms  meloxicam 7.5 mg  Stretches/rehab handout given

## 2018-06-19 NOTE — Assessment & Plan Note (Signed)
Leukocytes Doubt reason for back pain Sent for cx No urinary symptoms

## 2018-06-19 NOTE — Assessment & Plan Note (Signed)
Worse on R Adv always wear hard soles shoes for ambulation  No barefoot  Stretching/ice / (frozen bottle massage)  Handout given  meloxicam prn low dose Consider sport med consult if no imp

## 2018-06-19 NOTE — Progress Notes (Signed)
Subjective:    Patient ID: Brianna [name], female    DOB: 1946-04-29, 72 y.o.   MRN: [id number]  HPI  Here for low back pain and bilateral foot pain (right is worse)  Wt Readings from Last 3 Encounters:  06/19/18 155 lb 12 oz (70.6 kg)  06/14/18 154 lb 4 oz (70 kg)  05/10/18 154 lb 4 oz (70 kg)   28.49 kg/m   Low back pain -- at least 2-3 years  Dull ache- never goes away entirely = mostly on the R side  Worse - when she is still - gets stiff after inactivity (esp in the am)   occ she will get a sharp pain (a catch) - on the R side   Pain does not shoot down leg  No numbness  No weakness/ foot drop   Lying flat on her back helps (though she likes to sleep on her side)   Walking- sometimes bothers her /other times it does not  Loosens it up perhaps after a while   Feet -  Bottom of feet- heels  Worse on R than L foot  Pretty bad first thing in the am- then walking eases it off  Does wear bedroom shoes/ does not go barefoot   Aleve helps Tylenol arthritis helps a bit   Patient Active Problem List   Diagnosis Date Noted  . Pre-employment examination 08/29/2013    Priority: Medium  . Plantar fasciitis 06/19/2018  . Low back pain 06/19/2018  . Routine general medical examination at a health care facility 05/10/2018  . Chronic venous insufficiency 09/10/2016  . Pedal edema 08/13/2016  . Dyspepsia 04/12/2016  . Hyperglycemia 03/21/2015  . Nocturnal leg cramps 03/21/2015  . Estrogen deficiency 03/21/2015  . Breast cancer, right breast ([place]) 12/26/2014  . Encounter for Medicare annual wellness exam 02/28/2014  . Vitamin D deficiency 10/11/2008  . Hyperlipidemia 07/07/2007  . GERD 07/07/2007  . Osteoporosis 07/07/2007  . Fatty liver 07/07/2007   Past Medical History:  Diagnosis Date  . Allergic rhinitis   . Breast carcinoma ([place]) 10/11   right- eventual double mastectomy in 2012  . GERD (gastroesophageal reflux disease)   . Hyperlipidemia   . Osteoporosis      fosamax started 9/08  . Shingles   . Vitamin D deficiency    Past Surgical History:  Procedure Laterality Date  . BREAST BIOPSY  10/11   invasive and in situ mam carcinoma  . BREAST LUMPECTOMY  11/11  . CATARACT EXTRACTION     does not remember when  . MASTECTOMY  1/12   double- Dr. Donne Hazel  . MASTECTOMY, RADICAL Bilateral 11/2010  . PARTIAL HYSTERECTOMY  1980's   non cancer, ovaries intact  . WRIST FRACTURE SURGERY Left    in the 1990's   Social History   Tobacco Use  . Smoking status: Never Smoker  . Smokeless tobacco: Never Used  Substance Use Topics  . Alcohol use: Yes    Alcohol/week: 0.0 oz    Comment: wine rarely  . Drug use: No   Family History  Problem Relation Age of Onset  . Dementia Mother   . Cancer Father        esophageal  . Diabetes Brother   . Cancer Paternal Aunt        ovarian   Allergies  Allergen Reactions  . Pravachol     Myalgias  (As of visit on 05/17/2013, patient is taking Pravachol, but continues to have muscle pain)  .  Zetia [Ezetimibe] Other (See Comments)    Ineffective, elevated liver enzymes   Current Outpatient Medications on File Prior to Visit  Medication Sig Dispense Refill  . Calcium Carbonate-Vitamin D (CALCIUM-VITAMIN D) 600-200 MG-UNIT CAPS Take 2 by mouth daily     . Cholecalciferol (VITAMIN D3 PO) Take 250 mcg by mouth daily.    . fexofenadine (ALLEGRA) 180 MG tablet Take otc as directed    . hydrochlorothiazide (MICROZIDE) 12.5 MG capsule Take 1 capsule (12.5 mg total) by mouth daily. 30 capsule 5  . Multiple Vitamin (MULTIVITAMIN) tablet Take 1 tablet by mouth daily.      Marland Kitchen omeprazole (PRILOSEC) 20 MG capsule TAKE 1 CAPSULE (20 MG TOTAL) BY MOUTH DAILY. 90 capsule 3   No current facility-administered medications on file prior to visit.     Review of Systems     Objective:   Physical Exam  Constitutional: She appears well-developed and well-nourished. No distress.  Well appearing elderly female   HENT:   Head: Normocephalic and atraumatic.  Eyes: Pupils are equal, round, and reactive to light. Conjunctivae and EOM are normal. No scleral icterus.  Neck: Normal range of motion. Neck supple.  Cardiovascular: Normal rate and regular rhythm.  Pulmonary/Chest: Effort normal and breath sounds normal. She has no wheezes. She has no rales.  Abdominal: Soft. Bowel sounds are normal. She exhibits no distension. There is no tenderness.  Musculoskeletal: She exhibits tenderness.       Right shoulder: She exhibits decreased range of motion, tenderness and spasm. She exhibits no bony tenderness, no swelling, no crepitus, no deformity, normal pulse and normal strength.       Lumbar back: She exhibits decreased range of motion, tenderness and spasm. She exhibits no bony tenderness and no edema.  Tender in R lumbar musculature  Flex 45 deg Ext full  Pain on R lateral bend Neg SLR Nl rom of hips   Tender feet- esp plantar heel  No M or knots felt  Nl rom of foot Pain at start of ambulation   Lymphadenopathy:    She has no cervical adenopathy.  Neurological: She is alert. She has normal strength and normal reflexes. She displays no atrophy and normal reflexes. No cranial nerve deficit or sensory deficit. She exhibits normal muscle tone. Coordination normal.  Negative SLR  Skin: Skin is warm and dry. Capillary refill takes less than 2 seconds. No rash noted. No erythema. No pallor.  Psychiatric: She has a normal mood and affect.          Assessment & Plan:   Problem List Items Addressed This Visit      Musculoskeletal and Integument   Plantar fasciitis    Worse on R Adv always wear hard soles shoes for ambulation  No barefoot  Stretching/ice / (frozen bottle massage)  Handout given  meloxicam prn low dose Consider sport med consult if no imp         Other   Abnormal urinalysis    Leukocytes Doubt reason for back pain Sent for cx No urinary symptoms       Relevant Orders   Urine  Culture   Low back pain - Primary    R sided  Mostly muscular tenderness  Given length of time- check LS film  No neuro symptoms  meloxicam 7.5 mg  Stretches/rehab handout given       Relevant Medications   meloxicam (MOBIC) 7.5 MG tablet   Other Relevant Orders   POCT Urinalysis Dipstick (Automated) (  Completed)   DG Lumbar Spine Complete (Completed)

## 2018-06-20 ENCOUNTER — Telehealth: Payer: Self-pay | Admitting: Family Medicine

## 2018-06-20 DIAGNOSIS — M545 Low back pain, unspecified: Secondary | ICD-10-CM

## 2018-06-20 DIAGNOSIS — G8929 Other chronic pain: Secondary | ICD-10-CM

## 2018-06-20 LAB — URINE CULTURE
MICRO NUMBER:: 90893372
RESULT: NO GROWTH
SPECIMEN QUALITY: ADEQUATE

## 2018-06-20 NOTE — Telephone Encounter (Signed)
-----   Message from Tammi Sou, [place] sent at 06/20/2018 12:36 PM EDT ----- Pt notified of xray results and Dr. Marliss Coots comments. Pt does agree with PT referral, I advise pt our [facility] will call to schedule appt, please put referral in thanks

## 2018-06-20 NOTE — Telephone Encounter (Signed)
Ref done  Will route to [facility] 

## 2018-06-21 NOTE — Telephone Encounter (Signed)
PT Appt made and patient notified

## 2018-06-27 ENCOUNTER — Telehealth: Payer: Self-pay | Admitting: *Deleted

## 2018-06-27 DIAGNOSIS — S39012D Strain of muscle, fascia and tendon of lower back, subsequent encounter: Secondary | ICD-10-CM | POA: Diagnosis not present

## 2018-06-27 DIAGNOSIS — M545 Low back pain: Secondary | ICD-10-CM | POA: Diagnosis not present

## 2018-06-27 DIAGNOSIS — M6281 Muscle weakness (generalized): Secondary | ICD-10-CM | POA: Diagnosis not present

## 2018-06-27 DIAGNOSIS — G8929 Other chronic pain: Secondary | ICD-10-CM | POA: Diagnosis not present

## 2018-06-27 NOTE — Telephone Encounter (Signed)
Verification of benefits have been processed and an approval has been received for pts prolia injection. Pts estimated cost are appx $80, if pt has not met deductible. If so, her out of pocket expense will be app $0. This is only an estimate and cannot be confirmed until benefits are paid. Please advise pt and schedule if needed. If scheduled, once the injection is received, pls contact me back with the date it was received so that I am able to update prolia folder. Thanks   Spoke to pt and advised; agreeable. prolia injection scheduled.

## 2018-06-27 NOTE — Telephone Encounter (Signed)
Copied from [place] [id number]. Topic: General - Other >> Jun 27, 2018  2:53 PM Cecelia Byars, [place] wrote: Reason for CRM: Patient called and said she does not need a call in the morning to remind  her of her 8:00 appointment and she thank Earl Lagos so much for being willing to do so

## 2018-06-28 ENCOUNTER — Ambulatory Visit (INDEPENDENT_AMBULATORY_CARE_PROVIDER_SITE_OTHER): Payer: Medicare Other

## 2018-06-28 DIAGNOSIS — M810 Age-related osteoporosis without current pathological fracture: Secondary | ICD-10-CM | POA: Diagnosis not present

## 2018-06-28 MED ORDER — DENOSUMAB 60 MG/ML ~~LOC~~ SOSY
60.0000 mg | PREFILLED_SYRINGE | Freq: Once | SUBCUTANEOUS | Status: AC
  Administered 2018-06-28: 60 mg via SUBCUTANEOUS

## 2018-06-28 NOTE — Progress Notes (Signed)
Per orders of Dr. [name], injection of Prolia 60 mg given by Ozzie Hoyle. Patient tolerated injection well.

## 2018-06-29 DIAGNOSIS — M6281 Muscle weakness (generalized): Secondary | ICD-10-CM | POA: Diagnosis not present

## 2018-06-29 DIAGNOSIS — G8929 Other chronic pain: Secondary | ICD-10-CM | POA: Diagnosis not present

## 2018-06-29 DIAGNOSIS — S39012D Strain of muscle, fascia and tendon of lower back, subsequent encounter: Secondary | ICD-10-CM | POA: Diagnosis not present

## 2018-06-29 DIAGNOSIS — M545 Low back pain: Secondary | ICD-10-CM | POA: Diagnosis not present

## 2018-07-10 DIAGNOSIS — M545 Low back pain: Secondary | ICD-10-CM | POA: Diagnosis not present

## 2018-07-10 DIAGNOSIS — G8929 Other chronic pain: Secondary | ICD-10-CM | POA: Diagnosis not present

## 2018-07-10 DIAGNOSIS — M6281 Muscle weakness (generalized): Secondary | ICD-10-CM | POA: Diagnosis not present

## 2018-07-10 DIAGNOSIS — S39012D Strain of muscle, fascia and tendon of lower back, subsequent encounter: Secondary | ICD-10-CM | POA: Diagnosis not present

## 2018-07-12 DIAGNOSIS — M545 Low back pain: Secondary | ICD-10-CM | POA: Diagnosis not present

## 2018-07-12 DIAGNOSIS — M6281 Muscle weakness (generalized): Secondary | ICD-10-CM | POA: Diagnosis not present

## 2018-07-12 DIAGNOSIS — G8929 Other chronic pain: Secondary | ICD-10-CM | POA: Diagnosis not present

## 2018-07-12 DIAGNOSIS — S39012D Strain of muscle, fascia and tendon of lower back, subsequent encounter: Secondary | ICD-10-CM | POA: Diagnosis not present

## 2018-07-18 DIAGNOSIS — M6281 Muscle weakness (generalized): Secondary | ICD-10-CM | POA: Diagnosis not present

## 2018-07-18 DIAGNOSIS — S39012D Strain of muscle, fascia and tendon of lower back, subsequent encounter: Secondary | ICD-10-CM | POA: Diagnosis not present

## 2018-07-18 DIAGNOSIS — M545 Low back pain: Secondary | ICD-10-CM | POA: Diagnosis not present

## 2018-07-18 DIAGNOSIS — G8929 Other chronic pain: Secondary | ICD-10-CM | POA: Diagnosis not present

## 2018-07-20 DIAGNOSIS — G8929 Other chronic pain: Secondary | ICD-10-CM | POA: Diagnosis not present

## 2018-07-20 DIAGNOSIS — M545 Low back pain: Secondary | ICD-10-CM | POA: Diagnosis not present

## 2018-07-20 DIAGNOSIS — S39012D Strain of muscle, fascia and tendon of lower back, subsequent encounter: Secondary | ICD-10-CM | POA: Diagnosis not present

## 2018-07-20 DIAGNOSIS — M6281 Muscle weakness (generalized): Secondary | ICD-10-CM | POA: Diagnosis not present

## 2018-07-25 DIAGNOSIS — M6281 Muscle weakness (generalized): Secondary | ICD-10-CM | POA: Diagnosis not present

## 2018-07-25 DIAGNOSIS — M545 Low back pain: Secondary | ICD-10-CM | POA: Diagnosis not present

## 2018-07-25 DIAGNOSIS — S39012D Strain of muscle, fascia and tendon of lower back, subsequent encounter: Secondary | ICD-10-CM | POA: Diagnosis not present

## 2018-07-25 DIAGNOSIS — G8929 Other chronic pain: Secondary | ICD-10-CM | POA: Diagnosis not present

## 2018-07-31 DIAGNOSIS — S39012D Strain of muscle, fascia and tendon of lower back, subsequent encounter: Secondary | ICD-10-CM | POA: Diagnosis not present

## 2018-07-31 DIAGNOSIS — G8929 Other chronic pain: Secondary | ICD-10-CM | POA: Diagnosis not present

## 2018-07-31 DIAGNOSIS — M6281 Muscle weakness (generalized): Secondary | ICD-10-CM | POA: Diagnosis not present

## 2018-07-31 DIAGNOSIS — M545 Low back pain: Secondary | ICD-10-CM | POA: Diagnosis not present

## 2018-08-02 DIAGNOSIS — M545 Low back pain: Secondary | ICD-10-CM | POA: Diagnosis not present

## 2018-08-02 DIAGNOSIS — M6281 Muscle weakness (generalized): Secondary | ICD-10-CM | POA: Diagnosis not present

## 2018-08-02 DIAGNOSIS — G8929 Other chronic pain: Secondary | ICD-10-CM | POA: Diagnosis not present

## 2018-08-02 DIAGNOSIS — S39012D Strain of muscle, fascia and tendon of lower back, subsequent encounter: Secondary | ICD-10-CM | POA: Diagnosis not present

## 2018-08-14 DIAGNOSIS — G8929 Other chronic pain: Secondary | ICD-10-CM | POA: Diagnosis not present

## 2018-08-14 DIAGNOSIS — M545 Low back pain: Secondary | ICD-10-CM | POA: Diagnosis not present

## 2018-08-14 DIAGNOSIS — S39012D Strain of muscle, fascia and tendon of lower back, subsequent encounter: Secondary | ICD-10-CM | POA: Diagnosis not present

## 2018-08-14 DIAGNOSIS — M6281 Muscle weakness (generalized): Secondary | ICD-10-CM | POA: Diagnosis not present

## 2018-12-04 ENCOUNTER — Telehealth: Payer: Self-pay | Admitting: *Deleted

## 2018-12-04 NOTE — Telephone Encounter (Signed)
Information has been submitted to pts insurance for verification of benefits. Awaiting response for coverage  

## 2019-01-26 ENCOUNTER — Telehealth: Payer: Self-pay

## 2019-01-26 NOTE — Telephone Encounter (Signed)
Prolia Benefits submitted to insurance for review, awaiting cost approval.  Once benefits identified will contact patient to review and move forward with scheduling.

## 2019-01-30 ENCOUNTER — Encounter: Payer: Self-pay | Admitting: Family Medicine

## 2019-01-30 ENCOUNTER — Ambulatory Visit (INDEPENDENT_AMBULATORY_CARE_PROVIDER_SITE_OTHER)
Admission: RE | Admit: 2019-01-30 | Discharge: 2019-01-30 | Disposition: A | Discharge status: Home / Self Care | Payer: Medicare Other | Source: Ambulatory Visit | Attending: Family Medicine | Admitting: Family Medicine

## 2019-01-30 ENCOUNTER — Ambulatory Visit (INDEPENDENT_AMBULATORY_CARE_PROVIDER_SITE_OTHER): Payer: Medicare Other | Admitting: Family Medicine

## 2019-01-30 VITALS — BP 135/72 | HR 78 | Temp 98.0°F | Ht 62.0 in | Wt 153.2 lb

## 2019-01-30 DIAGNOSIS — C50911 Malignant neoplasm of unspecified site of right female breast: Secondary | ICD-10-CM

## 2019-01-30 DIAGNOSIS — M79645 Pain in left finger(s): Secondary | ICD-10-CM

## 2019-01-30 DIAGNOSIS — M1812 Unilateral primary osteoarthritis of first carpometacarpal joint, left hand: Secondary | ICD-10-CM | POA: Diagnosis not present

## 2019-01-30 NOTE — Progress Notes (Addendum)
Subjective:    Patient ID: Brianna [name], female    DOB: [birth year], 73 y.o.   MRN: [id number]  HPI Here for symptom of L thumb pain   Wt Readings from Last 3 Encounters:  01/30/19 153 lb 3 oz (69.5 kg)  06/19/18 155 lb 12 oz (70.6 kg)  06/14/18 154 lb 4 oz (70 kg)   28.02 kg/m   She is L handed L thumb started to hurt about 2 months ago  (not going away)  No injury/no trauma  No new reped movement or gripping   Pain is at the base of her thumb  Feels a little warmer than other thumb  No redness  No swelling in comparison to other thumb  Aching at times and sometimes a sharp pain (with grip)   Grip makes it worse  Lifting also  Writing hurts some  Brushing teeth-just a little   Hands both tend to be tight in the am   otc Ben gay Asper cream  Took aleve one time (helped some)  Took arthritis tylenol as well - ? If it helped much   Has tried soaking in hot water   Hx of wrist fx on that side/ plate and 2 screws  (never got 100% of strength back)  No old thumb trauma    BP is up also  Used to take hctz 12.5 mg for edema and stopped it  Has not needed it   BP Readings from Last 3 Encounters:  01/30/19 (!) 154/84  06/19/18 138/72  06/14/18 132/76  re check 135/72  History of breast cancer in the past No longer has it or treated for it   Lab Results  Component Value Date   CREATININE 1.02 06/14/2018   BUN 16 06/14/2018   NA 140 06/14/2018   K 4.4 06/14/2018   CL 101 06/14/2018   CO2 30 06/14/2018   Patient Active Problem List   Diagnosis Date Noted  . Pre-employment examination 08/29/2013    Priority: Medium  . Thumb pain, left 01/30/2019  . Plantar fasciitis 06/19/2018  . Low back pain 06/19/2018  . Abnormal urinalysis 06/19/2018  . Routine general medical examination at a health care facility 05/10/2018  . Chronic venous insufficiency 09/10/2016  . Pedal edema 08/13/2016  . Dyspepsia 04/12/2016  . Prediabetes 03/21/2015  . Nocturnal leg  cramps 03/21/2015  . Estrogen deficiency 03/21/2015  . History of breast cancer 12/26/2014  . Encounter for Medicare annual wellness exam 02/28/2014  . Vitamin D deficiency 10/11/2008  . Hyperlipidemia 07/07/2007  . GERD 07/07/2007  . Osteoporosis 07/07/2007  . Fatty liver 07/07/2007   Past Medical History:  Diagnosis Date  . Allergic rhinitis   . Breast carcinoma ([place]) 10/11   right- eventual double mastectomy in 2012  . GERD (gastroesophageal reflux disease)   . Hyperlipidemia   . Osteoporosis    fosamax started 9/08  . Shingles   . Vitamin D deficiency    Past Surgical History:  Procedure Laterality Date  . BREAST BIOPSY  10/11   invasive and in situ mam carcinoma  . BREAST LUMPECTOMY  11/11  . CATARACT EXTRACTION     does not remember when  . MASTECTOMY  1/12   double- Dr. Donne Hazel  . MASTECTOMY, RADICAL Bilateral 11/2010  . PARTIAL HYSTERECTOMY  1980's   non cancer, ovaries intact  . WRIST FRACTURE SURGERY Left    in the 1990's   Social History   Tobacco Use  . Smoking status:  Never Smoker  . Smokeless tobacco: Never Used  Substance Use Topics  . Alcohol use: Yes    Alcohol/week: 0.0 standard drinks    Comment: wine rarely  . Drug use: No   Family History  Problem Relation Age of Onset  . Dementia Mother   . Cancer Father        esophageal  . Diabetes Brother   . Cancer Paternal Aunt        ovarian   Allergies  Allergen Reactions  . Pravachol     Myalgias  (As of visit on 05/17/2013, patient is taking Pravachol, but continues to have muscle pain)  . Zetia [Ezetimibe] Other (See Comments)    Ineffective, elevated liver enzymes   Current Outpatient Medications on File Prior to Visit  Medication Sig Dispense Refill  . Calcium Carbonate-Vitamin D (CALCIUM-VITAMIN D) 600-200 MG-UNIT CAPS Take 2 by mouth daily     . Cholecalciferol (VITAMIN D3 PO) Take 250 mcg by mouth daily.    . Cyanocobalamin (B-12) 2000 MCG TABS Take 1 tablet by mouth daily.    .  fexofenadine (ALLEGRA) 180 MG tablet Take otc as directed    . Multiple Vitamin (MULTIVITAMIN) tablet Take 1 tablet by mouth daily.       No current facility-administered medications on file prior to visit.     Review of Systems  Constitutional: Negative for activity change, appetite change, fatigue, fever and unexpected weight change.  HENT: Negative for congestion, ear pain, rhinorrhea, sinus pressure and sore throat.   Eyes: Negative for pain, redness and visual disturbance.  Respiratory: Negative for cough, shortness of breath and wheezing.   Cardiovascular: Negative for chest pain and palpitations.  Gastrointestinal: Negative for abdominal pain, blood in stool, constipation and diarrhea.  Endocrine: Negative for polydipsia and polyuria.  Genitourinary: Negative for dysuria, frequency and urgency.  Musculoskeletal: Positive for arthralgias. Negative for back pain, joint swelling and myalgias.  Skin: Negative for pallor and rash.  Allergic/Immunologic: Negative for environmental allergies.  Neurological: Negative for dizziness, syncope and headaches.  Hematological: Negative for adenopathy. Does not bruise/bleed easily.  Psychiatric/Behavioral: Negative for decreased concentration and dysphoric mood. The patient is not nervous/anxious.        Objective:   Physical Exam Constitutional:      General: She is not in acute distress.    Appearance: Normal appearance. She is normal weight.  HENT:     Mouth/Throat:     Mouth: Mucous membranes are moist.     Pharynx: Oropharynx is clear.  Eyes:     General: No scleral icterus.    Conjunctiva/sclera: Conjunctivae normal.     Pupils: Pupils are equal, round, and reactive to light.  Neck:     Musculoskeletal: Normal range of motion.  Cardiovascular:     Rate and Rhythm: Normal rate and regular rhythm.     Heart sounds: Normal heart sounds.  Pulmonary:     Effort: Pulmonary effort is normal. No respiratory distress.     Breath  sounds: Normal breath sounds. No wheezing or rales.  Musculoskeletal:     Left hand: She exhibits tenderness and bony tenderness. She exhibits normal two-point discrimination, no deformity and no swelling. Normal sensation noted. Normal strength noted.     Comments: Tender over base of thumb at mcp joint and  base of phalanx  No crepitus No joint swelling or deformity or warmth  No triggering  Pain is worst on adduction of thumb and opposed extension   No neuro  changes    Lymphadenopathy:     Cervical: No cervical adenopathy.  Neurological:     Mental Status: She is alert.     Sensory: Sensation is intact.     Motor: Motor function is intact. No weakness, atrophy or abnormal muscle tone.     Comments: Neg tinel R wrist   Psychiatric:        Mood and Affect: Mood normal.           Assessment & Plan:   Problem List Items Addressed This Visit      Other   Thumb pain, left - Primary    Pain at joint between first metacarpal and phalanx No redness or swelling  Pain is worse with movement-esp adduction of thumb and opposed extension  No joint deformities on exam  ? OA vs other/ fx or tendonitis Xray today  Warm soaks/epsom if helpful  Aleve prn with caution  Relative rest when needed       Relevant Orders   DG Finger Thumb Left (Completed)

## 2019-01-30 NOTE — Assessment & Plan Note (Signed)
No changes or re occurrence

## 2019-01-30 NOTE — Assessment & Plan Note (Signed)
Pain at joint between first metacarpal and phalanx No redness or swelling  Pain is worse with movement-esp adduction of thumb and opposed extension  No joint deformities on exam  ? OA vs other/ fx or tendonitis Xray today  Warm soaks/epsom if helpful  Aleve prn with caution  Relative rest when needed

## 2019-01-30 NOTE — Patient Instructions (Signed)
If pain is intolerable- try 1 aleve with food up to every 12 hours  If stomach upset-stop it   Warm compress/ epsom salts are fine  Keep using your hand (back off if too painful)   We will check an xray and contact you with results

## 2019-01-31 NOTE — Progress Notes (Signed)
Dr. Frederico Hamman T. Kamilla Hands, MD, [facility] [place], [address] Phone: [phone] Fax: [phone]  02/01/2019  Patient: Brianna [name], MRN: [id number], DOB: 20-Apr-1946, 73 y.o.  Primary Physician:  Tower, Wynelle Fanny, MD   Chief Complaint  Patient presents with  . Thumb Pain    Left   Subjective:   Brianna [name] is a 73 y.o. very pleasant female patient who presents with the following:  She is a pleasant 73 year old lady who presents courtesy of Dr. [name] who presents with an approximate 44-month history of left-sided thumb pain.  History is significant for a left-sided wrist fracture with ORIF done distantly, where she has had some persistent pain longstanding.  Recently, however, she is complaining of triggering of the 1st digit on the Left side.  She also has pain in the flexor tendon on the 1st. There is no history of acute injury.  Past Medical History, Surgical History, Social History, Family History, Problem List, Medications, and Allergies have been reviewed and updated if relevant.  Patient Active Problem List   Diagnosis Date Noted  . Thumb pain, left 01/30/2019  . Plantar fasciitis 06/19/2018  . Low back pain 06/19/2018  . Abnormal urinalysis 06/19/2018  . Routine general medical examination at a health care facility 05/10/2018  . Chronic venous insufficiency 09/10/2016  . Pedal edema 08/13/2016  . Dyspepsia 04/12/2016  . Hyperglycemia 03/21/2015  . Nocturnal leg cramps 03/21/2015  . Estrogen deficiency 03/21/2015  . Breast cancer, right breast ([place]) 12/26/2014  . Encounter for Medicare annual wellness exam 02/28/2014  . Pre-employment examination 08/29/2013  . Vitamin D deficiency 10/11/2008  . Hyperlipidemia 07/07/2007  . GERD 07/07/2007  . Osteoporosis 07/07/2007  . Fatty liver 07/07/2007    Past Medical History:  Diagnosis Date  . Allergic rhinitis   . Breast carcinoma ([place]) 10/11   right-  eventual double mastectomy in 2012  . GERD (gastroesophageal reflux disease)   . Hyperlipidemia   . Osteoporosis    fosamax started 9/08  . Shingles   . Vitamin D deficiency     Past Surgical History:  Procedure Laterality Date  . BREAST BIOPSY  10/11   invasive and in situ mam carcinoma  . BREAST LUMPECTOMY  11/11  . CATARACT EXTRACTION     does not remember when  . MASTECTOMY  1/12   double- Dr. Donne Hazel  . MASTECTOMY, RADICAL Bilateral 11/2010  . PARTIAL HYSTERECTOMY  1980's   non cancer, ovaries intact  . WRIST FRACTURE SURGERY Left    in the 1990's    Social History   Socioeconomic History  . Marital status: Single    Spouse name: n/a  . Number of children: 1  . Years of education: Not on file  . Highest education level: Not on file  Occupational History  . Occupation: retired    Fish farm manager: [employer]  Social Needs  . Financial resource strain: Not on file  . Food insecurity:    Worry: Not on file    Inability: Not on file  . Transportation needs:    Medical: Not on file    Non-medical: Not on file  Tobacco Use  . Smoking status: Never Smoker  . Smokeless tobacco: Never Used  Substance and Sexual Activity  . Alcohol use: Yes    Alcohol/week: 0.0 standard drinks    Comment: wine rarely  . Drug use: No  . Sexual activity: Not Currently  Lifestyle  .  Physical activity:    Days per week: Not on file    Minutes per session: Not on file  . Stress: Not on file  Relationships  . Social connections:    Talks on phone: Not on file    Gets together: Not on file    Attends religious service: Not on file    Active member of club or organization: Not on file    Attends meetings of clubs or organizations: Not on file    Relationship status: Not on file  . Intimate partner violence:    Fear of current or ex partner: Not on file    Emotionally abused: Not on file    Physically abused: Not on file    Forced sexual activity: Not on file  Other Topics  Concern  . Not on file  Social History Narrative  . Not on file    Family History  Problem Relation Age of Onset  . Dementia Mother   . Cancer Father        esophageal  . Diabetes Brother   . Cancer Paternal Aunt        ovarian    Allergies  Allergen Reactions  . Pravachol     Myalgias  (As of visit on 05/17/2013, patient is taking Pravachol, but continues to have muscle pain)  . Zetia [Ezetimibe] Other (See Comments)    Ineffective, elevated liver enzymes    Medication list reviewed and updated in full in [facility].  GEN: No fevers, chills. Nontoxic. Primarily MSK c/o today. MSK: Detailed in the HPI GI: tolerating PO intake without difficulty Neuro: No numbness, parasthesias, or tingling associated. Otherwise the pertinent positives of the ROS are noted above.   Objective:   BP (!) 144/64   Pulse 84   Temp 98.8 F (37.1 C) (Oral)   Ht 5\' 2"  (1.575 m)   Wt 157 lb (71.2 kg)   BMI 28.72 kg/m    GEN: WDWN, NAD, Non-toxic, Alert & Oriented x 3 HEENT: Atraumatic, Normocephalic.  Ears and Nose: No external deformity. EXTR: No clubbing/cyanosis/edema NEURO: Normal gait.  PSYCH: Normally interactive. Conversant. Not depressed or anxious appearing.  Calm demeanor.    There is obvious triggering on the left first digit with a palpable nodule proximal to the MCP joint.  There is also some tenderness along the flexor tendon of the first digit.  No crepitus at the basal joint.  She does have some minor restriction of motion at the wrist and some tenderness with terminal motion.  All other bony anatomy of the hand and wrist is nontender.  Radiology: Dg Finger Thumb Left  Result Date: 01/30/2019 CLINICAL DATA:  Painful base of the thumb. EXAM: LEFT THUMB 2+V COMPARISON:  None. FINDINGS: There is no evidence of fracture or dislocation. Mild osteoarthritic changes of the first carpometacarpal joint. Soft tissues are unremarkable IMPRESSION: Mild osteoarthritic changes  of the first carpometacarpal joint. Electronically Signed   By: Fidela Salisbury M.D.   On: 01/30/2019 12:54    Assessment and Plan:   Trigger finger of left thumb - Plan: methylPREDNISolone acetate (DEPO-MEDROL) injection 20 mg  Tenosynovitis of finger  >25 minutes spent in face to face time with patient, >50% spent in counselling or coordination of care: The patient required more time, explanation, anatomical explanation, and multiple explanations of possible plans of care then is often seen with this typical pathology.  I went over everything in detail with her in terms of all conservative and interventional  treatments.  We discussed the pathophysiology of trigger fingers. Discussed the inflammatory nature of nodule creation and likely nodule abutting the A1 pulley system, this causing the patient's discomfort and sensations. We discussed that treatments for this include direct injection into the tendon sheath to attempt to shrink catching tissue. This can be done 1-2 times. Other treatments include surgical release. If the patient fails to trigger finger injections, I would recommend trigger finger release if the patient desires relief of the symptoms.   Tendon Sheath Injection Procedure Note Brianna [name] 1946-10-20 Date of procedure: 02/01/2019  Procedure: Tendon Sheath Injection for Trigger Finger and Flexor Tenosynovitis Indications: Pain  Procedure Details Verbal consent was obtained. Risks (including rare risk of infection, potential risk for skin lightening and potential atrophy), benefits and alternatives were discussed. Prepped with Chloraprep and Ethyl Chloride used for anesthesia. Under sterile conditions, patient injected at palmar crease aiming distally with 45 degree angle towards nodule; injected directly into tendon sheath. Medication flowed freely without resistance.  Needle size: 22 gauge 1 1/2 inch Injection: 1/2 cc of Lidocaine 1% and Depo-Medrol 20 mg Medication:  Depo-Medrol 20 mg   Follow-up: prn only  Meds ordered this encounter  Medications  . methylPREDNISolone acetate (DEPO-MEDROL) injection 20 mg   Signed,  Brantley Naser T. Stavros Cail, MD   Outpatient Encounter Medications as of 02/01/2019  Medication Sig  . Calcium Carbonate-Vitamin D (CALCIUM-VITAMIN D) 600-200 MG-UNIT CAPS Take 2 by mouth daily   . Cholecalciferol (VITAMIN D3 PO) Take 250 mcg by mouth daily.  . Cyanocobalamin (B-12) 2000 MCG TABS Take 1 tablet by mouth daily.  . fexofenadine (ALLEGRA) 180 MG tablet Take otc as directed  . Multiple Vitamin (MULTIVITAMIN) tablet Take 1 tablet by mouth daily.    Marland Kitchen omeprazole (PRILOSEC) 20 MG capsule TAKE 1 CAPSULE (20 MG TOTAL) BY MOUTH DAILY.  . [EXPIRED] methylPREDNISolone acetate (DEPO-MEDROL) injection 20 mg    No facility-administered encounter medications on file as of 02/01/2019.

## 2019-02-01 ENCOUNTER — Other Ambulatory Visit: Payer: Self-pay

## 2019-02-01 ENCOUNTER — Ambulatory Visit (INDEPENDENT_AMBULATORY_CARE_PROVIDER_SITE_OTHER): Payer: Medicare Other | Admitting: Family Medicine

## 2019-02-01 ENCOUNTER — Encounter: Payer: Self-pay | Admitting: Family Medicine

## 2019-02-01 VITALS — BP 144/64 | HR 84 | Temp 98.8°F | Ht 62.0 in | Wt 157.0 lb

## 2019-02-01 DIAGNOSIS — M659 Synovitis and tenosynovitis, unspecified: Secondary | ICD-10-CM | POA: Diagnosis not present

## 2019-02-01 DIAGNOSIS — M65312 Trigger thumb, left thumb: Secondary | ICD-10-CM | POA: Diagnosis not present

## 2019-02-01 MED ORDER — METHYLPREDNISOLONE ACETATE 40 MG/ML IJ SUSP
20.0000 mg | Freq: Once | INTRAMUSCULAR | Status: AC
Start: 2019-02-01 — End: 2019-02-01
  Administered 2019-02-01: 20 mg via INTRA_ARTICULAR

## 2019-03-29 ENCOUNTER — Telehealth: Payer: Self-pay | Admitting: Family Medicine

## 2019-04-11 ENCOUNTER — Ambulatory Visit: Payer: Medicare Other

## 2019-04-17 NOTE — Telephone Encounter (Signed)
Prolia benefits discussed w/pt and next injection scheduled for 04-18-2019.

## 2019-04-18 ENCOUNTER — Ambulatory Visit (INDEPENDENT_AMBULATORY_CARE_PROVIDER_SITE_OTHER): Payer: Medicare Other

## 2019-04-18 DIAGNOSIS — M810 Age-related osteoporosis without current pathological fracture: Secondary | ICD-10-CM | POA: Diagnosis not present

## 2019-04-18 MED ORDER — DENOSUMAB 60 MG/ML ~~LOC~~ SOSY
60.0000 mg | PREFILLED_SYRINGE | Freq: Once | SUBCUTANEOUS | Status: AC
  Administered 2019-04-18: 60 mg via SUBCUTANEOUS

## 2019-04-18 NOTE — Telephone Encounter (Signed)
Patient received Prolia injection today.

## 2019-04-18 NOTE — Progress Notes (Signed)
Per orders of Dr. Tower, injection of Prolia given by Anastasiya V Hopkins. Patient tolerated injection well.  

## 2019-05-03 ENCOUNTER — Telehealth: Payer: Self-pay | Admitting: Family Medicine

## 2019-05-03 ENCOUNTER — Ambulatory Visit (INDEPENDENT_AMBULATORY_CARE_PROVIDER_SITE_OTHER): Payer: Medicare Other

## 2019-05-03 ENCOUNTER — Other Ambulatory Visit: Payer: Self-pay

## 2019-05-03 ENCOUNTER — Other Ambulatory Visit (INDEPENDENT_AMBULATORY_CARE_PROVIDER_SITE_OTHER): Payer: Medicare Other

## 2019-05-03 VITALS — Wt 157.0 lb

## 2019-05-03 DIAGNOSIS — K760 Fatty (change of) liver, not elsewhere classified: Secondary | ICD-10-CM

## 2019-05-03 DIAGNOSIS — E559 Vitamin D deficiency, unspecified: Secondary | ICD-10-CM | POA: Diagnosis not present

## 2019-05-03 DIAGNOSIS — R7303 Prediabetes: Secondary | ICD-10-CM

## 2019-05-03 DIAGNOSIS — R600 Localized edema: Secondary | ICD-10-CM

## 2019-05-03 DIAGNOSIS — Z0000 Encounter for general adult medical examination without abnormal findings: Secondary | ICD-10-CM | POA: Diagnosis not present

## 2019-05-03 DIAGNOSIS — E7800 Pure hypercholesterolemia, unspecified: Secondary | ICD-10-CM

## 2019-05-03 DIAGNOSIS — M810 Age-related osteoporosis without current pathological fracture: Secondary | ICD-10-CM

## 2019-05-03 LAB — LIPID PANEL
Cholesterol: 223 mg/dL — ABNORMAL HIGH (ref 0–200)
HDL: 37.6 mg/dL — ABNORMAL LOW (ref >39.00)
NonHDL: 185.51
Total CHOL/HDL Ratio: 6
Triglycerides: 213 mg/dL — ABNORMAL HIGH (ref 0.0–149.0)
VLDL: 42.6 mg/dL — ABNORMAL HIGH (ref 0.0–40.0)

## 2019-05-03 LAB — VITAMIN D 25 HYDROXY (VIT D DEFICIENCY, FRACTURES): VITD: 43.95 ng/mL (ref 30.00–100.00)

## 2019-05-03 LAB — COMPREHENSIVE METABOLIC PANEL
ALT: 32 U/L (ref 0–35)
AST: 18 U/L (ref 0–37)
Albumin: 3.7 g/dL (ref 3.5–5.2)
Alkaline Phosphatase: 58 U/L (ref 39–117)
BUN: 15 mg/dL (ref 6–23)
CO2: 26 mEq/L (ref 19–32)
Calcium: 8.5 mg/dL (ref 8.4–10.5)
Chloride: 105 mEq/L (ref 96–112)
Creatinine, Ser: 1.04 mg/dL (ref 0.40–1.20)
GFR: 51.66 mL/min — ABNORMAL LOW (ref >60.00)
Glucose, Bld: 223 mg/dL — ABNORMAL HIGH (ref 70–99)
Potassium: 4.4 mEq/L (ref 3.5–5.1)
Sodium: 140 mEq/L (ref 135–145)
Total Bilirubin: 0.6 mg/dL (ref 0.2–1.2)
Total Protein: 5.5 g/dL — ABNORMAL LOW (ref 6.0–8.3)

## 2019-05-03 LAB — CBC WITH DIFFERENTIAL/PLATELET
Basophils Absolute: 0 10*3/uL (ref 0.0–0.1)
Basophils Relative: 0.4 % (ref 0.0–3.0)
Eosinophils Absolute: 0.1 10*3/uL (ref 0.0–0.7)
Eosinophils Relative: 1.6 % (ref 0.0–5.0)
HCT: 39.1 % (ref 36.0–46.0)
Hemoglobin: 12.9 g/dL (ref 12.0–15.0)
Lymphocytes Relative: 30.3 % (ref 12.0–46.0)
Lymphs Abs: 1.7 10*3/uL (ref 0.7–4.0)
MCHC: 33.1 g/dL (ref 30.0–36.0)
MCV: 88.3 fl (ref 78.0–100.0)
Monocytes Absolute: 0.4 10*3/uL (ref 0.1–1.0)
Monocytes Relative: 7.4 % (ref 3.0–12.0)
Neutro Abs: 3.4 10*3/uL (ref 1.4–7.7)
Neutrophils Relative %: 60.3 % (ref 43.0–77.0)
Platelets: 244 10*3/uL (ref 150.0–400.0)
RBC: 4.43 Mil/uL (ref 3.87–5.11)
RDW: 13.5 % (ref 11.5–15.5)
WBC: 5.7 10*3/uL (ref 4.0–10.5)

## 2019-05-03 LAB — HEMOGLOBIN A1C: Hgb A1c MFr Bld: 6.2 % (ref 4.6–6.5)

## 2019-05-03 LAB — TSH: TSH: 4 u[IU]/mL (ref 0.35–4.50)

## 2019-05-03 LAB — LDL CHOLESTEROL, DIRECT: Direct LDL: 154 mg/dL

## 2019-05-03 NOTE — Progress Notes (Signed)
PCP notes:   Health maintenance:  No gaps identified.  Abnormal screenings:   Fall risk - hx of single fall Fall Risk  05/03/2019 04/19/2018 04/19/2017 03/23/2016 03/21/2015  Falls in the past year? 1 Yes No No No  Comment fell after being jumped on by dog 2 falls due to loss of balance while doing yard work - - -  Number falls in past yr: 0 2 or more - - -  Injury with Fall? 1 No - - -    Patient concerns:   None  Nurse concerns:  None  Next PCP appt:   05/24/19 @ 0830  I reviewed health advisor's note, was available for consultation, and agree with documentation and plan. Loura Pardon MD

## 2019-05-03 NOTE — Telephone Encounter (Signed)
-----   Message from Ellamae Sia sent at 05/03/2019  7:56 AM EDT ----- Regarding: Lab orders for now Lab orders for today

## 2019-05-03 NOTE — Progress Notes (Signed)
Subjective:   Brianna [name] is a 73 y.o. female who presents for Medicare Annual (Subsequent) preventive examination.  Review of Systems:  N/A Cardiac Risk Factors include: advanced age (>63men, >70 women);dyslipidemia     Objective:     Vitals: Wt 157 lb (71.2 kg)   BMI 28.72 kg/m   Body mass index is 28.72 kg/m.  Advanced Directives 05/03/2019 04/19/2018 04/19/2017 03/23/2016 12/30/2015 03/05/2015 12/26/2014  Does Patient Have a Medical Advance Directive? No No No No No No No  Would patient like information on creating a medical advance directive? No - Patient declined No - Patient declined - Yes - Scientist, clinical (histocompatibility and immunogenetics) given - No - patient declined information -    Tobacco Social History   Tobacco Use  Smoking Status Never Smoker  Smokeless Tobacco Never Used     Counseling given: No   Clinical Intake:  Pre-visit preparation completed: Yes  Pain : 0-10 Pain Score: 2  Pain Type: Acute pain Pain Location: Other (Comment)(sinus)     Nutritional Status: BMI 25 -29 Overweight Nutritional Risks: Unintentional weight gain  How often do you need to have someone help you when you read instructions, pamphlets, or other written materials from your doctor or pharmacy?: 1 - Never     Information entered by :: LPinson, RN  Past Medical History:  Diagnosis Date  . Allergic rhinitis   . Breast carcinoma ([place]) 10/11   right- eventual double mastectomy in 2012  . GERD (gastroesophageal reflux disease)   . Hyperlipidemia   . Osteoporosis    fosamax started 9/08  . Shingles   . Vitamin D deficiency    Past Surgical History:  Procedure Laterality Date  . BREAST BIOPSY  10/11   invasive and in situ mam carcinoma  . BREAST LUMPECTOMY  11/11  . CATARACT EXTRACTION     does not remember when  . MASTECTOMY  1/12   double- Dr. Donne Hazel  . MASTECTOMY, RADICAL Bilateral 11/2010  . PARTIAL HYSTERECTOMY  1980's   non cancer, ovaries intact  . WRIST FRACTURE SURGERY Left    in the 1990's   Family History  Problem Relation Age of Onset  . Dementia Mother   . Cancer Father        esophageal  . Diabetes Brother   . Cancer Paternal Aunt        ovarian   Social History   Socioeconomic History  . Marital status: Single    Spouse name: n/a  . Number of children: 1  . Years of education: Not on file  . Highest education level: Not on file  Occupational History  . Occupation: retired    Fish farm manager: [employer]  Social Needs  . Financial resource strain: Not on file  . Food insecurity    Worry: Not on file    Inability: Not on file  . Transportation needs    Medical: Not on file    Non-medical: Not on file  Tobacco Use  . Smoking status: Never Smoker  . Smokeless tobacco: Never Used  Substance and Sexual Activity  . Alcohol use: Yes    Alcohol/week: 0.0 standard drinks    Comment: wine rarely  . Drug use: No  . Sexual activity: Not Currently  Lifestyle  . Physical activity    Days per week: Not on file    Minutes per session: Not on file  . Stress: Not on file  Relationships  . Social Herbalist on phone:  Not on file    Gets together: Not on file    Attends religious service: Not on file    Active member of club or organization: Not on file    Attends meetings of clubs or organizations: Not on file    Relationship status: Not on file  Other Topics Concern  . Not on file  Social History Narrative  . Not on file    Outpatient Encounter Medications as of 05/03/2019  Medication Sig  . Calcium Carbonate-Vitamin D (CALCIUM-VITAMIN D) 600-200 MG-UNIT CAPS Take 2 by mouth daily   . Cholecalciferol (VITAMIN D3 PO) Take 250 mcg by mouth daily.  . Cyanocobalamin (B-12) 2000 MCG TABS Take 1 tablet by mouth daily.  . fexofenadine (ALLEGRA) 180 MG tablet Take otc as directed  . hydrochlorothiazide (HYDRODIURIL) 12.5 MG tablet Take 12.5 mg by mouth as needed (takes PRN for swelling).  . Multiple Vitamin (MULTIVITAMIN) tablet Take  1 tablet by mouth daily.    Marland Kitchen omeprazole (PRILOSEC) 20 MG capsule TAKE 1 CAPSULE (20 MG TOTAL) BY MOUTH DAILY.   No facility-administered encounter medications on file as of 05/03/2019.     Activities of Daily Living In your present state of health, do you have any difficulty performing the following activities: 05/03/2019  Hearing? N  Vision? N  Difficulty concentrating or making decisions? N  Walking or climbing stairs? N  Dressing or bathing? N  Doing errands, shopping? N  Preparing Food and eating ? N  Using the Toilet? N  In the past six months, have you accidently leaked urine? N  Do you have problems with loss of bowel control? N  Managing your Medications? N  Managing your Finances? N  Housekeeping or managing your Housekeeping? N  Some recent data might be hidden    Patient Care Team: Tower, Wynelle Fanny, MD as PCP - General Marica Otter, OD as Consulting Physician (Optometry)    Assessment:   This is a routine wellness examination for [place].   Hearing Screening   125Hz  250Hz  500Hz  1000Hz  2000Hz  3000Hz  4000Hz  6000Hz  8000Hz   Right ear:           Left ear:           Vision Screening Comments: Vision exam approx. 2 yrs ago   Exercise Activities and Dietary recommendations Current Exercise Habits: The patient does not participate in regular exercise at present, Exercise limited by: None identified  Goals    . Patient Stated     Starting 05/03/2019, I will continue to take medications as prescribed.        Fall Risk Fall Risk  05/03/2019 04/19/2018 04/19/2017 03/23/2016 03/21/2015  Falls in the past year? 1 Yes No No No  Comment fell after being jumped on by dog 2 falls due to loss of balance while doing yard work - - -  Number falls in past yr: 0 2 or more - - -  Injury with Fall? 1 No - - -   Depression Screen PHQ 2/9 Scores 05/03/2019 04/19/2018 04/19/2017 03/23/2016  PHQ - 2 Score 0 0 0 0  PHQ- 9 Score 0 0 - -     Cognitive Function MMSE - Mini Mental State Exam  05/03/2019 04/19/2018 04/19/2017 03/23/2016  Orientation to time 5 5 5 5   Orientation to Place 5 5 5 5   Registration 3 3 3 3   Attention/ Calculation 0 0 0 0  Recall 3 3 3 2   Language- name 2 objects 0 0 0 0  Language- repeat 1 1 1 1   Language- follow 3 step command 0 3 3 3   Language- read & follow direction 0 0 0 0  Write a sentence 0 0 0 0  Copy design 0 0 0 0  Total score 17 20 20 19      PLEASE NOTE: A Mini-Cog screen was completed. Maximum score is 17. A value of 0 denotes this part of [place] MMSE was not completed or the patient failed this part of the Mini-Cog screening.   Mini-Cog Screening Orientation to Time - Max 5 pts Orientation to Place - Max 5 pts Registration - Max 3 pts Recall - Max 3 pts Language Repeat - Max 1 pts      Immunization History  Administered Date(s) Administered  . Influenza Split 11/05/2011, 11/06/2012  . Influenza Whole 08/22/2008, 10/13/2009, 09/04/2010  . Influenza,inj,Quad PF,6+ Mos 08/29/2013, 10/06/2015, 08/13/2016  . PPD Test 08/29/2013  . Pneumococcal Conjugate-13 03/19/2014  . Pneumococcal Polysaccharide-23 10/21/2010  . Td 03/04/1999, 10/13/2009    Screening Tests Health Maintenance  Topic Date Due  . INFLUENZA VACCINE  06/23/2019  . TETANUS/TDAP  10/14/2019  . COLONOSCOPY  12/09/2024  . DEXA SCAN  Completed  . Hepatitis C Screening  Completed  . PNA vac Low Risk Adult  Completed      Plan:     I have personally reviewed, addressed, and noted the following in the patient's chart:  A. Medical and social history B. Use of alcohol, tobacco or illicit drugs  C. Current medications and supplements D. Functional ability and status E.  Nutritional status F.  Physical activity G. Advance directives H. List of other physicians I.  Hospitalizations, surgeries, and ER visits in previous 12 months J.  Vitals (unless it is a telemedicine encounter) K. Screenings to include cognitive, depression, hearing, vision (NOTE: hearing and  vision screenings not completed in telemedicine encounter) L. Referrals and appointments   In addition, I have reviewed and discussed with patient certain preventive protocols, quality metrics, and best practice recommendations. A written personalized care plan for preventive services and recommendations were provided to patient.  With patient's permission, we connected on 05/03/19 at  8:00 AM EDT. Interactive audio and video telecommunications were attempted with patient. This attempt was unsuccessful due to patient having technical difficulties OR patient did not have access to video capability.  Encounter was completed with audio only.  Two patient identifiers were used to ensure the encounter occurred with the correct person. Patient was in home and writer was in office.   Signed,   Lindell Noe, MHA, BS, RN Health Coach

## 2019-05-03 NOTE — Patient Instructions (Signed)
Brianna [name] , Thank you for taking time to come for your Medicare Wellness Visit. I appreciate your ongoing commitment to your health goals. Please review the following plan we discussed and let me know if I can assist you in the future.   These are the goals we discussed: Goals    . Patient Stated     Starting 05/03/2019, I will continue to take medications as prescribed.        This is a list of the screening recommended for you and due dates:  Health Maintenance  Topic Date Due  . Flu Shot  06/23/2019  . Tetanus Vaccine  10/14/2019  . Colon Cancer Screening  12/09/2024  . DEXA scan (bone density measurement)  Completed  .  Hepatitis C: One time screening is recommended by Center for Disease Control  (CDC) for  adults born from [birth year] through 1965.   Completed  . Pneumonia vaccines  Completed   Preventive Care for Adults  A healthy lifestyle and preventive care can promote health and wellness. Preventive health guidelines for adults include the following key practices.  . A routine yearly physical is a good way to check with your health care provider about your health and preventive screening. It is a chance to share any concerns and updates on your health and to receive a thorough exam.  . Visit your dentist for a routine exam and preventive care every 6 months. Brush your teeth twice a day and floss once a day. Good oral hygiene prevents tooth decay and gum disease.  . The frequency of eye exams is based on your age, health, family medical history, use  of contact lenses, and other factors. Follow your health care provider's recommendations for frequency of eye exams.  . Eat a healthy diet. Foods like vegetables, fruits, whole grains, low-fat dairy products, and lean protein foods contain the nutrients you need without too many calories. Decrease your intake of foods high in solid fats, added sugars, and salt. Eat the right amount of calories for you. Get information about a proper  diet from your health care provider, if necessary.  . Regular physical exercise is one of the most important things you can do for your health. Most adults should get at least 150 minutes of moderate-intensity exercise (any activity that increases your heart rate and causes you to sweat) each week. In addition, most adults need muscle-strengthening exercises on 2 or more days a week.  Silver Sneakers may be a benefit available to you. To determine eligibility, you may visit the website: www.silversneakers.com or contact program at 716-534-8401 Mon-Fri between 8AM-8PM.   . Maintain a healthy weight. The body mass index (BMI) is a screening tool to identify possible weight problems. It provides an estimate of body fat based on height and weight. Your health care provider can find your BMI and can help you achieve or maintain a healthy weight.   For adults 20 years and older: ? A BMI below 18.5 is considered underweight. ? A BMI of 18.5 to 24.9 is normal. ? A BMI of 25 to 29.9 is considered overweight. ? A BMI of 30 and above is considered obese.   . Maintain normal blood lipids and cholesterol levels by exercising and minimizing your intake of saturated fat. Eat a balanced diet with plenty of fruit and vegetables. Blood tests for lipids and cholesterol should begin at age 67 and be repeated every 5 years. If your lipid or cholesterol levels are high, you are  over 65, or you are at high risk for heart disease, you may need your cholesterol levels checked more frequently. Ongoing high lipid and cholesterol levels should be treated with medicines if diet and exercise are not working.  . If you smoke, find out from your health care provider how to quit. If you do not use tobacco, please do not start.  . If you choose to drink alcohol, please do not consume more than 2 drinks per day. One drink is considered to be 12 ounces (355 mL) of beer, 5 ounces (148 mL) of wine, or 1.5 ounces (44 mL) of  liquor.  . If you are 53-74 years old, ask your health care provider if you should take aspirin to prevent strokes.  . Use sunscreen. Apply sunscreen liberally and repeatedly throughout the day. You should seek shade when your shadow is shorter than you. Protect yourself by wearing long sleeves, pants, a wide-brimmed hat, and sunglasses year round, whenever you are outdoors.  . Once a month, do a whole body skin exam, using a mirror to look at the skin on your back. Tell your health care provider of new moles, moles that have irregular borders, moles that are larger than a pencil eraser, or moles that have changed in shape or color.

## 2019-05-10 ENCOUNTER — Encounter: Payer: Medicare Other | Admitting: Family Medicine

## 2019-05-15 ENCOUNTER — Encounter: Payer: Medicare Other | Admitting: Family Medicine

## 2019-05-15 ENCOUNTER — Other Ambulatory Visit: Payer: Self-pay | Admitting: Family Medicine

## 2019-05-24 ENCOUNTER — Encounter: Payer: Self-pay | Admitting: Family Medicine

## 2019-05-24 ENCOUNTER — Other Ambulatory Visit: Payer: Self-pay

## 2019-05-24 ENCOUNTER — Ambulatory Visit (INDEPENDENT_AMBULATORY_CARE_PROVIDER_SITE_OTHER): Payer: Medicare Other | Admitting: Family Medicine

## 2019-05-24 VITALS — BP 140/82 | HR 81 | Temp 97.6°F | Ht 61.0 in | Wt 154.1 lb

## 2019-05-24 DIAGNOSIS — M810 Age-related osteoporosis without current pathological fracture: Secondary | ICD-10-CM | POA: Diagnosis not present

## 2019-05-24 DIAGNOSIS — Z853 Personal history of malignant neoplasm of breast: Secondary | ICD-10-CM | POA: Diagnosis not present

## 2019-05-24 DIAGNOSIS — E559 Vitamin D deficiency, unspecified: Secondary | ICD-10-CM

## 2019-05-24 DIAGNOSIS — E7800 Pure hypercholesterolemia, unspecified: Secondary | ICD-10-CM

## 2019-05-24 DIAGNOSIS — R7303 Prediabetes: Secondary | ICD-10-CM | POA: Diagnosis not present

## 2019-05-24 DIAGNOSIS — C50911 Malignant neoplasm of unspecified site of right female breast: Secondary | ICD-10-CM | POA: Diagnosis not present

## 2019-05-24 LAB — POCT CBG (FASTING - GLUCOSE)-MANUAL ENTRY: Glucose Fasting, POC: 164 mg/dL — AB (ref 70–99)

## 2019-05-24 MED ORDER — OMEPRAZOLE 20 MG PO CPDR: DELAYED_RELEASE_CAPSULE | ORAL | 3 refills | Status: DC

## 2019-05-24 NOTE — Patient Instructions (Signed)
Take care of yourself  Be safe  We will re check glucose   Stay active and eat well

## 2019-05-24 NOTE — Progress Notes (Addendum)
Subjective:    Patient ID: Brianna [name], female    DOB: [birth year], 73 y.o.   MRN: [id number]  HPI  Here for annual f/u of chronic health problems Feeling ok   Minding the quarantine and staying safe   Wt Readings from Last 3 Encounters:  05/24/19 154 lb 1 oz (69.9 kg)  05/03/19 157 lb (71.2 kg)  02/01/19 157 lb (71.2 kg)   29.11 kg/m   Getting outdoors Working in yard  Eating well - has a garden   BP Readings from Last 3 Encounters:  05/24/19 140/82  02/01/19 (!) 144/64  01/30/19 135/72  she takes hctz for edema   Pulse Readings from Last 3 Encounters:  05/24/19 81  02/01/19 84  01/30/19 78    amw on 6/11 Noted one fall / jumped on by a dog  No gaps or concerns   Colonoscopy 1/16  dexa 7/19 - osteoporosis  H/o breast cancer with tamoxifen in the past  Had full course of fosamax in the past  Disc prolia in the past /or evista = is on prolia twice yearly now and tolerating well   D level is 43.9 She takes it daily -ca plus D and extra D   H/o R breast cancer in the past (does not have it now and is not being treated currently)  S/p mastectomy (double)- does not do mammograms any more No mammograms or further f/u  Past tamoxifen-off now  No concerns   Lab Results  Component Value Date   CREATININE 1.04 05/03/2019   BUN 15 05/03/2019   NA 140 05/03/2019   K 4.4 05/03/2019   CL 105 05/03/2019   CO2 26 05/03/2019   Lab Results  Component Value Date   WBC 5.7 05/03/2019   HGB 12.9 05/03/2019   HCT 39.1 05/03/2019   MCV 88.3 05/03/2019   PLT 244.0 05/03/2019   Lab Results  Component Value Date   ALT 32 05/03/2019   AST 18 05/03/2019   ALKPHOS 58 05/03/2019   BILITOT 0.6 05/03/2019   Lab Results  Component Value Date   TSH 4.00 05/03/2019     Hyperlipidemia Lab Results  Component Value Date   CHOL 223 (H) 05/03/2019   CHOL 239 (H) 04/19/2018   CHOL 244 (H) 04/19/2017   Lab Results  Component Value Date   HDL 37.60 (L)  05/03/2019   HDL 42.00 04/19/2018   HDL 50.50 04/19/2017   Lab Results  Component Value Date   LDLCALC 161 (H) 04/19/2018   LDLCALC 166 (H) 04/19/2017   LDLCALC 119 (H) 03/16/2016   Lab Results  Component Value Date   TRIG 213.0 (H) 05/03/2019   TRIG 182.0 (H) 04/19/2018   TRIG 139.0 04/19/2017   Lab Results  Component Value Date   CHOLHDL 6 05/03/2019   CHOLHDL 6 04/19/2018   CHOLHDL 5 04/19/2017   Lab Results  Component Value Date   LDLDIRECT 154.0 05/03/2019   LDLDIRECT 119.9 10/30/2012   LDLDIRECT 136.2 11/02/2011   Intolerant of statin  zetia increased her LFTs-cannot take it  Eats a lot of cheese - has to watch that   Prediabetes Lab Results  Component Value Date   HGBA1C 6.2 05/03/2019  down from 6.3 Stable Watching diet   Patient Active Problem List   Diagnosis Date Noted  . Pre-employment examination 08/29/2013    Priority: Medium  . Thumb pain, left 01/30/2019  . Plantar fasciitis 06/19/2018  . Low back pain 06/19/2018  .  Abnormal urinalysis 06/19/2018  . Routine general medical examination at a health care facility 05/10/2018  . Chronic venous insufficiency 09/10/2016  . Pedal edema 08/13/2016  . Dyspepsia 04/12/2016  . Prediabetes 03/21/2015  . Nocturnal leg cramps 03/21/2015  . Estrogen deficiency 03/21/2015  . History of breast cancer 12/26/2014  . Encounter for Medicare annual wellness exam 02/28/2014  . Vitamin D deficiency 10/11/2008  . Hyperlipidemia 07/07/2007  . GERD 07/07/2007  . Osteoporosis 07/07/2007  . Fatty liver 07/07/2007   Past Medical History:  Diagnosis Date  . Allergic rhinitis   . Breast carcinoma ([place]) 10/11   right- eventual double mastectomy in 2012  . GERD (gastroesophageal reflux disease)   . Hyperlipidemia   . Osteoporosis    fosamax started 9/08  . Shingles   . Vitamin D deficiency    Past Surgical History:  Procedure Laterality Date  . BREAST BIOPSY  10/11   invasive and in situ mam carcinoma  .  BREAST LUMPECTOMY  11/11  . CATARACT EXTRACTION     does not remember when  . MASTECTOMY  1/12   double- Dr. Donne Hazel  . MASTECTOMY, RADICAL Bilateral 11/2010  . PARTIAL HYSTERECTOMY  1980's   non cancer, ovaries intact  . WRIST FRACTURE SURGERY Left    in the 1990's   Social History   Tobacco Use  . Smoking status: Never Smoker  . Smokeless tobacco: Never Used  Substance Use Topics  . Alcohol use: Yes    Alcohol/week: 0.0 standard drinks    Comment: wine rarely  . Drug use: No   Family History  Problem Relation Age of Onset  . Dementia Mother   . Cancer Father        esophageal  . Diabetes Brother   . Cancer Paternal Aunt        ovarian   Allergies  Allergen Reactions  . Pravachol     Myalgias  (As of visit on 05/17/2013, patient is taking Pravachol, but continues to have muscle pain)  . Zetia [Ezetimibe] Other (See Comments)    Ineffective, elevated liver enzymes   Current Outpatient Medications on File Prior to Visit  Medication Sig Dispense Refill  . Calcium Carbonate-Vitamin D (CALCIUM-VITAMIN D) 600-200 MG-UNIT CAPS Take 2 by mouth daily     . Cholecalciferol (VITAMIN D3 PO) Take 250 mcg by mouth daily.    . Cyanocobalamin (B-12) 2000 MCG TABS Take 1 tablet by mouth daily.    . fexofenadine (ALLEGRA) 180 MG tablet Take otc as directed    . hydrochlorothiazide (HYDRODIURIL) 12.5 MG tablet Take 12.5 mg by mouth as needed (takes PRN for swelling).    . Multiple Vitamin (MULTIVITAMIN) tablet Take 1 tablet by mouth daily.       No current facility-administered medications on file prior to visit.     Review of Systems  Constitutional: Negative for activity change, appetite change, fatigue, fever and unexpected weight change.  HENT: Negative for congestion, ear pain, rhinorrhea, sinus pressure and sore throat.   Eyes: Negative for pain, redness and visual disturbance.  Respiratory: Negative for cough, shortness of breath and wheezing.   Cardiovascular: Negative  for chest pain and palpitations.  Gastrointestinal: Negative for abdominal pain, blood in stool, constipation and diarrhea.  Endocrine: Negative for polydipsia and polyuria.  Genitourinary: Negative for dysuria, frequency and urgency.  Musculoskeletal: Negative for arthralgias, back pain and myalgias.       Aches and pains  Skin: Negative for pallor and rash.  Allergic/Immunologic: Negative  for environmental allergies.  Neurological: Negative for dizziness, syncope and headaches.  Hematological: Negative for adenopathy. Does not bruise/bleed easily.  Psychiatric/Behavioral: Negative for decreased concentration and dysphoric mood. The patient is not nervous/anxious.        Objective:   Physical Exam Constitutional:      General: She is not in acute distress.    Appearance: Normal appearance. She is well-developed. She is not ill-appearing.  HENT:     Head: Normocephalic and atraumatic.     Right Ear: External ear normal.     Left Ear: External ear normal.  Eyes:     General: No scleral icterus.    Conjunctiva/sclera: Conjunctivae normal.     Pupils: Pupils are equal, round, and reactive to light.  Neck:     Musculoskeletal: Normal range of motion and neck supple.     Thyroid: No thyromegaly.     Vascular: No carotid bruit or JVD.  Cardiovascular:     Rate and Rhythm: Normal rate and regular rhythm.     Heart sounds: Normal heart sounds. No gallop.   Pulmonary:     Effort: Pulmonary effort is normal. No respiratory distress.     Breath sounds: Normal breath sounds. No wheezing.  Chest:     Chest wall: No tenderness.  Abdominal:     General: Bowel sounds are normal. There is no distension or abdominal bruit.     Palpations: Abdomen is soft. There is no mass.     Tenderness: There is no abdominal tenderness.  Genitourinary:    Comments: S/p double mastectomy No residual breast tissue No axillary masses or discomfort Musculoskeletal: Normal range of motion.        General:  No tenderness.  Lymphadenopathy:     Cervical: No cervical adenopathy.  Skin:    General: Skin is warm and dry.     Coloration: Skin is not pale.     Findings: No erythema or rash.     Comments: Solar lentigines diffusely   Neurological:     Mental Status: She is alert.     Cranial Nerves: No cranial nerve deficit.     Motor: No abnormal muscle tone.     Coordination: Coordination normal.     Gait: Gait normal.     Deep Tendon Reflexes: Reflexes are normal and symmetric. Reflexes normal.  Psychiatric:        Mood and Affect: Mood normal.           Assessment & Plan:   Problem List Items Addressed This Visit      Musculoskeletal and Integument   Osteoporosis - Primary    dexa 7/19  Past hx of tamoxifen use  tx with prolia and doing well  One fall/no fx Taking ca and D Enc to exercise         Other   Vitamin D deficiency    D level is stable at 43.9  Vitamin D level is therapeutic with current supplementation Disc importance of this to bone and overall health       Hyperlipidemia    Disc goals for lipids and reasons to control them Rev last labs with pt Rev low sat fat diet in detail Intol of statin and cannot take zetia        Breast cancer, right breast ([place])    S/p tamoxifen and double mastectomy Doing well with no clinical changes       Prediabetes   Relevant Orders   Glucose (CBG), Fasting (Completed)

## 2019-05-27 NOTE — Assessment & Plan Note (Signed)
D level is stable at 43.9  Vitamin D level is therapeutic with current supplementation Disc importance of this to bone and overall health

## 2019-05-27 NOTE — Assessment & Plan Note (Signed)
Disc goals for lipids and reasons to control them Rev last labs with pt Rev low sat fat diet in detail Intol of statin and cannot take zetia

## 2019-05-27 NOTE — Assessment & Plan Note (Signed)
S/p tamoxifen and double mastectomy Doing well with no clinical changes

## 2019-05-27 NOTE — Assessment & Plan Note (Signed)
dexa 7/19  Past hx of tamoxifen use  tx with prolia and doing well  One fall/no fx Taking ca and D Enc to exercise

## 2019-09-14 ENCOUNTER — Telehealth: Payer: Self-pay

## 2019-09-14 NOTE — Telephone Encounter (Signed)
Discussed Prolia benefits w/pt.  Pt would owe approximately $0.  Pt scheduled for Prolia 10-23-2019.  Pt questions whether she can come in now before her Prolia or how long after her Prolia can she have flu vaccine?  Could she have on same day as Prolia

## 2019-09-14 NOTE — Telephone Encounter (Signed)
LMOM.  Need to schedule Prolia.

## 2019-09-16 NOTE — Telephone Encounter (Signed)
I do not think they have to be separated so it is up to her.   (flu shot is important)

## 2019-09-17 NOTE — Telephone Encounter (Signed)
I left a message on patient's voice mail to call back and schedule flu shot.

## 2019-09-27 ENCOUNTER — Ambulatory Visit (INDEPENDENT_AMBULATORY_CARE_PROVIDER_SITE_OTHER): Payer: Medicare Other

## 2019-09-27 DIAGNOSIS — Z23 Encounter for immunization: Secondary | ICD-10-CM | POA: Diagnosis not present

## 2019-10-23 ENCOUNTER — Ambulatory Visit (INDEPENDENT_AMBULATORY_CARE_PROVIDER_SITE_OTHER): Payer: Medicare Other

## 2019-10-23 ENCOUNTER — Other Ambulatory Visit: Payer: Self-pay

## 2019-10-23 DIAGNOSIS — M810 Age-related osteoporosis without current pathological fracture: Secondary | ICD-10-CM

## 2019-10-23 MED ORDER — DENOSUMAB 60 MG/ML ~~LOC~~ SOSY
60.0000 mg | PREFILLED_SYRINGE | Freq: Once | SUBCUTANEOUS | Status: AC
  Administered 2019-10-23: 60 mg via SUBCUTANEOUS

## 2019-10-23 NOTE — Progress Notes (Signed)
Pt received Prolia injection today. Tolerated well.

## 2019-10-30 ENCOUNTER — Encounter: Payer: Self-pay | Admitting: Family Medicine

## 2019-10-30 ENCOUNTER — Telehealth: Payer: Self-pay

## 2019-10-30 NOTE — Telephone Encounter (Signed)
I changed the diagnosis in her chart problem list to history of breast cancer since she is not now being treated.  It is the prior h/o tamoxifen use that puts her in the treatment zone for Prolia (generally) so that is still included in the description of the problem  Hope that helps   If I need to also addend a visit please let me know

## 2019-10-30 NOTE — Telephone Encounter (Signed)
I received a phone call from patient stating that her insurance is going up a large amount, because it was coded that she has a current diagnoses of breast cancer at an office visit she had on 05/24/19. She is asking that the code be changed to reflect a history of breast cancer. Patient states she is going to contact her insurance company & the billing department also regarding this issue, because she does not want the code change to affect her ability to receive her Prolia injection. I advised patient I would send a note to Dr. [name] & [place] to see what can be done, and I advised patient to call back if she received any information regarding this and her prolia injection still being covered. Thanks!

## 2019-10-30 NOTE — Telephone Encounter (Signed)
Error - duplicate

## 2019-10-31 NOTE — Telephone Encounter (Signed)
Will route to [place] to f/u with billing issue

## 2019-11-01 NOTE — Telephone Encounter (Signed)
I changed it in the problem list.   Do I need to go back and addend the note?

## 2019-11-01 NOTE — Telephone Encounter (Signed)
I reached out to [facility]. She did not see where the code was changed. She stated it is coded now as she currently has breast cancer and the insurance has already paid.      Does the patient have Breast cancer currently or is she on a cancer drug still or under continual care for breast cancer? If not, it should be coded as personal history of breast cancer and we should send in a corrected claim.

## 2019-11-07 NOTE — Telephone Encounter (Signed)
No cancer now  She was treated with surgery and tamoxifen in the past (no medication now)

## 2019-11-07 NOTE — Telephone Encounter (Signed)
Does patient have breast cancer now , or being treated with cancer drug?

## 2019-11-14 NOTE — Telephone Encounter (Signed)
I spoke with the patient to inform her that the codes and records have been updated. Patient came in to sign a ROI to fax records to [facility] [phone].

## 2019-11-14 NOTE — Telephone Encounter (Signed)
I did them.   Thanks so much!

## 2019-11-14 NOTE — Telephone Encounter (Signed)
Am I okay to call the patient and let her know the corrections have been made or do you still need to make the addendums?  Charge corrections has already changed the codes for the dos below.    "Diagnosis correction for below dates of services from C50.911 to Z85.3- Please refile a corrected claims for these service dates.  I am having the provider correct with an addendum to the notes stating that patient did not have breast cancer nor was being treated on these dates of service.  DOS 04/20/17, 05/10/18, 01/30/2019, 05/24/2019"

## 2020-01-18 ENCOUNTER — Telehealth: Payer: Self-pay

## 2020-01-18 NOTE — Telephone Encounter (Signed)
She certainly can if she wants to.   (most definitely if she tests positive and /or develops symptoms)  Please keep [place] updated

## 2020-01-18 NOTE — Telephone Encounter (Signed)
01-15-20 got 1st covid vaccine. Same day she was advised that a co-worker went home with "sinus infection". Advised yesterday that she was exposed to Covid by this co-worker. They set her up for testing on 2-28 and to quarantine until March 9th.  She was in close contact with this person while eating lunch. Not 6 feet apart.   Went to church this past Wednesday to plan an upcoming program. They were all social distancing and were wearing masks.  She is more concerned that she may have exposed all those people at church now.   Asking does she tell those people at church of their possible exposure?

## 2020-01-18 NOTE — Telephone Encounter (Signed)
Pt notified of Dr. Tower's comments and verbalized understanding  

## 2020-01-20 DIAGNOSIS — Z20828 Contact with and (suspected) exposure to other viral communicable diseases: Secondary | ICD-10-CM | POA: Diagnosis not present

## 2020-01-24 DIAGNOSIS — Z20828 Contact with and (suspected) exposure to other viral communicable diseases: Secondary | ICD-10-CM | POA: Diagnosis not present

## 2020-02-03 ENCOUNTER — Other Ambulatory Visit: Payer: Self-pay

## 2020-02-03 ENCOUNTER — Ambulatory Visit (HOSPITAL_COMMUNITY)
Admission: EM | Admit: 2020-02-03 | Discharge: 2020-02-03 | Disposition: A | Discharge status: Home / Self Care | Payer: Medicare Other | Attending: Family Medicine | Admitting: Family Medicine

## 2020-02-03 ENCOUNTER — Encounter (HOSPITAL_COMMUNITY): Payer: Self-pay

## 2020-02-03 DIAGNOSIS — N390 Urinary tract infection, site not specified: Secondary | ICD-10-CM

## 2020-02-03 LAB — POCT URINALYSIS DIP (DEVICE)
Bilirubin Urine: NEGATIVE
Glucose, UA: NEGATIVE mg/dL
Ketones, ur: NEGATIVE mg/dL
Nitrite: POSITIVE — AB
Protein, ur: 100 mg/dL — AB
Specific Gravity, Urine: 1.025 (ref 1.005–1.030)
Urobilinogen, UA: 0.2 mg/dL (ref 0.0–1.0)
pH: 5 (ref 5.0–8.0)

## 2020-02-03 MED ORDER — NITROFURANTOIN MONOHYD MACRO 100 MG PO CAPS: 100.0000 mg | ORAL_CAPSULE | Freq: Two times a day (BID) | ORAL | 0 refills | Status: DC

## 2020-02-03 MED ORDER — PHENAZOPYRIDINE HCL 200 MG PO TABS: 200.0000 mg | ORAL_TABLET | Freq: Three times a day (TID) | ORAL | 0 refills | Status: DC

## 2020-02-03 NOTE — ED Triage Notes (Signed)
Pt present painful and discomfort when urinating. Symptoms started 3 days ago.

## 2020-02-03 NOTE — ED Provider Notes (Signed)
[place]    CSN: [id number] Arrival date & time: 02/03/20  1451      History   Chief Complaint Chief Complaint  Patient presents with  . Urinary Frequency    HPI Brianna [name] is a 74 y.o. female.   HPI   Healthy 74 year old, here for UTI Has has many in the past Usually once a year Responds to antibiotics No kidney infection or symptoms, no fever or chills, no NVD, no flank pain Had double mastectomy for breast Ca Is doing well   Past Medical History:  Diagnosis Date  . Allergic rhinitis   . Breast carcinoma ([place]) 10/11   right- eventual double mastectomy in 2012  . GERD (gastroesophageal reflux disease)   . Hyperlipidemia   . Osteoporosis    fosamax started 9/08  . Shingles   . Vitamin D deficiency     Patient Active Problem List   Diagnosis Date Noted  . Thumb pain, left 01/30/2019  . Plantar fasciitis 06/19/2018  . Low back pain 06/19/2018  . Abnormal urinalysis 06/19/2018  . Routine general medical examination at a health care facility 05/10/2018  . Chronic venous insufficiency 09/10/2016  . Pedal edema 08/13/2016  . Dyspepsia 04/12/2016  . Prediabetes 03/21/2015  . Nocturnal leg cramps 03/21/2015  . Estrogen deficiency 03/21/2015  . History of breast cancer 12/26/2014  . Encounter for Medicare annual wellness exam 02/28/2014  . Pre-employment examination 08/29/2013  . Vitamin D deficiency 10/11/2008  . Hyperlipidemia 07/07/2007  . GERD 07/07/2007  . Osteoporosis 07/07/2007  . Fatty liver 07/07/2007    Past Surgical History:  Procedure Laterality Date  . BREAST BIOPSY  10/11   invasive and in situ mam carcinoma  . BREAST LUMPECTOMY  11/11  . CATARACT EXTRACTION     does not remember when  . MASTECTOMY  1/12   double- Dr. Donne Hazel  . MASTECTOMY, RADICAL Bilateral 11/2010  . PARTIAL HYSTERECTOMY  1980's   non cancer, ovaries intact  . WRIST FRACTURE SURGERY Left    in the 1990's    OB History   No obstetric history  on file.      Home Medications    Prior to Admission medications   Medication Sig Start Date End Date Taking? Authorizing Provider  Calcium Carbonate-Vitamin D (CALCIUM-VITAMIN D) 600-200 MG-UNIT CAPS Take 2 by mouth daily     [provider]  Cholecalciferol (VITAMIN D3 PO) Take 250 mcg by mouth daily.    [provider]  Cyanocobalamin (B-12) 2000 MCG TABS Take 1 tablet by mouth daily.    [provider]  fexofenadine (ALLEGRA) 180 MG tablet Take otc as directed    [provider]  hydrochlorothiazide (HYDRODIURIL) 12.5 MG tablet Take 12.5 mg by mouth as needed (takes PRN for swelling).    [provider]  Multiple Vitamin (MULTIVITAMIN) tablet Take 1 tablet by mouth daily.      [provider]  nitrofurantoin, macrocrystal-monohydrate, (MACROBID) 100 MG capsule Take 1 capsule (100 mg total) by mouth 2 (two) times daily. 02/03/20   Raylene Everts, MD  omeprazole (PRILOSEC) 20 MG capsule TAKE 1 CAPSULE BY MOUTH EVERY DAY 05/24/19   Tower, Wynelle Fanny, MD  phenazopyridine (PYRIDIUM) 200 MG tablet Take 1 tablet (200 mg total) by mouth 3 (three) times daily. 02/03/20   Raylene Everts, MD    Family History Family History  Problem Relation Age of Onset  . Dementia Mother   . Cancer Father  esophageal  . Diabetes Brother   . Cancer Paternal Aunt        ovarian    Social History Social History   Tobacco Use  . Smoking status: Never Smoker  . Smokeless tobacco: Never Used  Substance Use Topics  . Alcohol use: Yes    Alcohol/week: 0.0 standard drinks    Comment: wine rarely  . Drug use: No     Allergies   Pravachol and Zetia [ezetimibe]   Review of Systems Review of Systems  Genitourinary: Positive for dysuria and frequency. Negative for flank pain, hematuria and pelvic pain.     Physical Exam Triage Vital Signs ED Triage Vitals  Enc Vitals Group     BP 02/03/20 1546 (!) 139/58     Pulse Rate 02/03/20 1546  84     Resp 02/03/20 1546 16     Temp 02/03/20 1546 98.3 F (36.8 C)     Temp src --      SpO2 02/03/20 1546 100 %     Weight --      Height --      Head Circumference --      Peak Flow --      Pain Score 02/03/20 1545 5     Pain Loc --      Pain Edu? --      Excl. in [place]? --    No data found.  Updated Vital Signs BP (!) 139/58 (BP Location: Left Arm)   Pulse 84   Temp 98.3 F (36.8 C)   Resp 16   SpO2 100%   Physical Exam Constitutional:      General: She is not in acute distress.    Appearance: She is well-developed and normal weight.  HENT:     Head: Normocephalic and atraumatic.     Mouth/Throat:     Comments: mask Eyes:     Conjunctiva/sclera: Conjunctivae normal.     Pupils: Pupils are equal, round, and reactive to light.  Cardiovascular:     Rate and Rhythm: Normal rate.  Pulmonary:     Effort: Pulmonary effort is normal. No respiratory distress.  Abdominal:     Tenderness: There is no right CVA tenderness or left CVA tenderness.  Musculoskeletal:        General: Normal range of motion.     Cervical back: Normal range of motion.  Skin:    General: Skin is warm and dry.  Neurological:     Mental Status: She is alert.  Psychiatric:        Mood and Affect: Mood normal.        Behavior: Behavior normal.     Comments: pleasant      UC Treatments / Results  Labs (all labs ordered are listed, but only abnormal results are displayed) Labs Reviewed  POCT URINALYSIS DIP (DEVICE) - Abnormal; Notable for the following components:      Result Value   Hgb urine dipstick LARGE (*)    Protein, ur 100 (*)    Nitrite POSITIVE (*)    Leukocytes,Ua SMALL (*)    All other components within normal limits  URINE CULTURE    EKG   Radiology No results found.  Procedures Procedures (including critical care time)  Medications Ordered in UC Medications - No data to display  Initial Impression / Assessment and Plan / UC Course  I have reviewed the triage  vital signs and the nursing notes.  Pertinent labs & imaging results that were  available during my care of the patient were reviewed by me and considered in my medical decision making (see chart for details).     Urinalysis confirms suspicion of acute lower urinary tract infection Will treat with antibiotics.  Confirm that patient is not allergic Discussed use of Pyridium for urinary discomfort.  Discussed that it will stain the urine orange but will provide pain relief Follow-up with primary care doctor Can call to get her urine culture report in 2 to 3 days Final Clinical Impressions(s) / UC Diagnoses   Final diagnoses:  Lower urinary tract infectious disease     Discharge Instructions     Take the antibiotic 2 x a day until gone Take the pyridium 3 x a day as needed for pain This will stain your urine orange Drink more fluids Follow up with your PCP   ED Prescriptions    Medication Sig Dispense Auth. Provider   nitrofurantoin, macrocrystal-monohydrate, (MACROBID) 100 MG capsule Take 1 capsule (100 mg total) by mouth 2 (two) times daily. 10 capsule Raylene Everts, MD   phenazopyridine (PYRIDIUM) 200 MG tablet Take 1 tablet (200 mg total) by mouth 3 (three) times daily. 6 tablet Raylene Everts, MD     PDMP not reviewed this encounter.   Raylene Everts, MD 02/03/20 [phone]

## 2020-02-03 NOTE — Discharge Instructions (Signed)
Take the antibiotic 2 x a day until gone Take the pyridium 3 x a day as needed for pain This will stain your urine orange Drink more fluids Follow up with your PCP

## 2020-02-05 ENCOUNTER — Telehealth (HOSPITAL_COMMUNITY): Payer: Self-pay | Admitting: *Deleted

## 2020-02-05 LAB — URINE CULTURE
Culture: >=100000 — AB
Special Requests: NORMAL

## 2020-02-05 NOTE — Telephone Encounter (Signed)
Patient treated with macrobid. Patient called and results reviewed, was able to pick up rx and states she is feeling better. No questions.

## 2020-03-25 ENCOUNTER — Telehealth: Payer: Self-pay

## 2020-03-25 DIAGNOSIS — M810 Age-related osteoporosis without current pathological fracture: Secondary | ICD-10-CM

## 2020-03-25 NOTE — Telephone Encounter (Signed)
Pt due for Prolia 04-23-20. Pt has MCR and sup.  No PA required.  Deductible met.  Pt would owe approximately $0 for Prolia/admin fee.

## 2020-03-26 ENCOUNTER — Other Ambulatory Visit: Payer: Self-pay | Admitting: Family Medicine

## 2020-03-26 DIAGNOSIS — M810 Age-related osteoporosis without current pathological fracture: Secondary | ICD-10-CM

## 2020-03-26 NOTE — Addendum Note (Signed)
Addended by: Randall An on: 03/26/2020 02:53 PM   Modules accepted: Orders

## 2020-03-26 NOTE — Telephone Encounter (Signed)
Pt is scheduled for lab apt on 5/14 for BMP. Then pt will need to be scheduled for NV for Prolia after 6/2. Entered BMP order

## 2020-03-26 NOTE — Telephone Encounter (Signed)
LVM. Please transfer call to this nurse 

## 2020-04-01 ENCOUNTER — Telehealth: Payer: Self-pay | Admitting: Family Medicine

## 2020-04-01 DIAGNOSIS — M810 Age-related osteoporosis without current pathological fracture: Secondary | ICD-10-CM

## 2020-04-01 NOTE — Telephone Encounter (Signed)
-----   Message from Ellamae Sia sent at 03/26/2020  2:41 PM EDT ----- Regarding: Lab orders for Friday, 5.14.21 Please order a bmet for Prolia

## 2020-04-04 ENCOUNTER — Other Ambulatory Visit (INDEPENDENT_AMBULATORY_CARE_PROVIDER_SITE_OTHER): Payer: Medicare Other

## 2020-04-04 DIAGNOSIS — M810 Age-related osteoporosis without current pathological fracture: Secondary | ICD-10-CM | POA: Diagnosis not present

## 2020-04-04 LAB — BASIC METABOLIC PANEL
BUN: 11 mg/dL (ref 6–23)
CO2: 30 mEq/L (ref 19–32)
Calcium: 9.3 mg/dL (ref 8.4–10.5)
Chloride: 103 mEq/L (ref 96–112)
Creatinine, Ser: 0.92 mg/dL (ref 0.40–1.20)
GFR: 59.37 mL/min — ABNORMAL LOW (ref >60.00)
Glucose, Bld: 155 mg/dL — ABNORMAL HIGH (ref 70–99)
Potassium: 4.1 mEq/L (ref 3.5–5.1)
Sodium: 140 mEq/L (ref 135–145)

## 2020-04-07 NOTE — Telephone Encounter (Signed)
No answer and VM is full

## 2020-04-07 NOTE — Telephone Encounter (Signed)
LVM for pt. Pt needs scheduled for NV anytime after 6/2

## 2020-04-08 ENCOUNTER — Ambulatory Visit: Payer: PRIVATE HEALTH INSURANCE

## 2020-04-08 NOTE — Telephone Encounter (Signed)
Pt has been scheduled for prolia inj today but is not due until after 6/2. Pt needs to be RS to after 6/2. LVM for pt to call and RS

## 2020-04-24 ENCOUNTER — Ambulatory Visit (INDEPENDENT_AMBULATORY_CARE_PROVIDER_SITE_OTHER): Payer: Medicare Other

## 2020-04-24 DIAGNOSIS — M810 Age-related osteoporosis without current pathological fracture: Secondary | ICD-10-CM

## 2020-04-24 MED ORDER — DENOSUMAB 60 MG/ML ~~LOC~~ SOSY
60.0000 mg | PREFILLED_SYRINGE | Freq: Once | SUBCUTANEOUS | Status: AC
  Administered 2020-04-24: 60 mg via SUBCUTANEOUS

## 2020-04-24 NOTE — Progress Notes (Signed)
Per orders of Dr. Damita Dunnings in Dr. Marliss Coots absence, injection of Prolia given by Randall An. Patient tolerated injection well.

## 2020-05-27 ENCOUNTER — Ambulatory Visit: Payer: Medicare Other

## 2020-06-03 ENCOUNTER — Encounter: Payer: Medicare Other | Admitting: Family Medicine

## 2020-06-05 ENCOUNTER — Telehealth: Payer: Self-pay | Admitting: Family Medicine

## 2020-06-05 DIAGNOSIS — M810 Age-related osteoporosis without current pathological fracture: Secondary | ICD-10-CM

## 2020-06-05 DIAGNOSIS — E559 Vitamin D deficiency, unspecified: Secondary | ICD-10-CM

## 2020-06-05 DIAGNOSIS — I10 Essential (primary) hypertension: Secondary | ICD-10-CM

## 2020-06-05 DIAGNOSIS — G4762 Sleep related leg cramps: Secondary | ICD-10-CM

## 2020-06-05 DIAGNOSIS — R7303 Prediabetes: Secondary | ICD-10-CM

## 2020-06-05 DIAGNOSIS — E7800 Pure hypercholesterolemia, unspecified: Secondary | ICD-10-CM

## 2020-06-05 NOTE — Telephone Encounter (Signed)
-----   Message from Ellamae Sia sent at 05/23/2020 11:47 AM EDT ----- Regarding: Lab orders for Friday, 7.16.21  AWV lab orders, please.

## 2020-06-06 ENCOUNTER — Other Ambulatory Visit (INDEPENDENT_AMBULATORY_CARE_PROVIDER_SITE_OTHER): Payer: Medicare Other

## 2020-06-06 ENCOUNTER — Other Ambulatory Visit: Payer: Self-pay

## 2020-06-06 DIAGNOSIS — I10 Essential (primary) hypertension: Secondary | ICD-10-CM

## 2020-06-06 DIAGNOSIS — E559 Vitamin D deficiency, unspecified: Secondary | ICD-10-CM | POA: Diagnosis not present

## 2020-06-06 DIAGNOSIS — E7800 Pure hypercholesterolemia, unspecified: Secondary | ICD-10-CM | POA: Diagnosis not present

## 2020-06-06 DIAGNOSIS — G4762 Sleep related leg cramps: Secondary | ICD-10-CM

## 2020-06-06 DIAGNOSIS — R7303 Prediabetes: Secondary | ICD-10-CM | POA: Diagnosis not present

## 2020-06-06 LAB — COMPREHENSIVE METABOLIC PANEL
ALT: 34 U/L (ref 0–35)
AST: 23 U/L (ref 0–37)
Albumin: 4.1 g/dL (ref 3.5–5.2)
Alkaline Phosphatase: 52 U/L (ref 39–117)
BUN: 14 mg/dL (ref 6–23)
CO2: 31 mEq/L (ref 19–32)
Calcium: 9.5 mg/dL (ref 8.4–10.5)
Chloride: 103 mEq/L (ref 96–112)
Creatinine, Ser: 0.95 mg/dL (ref 0.40–1.20)
GFR: 57.18 mL/min — ABNORMAL LOW (ref >60.00)
Glucose, Bld: 135 mg/dL — ABNORMAL HIGH (ref 70–99)
Potassium: 4.8 mEq/L (ref 3.5–5.1)
Sodium: 139 mEq/L (ref 135–145)
Total Bilirubin: 0.6 mg/dL (ref 0.2–1.2)
Total Protein: 5.9 g/dL — ABNORMAL LOW (ref 6.0–8.3)

## 2020-06-06 LAB — CBC WITH DIFFERENTIAL/PLATELET
Basophils Absolute: 0 10*3/uL (ref 0.0–0.1)
Basophils Relative: 0.7 % (ref 0.0–3.0)
Eosinophils Absolute: 0.2 10*3/uL (ref 0.0–0.7)
Eosinophils Relative: 2.7 % (ref 0.0–5.0)
HCT: 38.8 % (ref 36.0–46.0)
Hemoglobin: 13.3 g/dL (ref 12.0–15.0)
Lymphocytes Relative: 32.7 % (ref 12.0–46.0)
Lymphs Abs: 2.1 10*3/uL (ref 0.7–4.0)
MCHC: 34.1 g/dL (ref 30.0–36.0)
MCV: 86.5 fl (ref 78.0–100.0)
Monocytes Absolute: 0.7 10*3/uL (ref 0.1–1.0)
Monocytes Relative: 10.5 % (ref 3.0–12.0)
Neutro Abs: 3.4 10*3/uL (ref 1.4–7.7)
Neutrophils Relative %: 53.4 % (ref 43.0–77.0)
Platelets: 300 10*3/uL (ref 150.0–400.0)
RBC: 4.49 Mil/uL (ref 3.87–5.11)
RDW: 13.4 % (ref 11.5–15.5)
WBC: 6.3 10*3/uL (ref 4.0–10.5)

## 2020-06-06 LAB — LIPID PANEL
Cholesterol: 203 mg/dL — ABNORMAL HIGH (ref 0–200)
HDL: 39.2 mg/dL (ref >39.00)
LDL Cholesterol: 137 mg/dL — ABNORMAL HIGH (ref 0–99)
NonHDL: 163.39
Total CHOL/HDL Ratio: 5
Triglycerides: 133 mg/dL (ref 0.0–149.0)
VLDL: 26.6 mg/dL (ref 0.0–40.0)

## 2020-06-06 LAB — VITAMIN D 25 HYDROXY (VIT D DEFICIENCY, FRACTURES): VITD: 63.68 ng/mL (ref 30.00–100.00)

## 2020-06-06 LAB — TSH: TSH: 3.36 u[IU]/mL (ref 0.35–4.50)

## 2020-06-06 LAB — HEMOGLOBIN A1C: Hgb A1c MFr Bld: 5.7 % (ref 4.6–6.5)

## 2020-06-11 ENCOUNTER — Telehealth: Payer: Self-pay | Admitting: Family Medicine

## 2020-06-11 ENCOUNTER — Ambulatory Visit (INDEPENDENT_AMBULATORY_CARE_PROVIDER_SITE_OTHER): Payer: Medicare Other | Admitting: Family Medicine

## 2020-06-11 ENCOUNTER — Other Ambulatory Visit: Payer: Self-pay

## 2020-06-11 ENCOUNTER — Encounter: Payer: Self-pay | Admitting: Family Medicine

## 2020-06-11 VITALS — BP 122/74 | HR 82 | Temp 97.3°F | Ht 60.0 in | Wt 140.0 lb

## 2020-06-11 DIAGNOSIS — E2839 Other primary ovarian failure: Secondary | ICD-10-CM

## 2020-06-11 DIAGNOSIS — E559 Vitamin D deficiency, unspecified: Secondary | ICD-10-CM

## 2020-06-11 DIAGNOSIS — E7800 Pure hypercholesterolemia, unspecified: Secondary | ICD-10-CM

## 2020-06-11 DIAGNOSIS — R7303 Prediabetes: Secondary | ICD-10-CM

## 2020-06-11 DIAGNOSIS — Z0000 Encounter for general adult medical examination without abnormal findings: Secondary | ICD-10-CM

## 2020-06-11 DIAGNOSIS — K760 Fatty (change of) liver, not elsewhere classified: Secondary | ICD-10-CM | POA: Diagnosis not present

## 2020-06-11 DIAGNOSIS — Z853 Personal history of malignant neoplasm of breast: Secondary | ICD-10-CM

## 2020-06-11 DIAGNOSIS — M810 Age-related osteoporosis without current pathological fracture: Secondary | ICD-10-CM | POA: Diagnosis not present

## 2020-06-11 DIAGNOSIS — I10 Essential (primary) hypertension: Secondary | ICD-10-CM | POA: Diagnosis not present

## 2020-06-11 MED ORDER — OMEPRAZOLE 20 MG PO CPDR: DELAYED_RELEASE_CAPSULE | ORAL | 3 refills | Status: DC

## 2020-06-11 NOTE — Assessment & Plan Note (Signed)
Due for 2 y dexa-ordered and she will schedule Taking prolia  No falls or fractures  Past tamoxifen  On vit D with good level and good exercise/activity

## 2020-06-11 NOTE — Assessment & Plan Note (Signed)
Lab Results  Component Value Date   HGBA1C 5.7 06/06/2020   Noted improvement with better diet and wt loss disc imp of low glycemic diet and wt loss to prevent DM2

## 2020-06-11 NOTE — Assessment & Plan Note (Signed)
With recent wt loss LFTs are normal

## 2020-06-11 NOTE — Assessment & Plan Note (Signed)
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

## 2020-06-11 NOTE — Assessment & Plan Note (Signed)
Disc goals for lipids and reasons to control them Rev last labs with pt Rev low sat fat diet in detail Intol of statin and zetia in the past LDL is down with better diet from 150s to 130s -commended

## 2020-06-11 NOTE — Assessment & Plan Note (Signed)
bp in fair control at this time  BP Readings from Last 1 Encounters:  06/11/20 122/74   No changes needed Most recent labs reviewed  Disc lifstyle change with low sodium diet and exercise

## 2020-06-11 NOTE — Progress Notes (Signed)
Subjective:    Patient ID: Brianna [name], female    DOB: 1946/09/15, 74 y.o.   MRN: [id number]  This visit occurred during the SARS-CoV-2 public health emergency.  Safety protocols were in place, including screening questions prior to the visit, additional usage of staff PPE, and extensive cleaning of exam room while observing appropriate contact time as indicated for disinfecting solutions.    HPI Pt presents for amw and f/u of chronic health conditions   I have personally reviewed the Medicare Annual Wellness questionnaire and have noted 1. The patient's medical and social history 2. Their use of alcohol, tobacco or illicit drugs 3. Their current medications and supplements 4. The patient's functional ability including ADL's, fall risks, home safety risks and hearing or visual             impairment. 5. Diet and physical activities 6. Evidence for depression or mood disorders  The patients weight, height, BMI have been recorded in the chart and visual acuity is per eye clinic.  I have made referrals, counseling and provided education to the patient based review of the above and I have provided the pt with a written personalized care plan for preventive services. Reviewed and updated provider list, see scanned forms.  See scanned forms.  Routine anticipatory guidance given to patient.  See health maintenance. Colon cancer screening colonoscopy 1/16  Breast cancer screening mammogram-not done bilat mastectomy , no changes in surgical sites  Flu vaccine-11/20 Tetanus vaccine 11/10- def for insurance  Pneumovax completed Hep C screen- completed  Zoster vaccine-interested if covered  covid vaccine-completed Dexa 7/19 with osteoporosis (past h/o tamoxifen use) tx with prolia  Falls-none Fractures-none  Supplements -vit D3 D level is 63  Exercise -a lot of walking   Advance directive-has not done yet-given materials to do  Cognitive function addressed- see scanned forms- and  if abnormal then additional documentation follows.   Memory is overall ok - does occ misplace things / walks into a room and forgets why    PMH and SH reviewed  Meds, vitals, and allergies reviewed.   ROS: See HPI.  Otherwise negative.    Back to working for [employer]  Depression screen score 0   Care team  [place]- PCP [facility]- oncology  Weight : Wt Readings from Last 3 Encounters:  06/11/20 140 lb (63.5 kg)  05/24/19 154 lb 1 oz (69.9 kg)  05/03/19 157 lb (71.2 kg)  has lost some weight  27.34 kg/m  Walking  Eating less and eating better (2 meals per day)   Hearing/vision:  Hearing Screening   125Hz  250Hz  500Hz  1000Hz  2000Hz  3000Hz  4000Hz  6000Hz  8000Hz   Right ear:   40 40 40  40    Left ear:   0 40 40  0      Visual Acuity Screening   Right eye Left eye Both eyes  Without correction: 20/50 20/50 20/40   With correction:     she notes the hearing loss  Not ready for hearing aide yet  Vision- over 2 years since eye appt    HTN bp is stable today  No cp or palpitations or headaches or edema  No side effects to medicines  BP Readings from Last 3 Encounters:  06/11/20 122/74  02/03/20 (!) 139/58  05/24/19 140/82     Pulse Readings from Last 3 Encounters:  06/11/20 82  02/03/20 84  05/24/19 81   Hyperlipidemia Lab Results  Component Value Date  CHOL 203 (H) 06/06/2020   CHOL 223 (H) 05/03/2019   CHOL 239 (H) 04/19/2018   Lab Results  Component Value Date   HDL 39.20 06/06/2020   HDL 37.60 (L) 05/03/2019   HDL 42.00 04/19/2018   Lab Results  Component Value Date   LDLCALC 137 (H) 06/06/2020   LDLCALC 161 (H) 04/19/2018   LDLCALC 166 (H) 04/19/2017   Lab Results  Component Value Date   TRIG 133.0 06/06/2020   TRIG 213.0 (H) 05/03/2019   TRIG 182.0 (H) 04/19/2018   Lab Results  Component Value Date   CHOLHDL 5 06/06/2020   CHOLHDL 6 05/03/2019   CHOLHDL 6 04/19/2018   Lab Results  Component  Value Date   LDLDIRECT 154.0 05/03/2019   LDLDIRECT 119.9 10/30/2012   LDLDIRECT 136.2 11/02/2011   intol of statin and zetia both  LDL is lower  Loves cheese- is mindful    Prediabetes Lab Results  Component Value Date   HGBA1C 5.7 06/06/2020   Down from 6.3  Lab Results  Component Value Date   TSH 3.36 06/06/2020   Lab Results  Component Value Date   WBC 6.3 06/06/2020   HGB 13.3 06/06/2020   HCT 38.8 06/06/2020   MCV 86.5 06/06/2020   PLT 300.0 06/06/2020   Lab Results  Component Value Date   ALT 34 06/06/2020   AST 23 06/06/2020   ALKPHOS 52 06/06/2020   BILITOT 0.6 06/06/2020   Lab Results  Component Value Date   CREATININE 0.95 06/06/2020   BUN 14 06/06/2020   NA 139 06/06/2020   K 4.8 06/06/2020   CL 103 06/06/2020   CO2 31 06/06/2020    Patient Active Problem List   Diagnosis Date Noted  . Pre-employment examination 08/29/2013    Priority: Medium  . Hypertension, essential 06/05/2020  . Thumb pain, left 01/30/2019  . Plantar fasciitis 06/19/2018  . Low back pain 06/19/2018  . Abnormal urinalysis 06/19/2018  . Routine general medical examination at a health care facility 05/10/2018  . Chronic venous insufficiency 09/10/2016  . Pedal edema 08/13/2016  . Dyspepsia 04/12/2016  . Prediabetes 03/21/2015  . Nocturnal leg cramps 03/21/2015  . Estrogen deficiency 03/21/2015  . History of breast cancer 12/26/2014  . Encounter for Medicare annual wellness exam 02/28/2014  . Vitamin D deficiency 10/11/2008  . Hyperlipidemia 07/07/2007  . GERD 07/07/2007  . Osteoporosis 07/07/2007  . Fatty liver 07/07/2007   Past Medical History:  Diagnosis Date  . Allergic rhinitis   . Breast carcinoma ([place]) 10/11   right- eventual double mastectomy in 2012  . GERD (gastroesophageal reflux disease)   . Hyperlipidemia   . Osteoporosis    fosamax started 9/08  . Shingles   . Vitamin D deficiency    Past Surgical History:  Procedure Laterality Date  . BREAST  BIOPSY  10/11   invasive and in situ mam carcinoma  . BREAST LUMPECTOMY  11/11  . CATARACT EXTRACTION     does not remember when  . MASTECTOMY  1/12   double- Dr. Donne Hazel  . MASTECTOMY, RADICAL Bilateral 11/2010  . PARTIAL HYSTERECTOMY  1980's   non cancer, ovaries intact  . WRIST FRACTURE SURGERY Left    in the 1990's   Social History   Tobacco Use  . Smoking status: Never Smoker  . Smokeless tobacco: Never Used  Vaping Use  . Vaping Use: Never used  Substance Use Topics  . Alcohol use: Yes    Alcohol/week: 0.0 standard drinks  Comment: wine rarely  . Drug use: No   Family History  Problem Relation Age of Onset  . Dementia Mother   . Cancer Father        esophageal  . Diabetes Brother   . Cancer Paternal Aunt        ovarian   Allergies  Allergen Reactions  . Pravachol     Myalgias  (As of visit on 05/17/2013, patient is taking Pravachol, but continues to have muscle pain)  . Zetia [Ezetimibe] Other (See Comments)    Ineffective, elevated liver enzymes   Current Outpatient Medications on File Prior to Visit  Medication Sig Dispense Refill  . Calcium Carbonate-Vitamin D (CALCIUM-VITAMIN D) 600-200 MG-UNIT CAPS Take 2 by mouth daily     . Cholecalciferol (VITAMIN D3 PO) Take 250 mcg by mouth daily.    . Cyanocobalamin (B-12) 2000 MCG TABS Take 1 tablet by mouth daily.    . fexofenadine (ALLEGRA) 180 MG tablet Take otc as directed    . hydrochlorothiazide (HYDRODIURIL) 12.5 MG tablet Take 12.5 mg by mouth as needed (takes PRN for swelling).    . Multiple Vitamin (MULTIVITAMIN) tablet Take 1 tablet by mouth daily.       No current facility-administered medications on file prior to visit.     Review of Systems  Constitutional: Negative for activity change, appetite change, fatigue, fever and unexpected weight change.  HENT: Negative for congestion, ear pain, rhinorrhea, sinus pressure and sore throat.   Eyes: Negative for pain, redness and visual disturbance.    Respiratory: Negative for cough, shortness of breath and wheezing.   Cardiovascular: Negative for chest pain and palpitations.  Gastrointestinal: Negative for abdominal pain, blood in stool, constipation and diarrhea.  Endocrine: Negative for polydipsia and polyuria.  Genitourinary: Negative for dysuria, frequency and urgency.  Musculoskeletal: Positive for arthralgias and back pain. Negative for myalgias.  Skin: Negative for pallor and rash.  Allergic/Immunologic: Negative for environmental allergies.  Neurological: Negative for dizziness, syncope and headaches.  Hematological: Negative for adenopathy. Does not bruise/bleed easily.  Psychiatric/Behavioral: Negative for decreased concentration and dysphoric mood. The patient is not nervous/anxious.        Objective:   Physical Exam Constitutional:      General: She is not in acute distress.    Appearance: Normal appearance. She is well-developed and normal weight. She is not ill-appearing or diaphoretic.  HENT:     Head: Normocephalic and atraumatic.     Right Ear: Tympanic membrane, ear canal and external ear normal.     Left Ear: Tympanic membrane, ear canal and external ear normal.     Nose: Nose normal. No congestion.     Mouth/Throat:     Mouth: Mucous membranes are moist.     Pharynx: Oropharynx is clear. No posterior oropharyngeal erythema.  Eyes:     General: No scleral icterus.    Extraocular Movements: Extraocular movements intact.     Conjunctiva/sclera: Conjunctivae normal.     Pupils: Pupils are equal, round, and reactive to light.  Neck:     Thyroid: No thyromegaly.     Vascular: No carotid bruit or JVD.  Cardiovascular:     Rate and Rhythm: Normal rate and regular rhythm.     Pulses: Normal pulses.     Heart sounds: Normal heart sounds. No gallop.   Pulmonary:     Effort: Pulmonary effort is normal. No respiratory distress.     Breath sounds: Normal breath sounds. No wheezing.  Comments: Good air  exch Chest:     Chest wall: No tenderness.  Abdominal:     General: Bowel sounds are normal. There is no distension or abdominal bruit.     Palpations: Abdomen is soft. There is no mass.     Tenderness: There is no abdominal tenderness.     Hernia: No hernia is present.  Genitourinary:    Comments: Bilateral mastectomy sites w/o lumps or changes Musculoskeletal:        General: No tenderness. Normal range of motion.     Cervical back: Normal range of motion and neck supple. No rigidity. No muscular tenderness.     Right lower leg: No edema.     Left lower leg: No edema.  Lymphadenopathy:     Cervical: No cervical adenopathy.  Skin:    General: Skin is warm and dry.     Coloration: Skin is not pale.     Findings: No erythema or rash.     Comments: Fair  Solar lentigines diffusely Some sks on trunk  Neurological:     Mental Status: She is alert. Mental status is at baseline.     Cranial Nerves: No cranial nerve deficit.     Motor: No abnormal muscle tone.     Coordination: Coordination normal.     Gait: Gait normal.     Deep Tendon Reflexes: Reflexes are normal and symmetric. Reflexes normal.  Psychiatric:        Mood and Affect: Mood normal.        Cognition and Memory: Cognition and memory normal.           Assessment & Plan:   Problem List Items Addressed This Visit      Cardiovascular and Mediastinum   Hypertension, essential    bp in fair control at this time  BP Readings from Last 1 Encounters:  06/11/20 122/74   No changes needed Most recent labs reviewed  Disc lifstyle change with low sodium diet and exercise          Digestive   Fatty liver    With recent wt loss LFTs are normal        Musculoskeletal and Integument   Osteoporosis    Due for 2 y dexa-ordered and she will schedule Taking prolia  No falls or fractures  Past tamoxifen  On vit D with good level and good exercise/activity        Other   Vitamin D deficiency    Vitamin D  level is therapeutic with current supplementation Disc importance of this to bone and overall health Level of 63       Hyperlipidemia    Disc goals for lipids and reasons to control them Rev last labs with pt Rev low sat fat diet in detail Intol of statin and zetia in the past LDL is down with better diet from 150s to 130s -commended        Encounter for [employer] annual wellness exam - Primary    Reviewed health habits including diet and exercise and skin cancer prevention Reviewed appropriate screening tests for age  Also reviewed health mt list, fam hx and immunization status , as well as social and family history   Labs reviewed  Signed up for cologuard for colon cancer screening  No mammogram due to double mastectomy  Def tetanus vaccine for ins/financial Interested in shingrix if covered Vaccinated for covid  dexa ordered  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

## 2020-06-11 NOTE — Assessment & Plan Note (Signed)
S/p bilat mastectomy  Nl exam today  No problems S/p tamoxifen in past- now taking prolia for OP

## 2020-06-11 NOTE — Assessment & Plan Note (Signed)
Vitamin D level is therapeutic with current supplementation Disc importance of this to bone and overall health Level of 63

## 2020-06-11 NOTE — Patient Instructions (Addendum)
I ordered the cologuard test for colon cancer screening   If you are interested in the new shingles vaccine (Shingrix) - call your local pharmacy to check on coverage and availability  If affordable, get on a wait list at your pharmacy to get the vaccine.  I ordered your bone density test so you can call and set it up   Please do work on your advance directive (blue packet)  Look for the book [employer] [place] by Coral Spikes   Get your vision exam

## 2020-06-11 NOTE — Telephone Encounter (Signed)
Patient called office to inform us on when she got her covid vaccines. She got the [place] vaccine on January 15 2020 for her first dose and her second dose on March 16th 2021.

## 2020-06-11 NOTE — Telephone Encounter (Signed)
Chart updated

## 2020-06-20 ENCOUNTER — Ambulatory Visit (INDEPENDENT_AMBULATORY_CARE_PROVIDER_SITE_OTHER)
Admission: RE | Admit: 2020-06-20 | Discharge: 2020-06-20 | Disposition: A | Discharge status: Home / Self Care | Payer: Medicare Other | Source: Ambulatory Visit | Attending: Family Medicine | Admitting: Family Medicine

## 2020-06-20 ENCOUNTER — Other Ambulatory Visit: Payer: Self-pay

## 2020-06-20 DIAGNOSIS — E2839 Other primary ovarian failure: Secondary | ICD-10-CM | POA: Diagnosis not present

## 2020-06-23 ENCOUNTER — Encounter: Payer: Self-pay | Admitting: *Deleted

## 2020-06-25 DIAGNOSIS — Z1211 Encounter for screening for malignant neoplasm of colon: Secondary | ICD-10-CM | POA: Diagnosis not present

## 2020-06-25 DIAGNOSIS — Z1212 Encounter for screening for malignant neoplasm of rectum: Secondary | ICD-10-CM | POA: Diagnosis not present

## 2020-06-26 LAB — COLOGUARD: Cologuard: NEGATIVE

## 2020-07-01 LAB — COLOGUARD: COLOGUARD: NEGATIVE

## 2020-07-03 ENCOUNTER — Encounter: Payer: Self-pay | Admitting: Family Medicine

## 2020-07-17 DIAGNOSIS — H04123 Dry eye syndrome of bilateral lacrimal glands: Secondary | ICD-10-CM | POA: Diagnosis not present

## 2020-07-17 DIAGNOSIS — H524 Presbyopia: Secondary | ICD-10-CM | POA: Diagnosis not present

## 2020-07-17 DIAGNOSIS — H353121 Nonexudative age-related macular degeneration, left eye, early dry stage: Secondary | ICD-10-CM | POA: Diagnosis not present

## 2020-07-17 DIAGNOSIS — H5211 Myopia, right eye: Secondary | ICD-10-CM | POA: Diagnosis not present

## 2020-07-17 DIAGNOSIS — H5202 Hypermetropia, left eye: Secondary | ICD-10-CM | POA: Diagnosis not present

## 2020-07-17 DIAGNOSIS — H353132 Nonexudative age-related macular degeneration, bilateral, intermediate dry stage: Secondary | ICD-10-CM | POA: Diagnosis not present

## 2020-07-17 DIAGNOSIS — Z961 Presence of intraocular lens: Secondary | ICD-10-CM | POA: Diagnosis not present

## 2020-07-17 DIAGNOSIS — H52222 Regular astigmatism, left eye: Secondary | ICD-10-CM | POA: Diagnosis not present

## 2020-07-17 DIAGNOSIS — H353113 Nonexudative age-related macular degeneration, right eye, advanced atrophic without subfoveal involvement: Secondary | ICD-10-CM | POA: Diagnosis not present

## 2020-07-17 DIAGNOSIS — H43813 Vitreous degeneration, bilateral: Secondary | ICD-10-CM | POA: Diagnosis not present

## 2020-07-25 ENCOUNTER — Other Ambulatory Visit: Payer: Self-pay

## 2020-07-25 ENCOUNTER — Ambulatory Visit (HOSPITAL_COMMUNITY)
Admission: EM | Admit: 2020-07-25 | Discharge: 2020-07-25 | Disposition: A | Discharge status: Home / Self Care | Payer: Medicare Other | Attending: Internal Medicine | Admitting: Internal Medicine

## 2020-07-25 ENCOUNTER — Encounter (HOSPITAL_COMMUNITY): Payer: Self-pay

## 2020-07-25 DIAGNOSIS — Z20822 Contact with and (suspected) exposure to covid-19: Secondary | ICD-10-CM | POA: Insufficient documentation

## 2020-07-25 DIAGNOSIS — R197 Diarrhea, unspecified: Secondary | ICD-10-CM | POA: Insufficient documentation

## 2020-07-25 DIAGNOSIS — R7303 Prediabetes: Secondary | ICD-10-CM | POA: Diagnosis not present

## 2020-07-25 DIAGNOSIS — E559 Vitamin D deficiency, unspecified: Secondary | ICD-10-CM | POA: Diagnosis not present

## 2020-07-25 DIAGNOSIS — Z9013 Acquired absence of bilateral breasts and nipples: Secondary | ICD-10-CM | POA: Insufficient documentation

## 2020-07-25 DIAGNOSIS — K760 Fatty (change of) liver, not elsewhere classified: Secondary | ICD-10-CM | POA: Diagnosis not present

## 2020-07-25 DIAGNOSIS — Z888 Allergy status to other drugs, medicaments and biological substances status: Secondary | ICD-10-CM | POA: Diagnosis not present

## 2020-07-25 DIAGNOSIS — Z853 Personal history of malignant neoplasm of breast: Secondary | ICD-10-CM | POA: Insufficient documentation

## 2020-07-25 DIAGNOSIS — R0982 Postnasal drip: Secondary | ICD-10-CM | POA: Diagnosis not present

## 2020-07-25 DIAGNOSIS — M810 Age-related osteoporosis without current pathological fracture: Secondary | ICD-10-CM | POA: Insufficient documentation

## 2020-07-25 DIAGNOSIS — Z79899 Other long term (current) drug therapy: Secondary | ICD-10-CM | POA: Diagnosis not present

## 2020-07-25 DIAGNOSIS — I872 Venous insufficiency (chronic) (peripheral): Secondary | ICD-10-CM | POA: Insufficient documentation

## 2020-07-25 DIAGNOSIS — K219 Gastro-esophageal reflux disease without esophagitis: Secondary | ICD-10-CM | POA: Insufficient documentation

## 2020-07-25 DIAGNOSIS — I10 Essential (primary) hypertension: Secondary | ICD-10-CM | POA: Insufficient documentation

## 2020-07-25 DIAGNOSIS — E785 Hyperlipidemia, unspecified: Secondary | ICD-10-CM | POA: Insufficient documentation

## 2020-07-25 NOTE — ED Provider Notes (Signed)
[facility]    CSN: [id number] Arrival date & time: 07/25/20  1053      History   Chief Complaint No chief complaint on file.   HPI Brianna [name] is a 74 y.o. female with hx of HTN.   HPI   Patient reports around 4AM she woke up with upset stomach.  She had diarrhea and headache. Took Tylenol for headache. Had 3 episodes of non-bloody diarrhea.  Ate around 8:30 AM and symptoms have resolved.  Has sinus pain on the right maximally region. Denies fever, sore throat/scratchy, cough. Endorses mild headache, post nasal drip and sneezing which is not new for her. Patient is COVID vaccinated.   Past Medical History:  Diagnosis Date  . Allergic rhinitis   . Breast carcinoma ([place]) 10/11   right- eventual double mastectomy in 2012  . GERD (gastroesophageal reflux disease)   . Hyperlipidemia   . Osteoporosis    fosamax started 9/08  . Shingles   . Vitamin D deficiency     Patient Active Problem List   Diagnosis Date Noted  . Hypertension, essential 06/05/2020  . Thumb pain, left 01/30/2019  . Plantar fasciitis 06/19/2018  . Low back pain 06/19/2018  . Abnormal urinalysis 06/19/2018  . Routine general medical examination at a health care facility 05/10/2018  . Chronic venous insufficiency 09/10/2016  . Pedal edema 08/13/2016  . Dyspepsia 04/12/2016  . Prediabetes 03/21/2015  . Nocturnal leg cramps 03/21/2015  . Estrogen deficiency 03/21/2015  . History of breast cancer 12/26/2014  . Encounter for Medicare annual wellness exam 02/28/2014  . Pre-employment examination 08/29/2013  . Vitamin D deficiency 10/11/2008  . Hyperlipidemia 07/07/2007  . GERD 07/07/2007  . Osteoporosis 07/07/2007  . Fatty liver 07/07/2007    Past Surgical History:  Procedure Laterality Date  . BREAST BIOPSY  10/11   invasive and in situ mam carcinoma  . BREAST LUMPECTOMY  11/11  . CATARACT EXTRACTION     does not remember when  . MASTECTOMY  1/12   double- Dr. Donne Hazel  .  MASTECTOMY, RADICAL Bilateral 11/2010  . PARTIAL HYSTERECTOMY  1980's   non cancer, ovaries intact  . WRIST FRACTURE SURGERY Left    in the 1990's    OB History   No obstetric history on file.      Home Medications    Prior to Admission medications   Medication Sig Start Date End Date Taking? Authorizing Provider  Calcium Carbonate-Vitamin D (CALCIUM-VITAMIN D) 600-200 MG-UNIT CAPS Take 2 by mouth daily     [provider]  Cholecalciferol (VITAMIN D3 PO) Take 250 mcg by mouth daily.    [provider]  Cyanocobalamin (B-12) 2000 MCG TABS Take 1 tablet by mouth daily.    [provider]  fexofenadine (ALLEGRA) 180 MG tablet Take otc as directed    [provider]  hydrochlorothiazide (HYDRODIURIL) 12.5 MG tablet Take 12.5 mg by mouth as needed (takes PRN for swelling).    [provider]  Multiple Vitamin (MULTIVITAMIN) tablet Take 1 tablet by mouth daily.      [provider]  omeprazole (PRILOSEC) 20 MG capsule TAKE 1 CAPSULE BY MOUTH EVERY DAY 06/11/20   Tower, Wynelle Fanny, MD    Family History Family History  Problem Relation Age of Onset  . Dementia Mother   . Cancer Father        esophageal  . Diabetes Brother   . Cancer Paternal Aunt  ovarian    Social History Social History   Tobacco Use  . Smoking status: Never Smoker  . Smokeless tobacco: Never Used  Vaping Use  . Vaping Use: Never used  Substance Use Topics  . Alcohol use: Yes    Alcohol/week: 0.0 standard drinks    Comment: wine rarely  . Drug use: No     Allergies   Pravachol and Zetia [ezetimibe]   Review of Systems Review of Systems  Constitutional: Positive for fatigue. Negative for chills and fever.  HENT: Positive for postnasal drip and sinus pain. Negative for congestion, rhinorrhea and sore throat.   Eyes: Negative for discharge, redness and itching.  Respiratory: Positive for cough. Negative for shortness of breath.     Cardiovascular: Negative for chest pain.  Gastrointestinal: Positive for diarrhea. Negative for nausea and vomiting.  Genitourinary: Negative for dysuria.  Musculoskeletal: Negative for myalgias.       Daily arthritic pains   Skin: Negative for rash.  Neurological: Positive for headaches.  All other systems reviewed and are negative.    Physical Exam Triage Vital Signs ED Triage Vitals  Enc Vitals Group     BP      Pulse      Resp      Temp      Temp src      SpO2      Weight      Height      Head Circumference      Peak Flow      Pain Score      Pain Loc      Pain Edu?      Excl. in [place]?    No data found.  Updated Vital Signs There were no vitals taken for this visit.  Visual Acuity Right Eye Distance:   Left Eye Distance:   Bilateral Distance:    Right Eye Near:   Left Eye Near:    Bilateral Near:     Physical Exam Vitals and nursing note reviewed.  Constitutional:      General: She is not in acute distress.    Appearance: She is well-developed.  HENT:     Head: Normocephalic and atraumatic.     Mouth/Throat:     Mouth: Mucous membranes are moist.     Pharynx: Oropharynx is clear.  Eyes:     Extraocular Movements: Extraocular movements intact.     Conjunctiva/sclera: Conjunctivae normal.     Pupils: Pupils are equal, round, and reactive to light.  Cardiovascular:     Rate and Rhythm: Normal rate and regular rhythm.     Heart sounds: Normal heart sounds. No murmur heard.   Pulmonary:     Effort: Pulmonary effort is normal. No respiratory distress.     Breath sounds: Normal breath sounds.  Abdominal:     Palpations: Abdomen is soft.     Tenderness: There is no abdominal tenderness.  Musculoskeletal:     Cervical back: Normal range of motion.  Lymphadenopathy:     Cervical: No cervical adenopathy.  Skin:    General: Skin is warm and dry.     Capillary Refill: Capillary refill takes less than 2 seconds.  Neurological:     Mental Status: She  is alert and oriented to person, place, and time.      UC Treatments / Results  Labs (all labs ordered are listed, but only abnormal results are displayed) Labs Reviewed - No data to display  EKG   Radiology  No results found.  Procedures Procedures (including critical care time)  Medications Ordered in UC Medications - No data to display  Initial Impression / Assessment and Plan / UC Course  I have reviewed the triage vital signs and the nursing notes.  Pertinent labs & imaging results that were available during my care of the patient were reviewed by me and considered in my medical decision making (see chart for details).     History consistent with food related enteritis with chronic post nasal drip and intermittent headache. Overall pt is well appearing, well hydrated, without respiratory distress. Discussed symptomatic treatment. Can not rule out COVID though it is less likely as patient is vaccinated.  COVID test pending.  - Stressed hydration - Discussed ED precautions, understanding voiced  Final Clinical Impressions(s) / UC Diagnoses   Final diagnoses:  None   Discharge Instructions   None    ED Prescriptions    None     PDMP not reviewed this encounter.   Lyndee Hensen, DO 07/25/20 1245

## 2020-07-25 NOTE — Discharge Instructions (Addendum)
You were seen at the Urgent Care for diarrhea. Please pick up your prescriptions at your pharmacy. Be sure to stay hydrated. Follow up with your PCP as needed.   If you haven't already, sign up for My Chart to have easy access to your labs results, and communication with your primary care physician.  Dr. Susa Simmonds

## 2020-07-25 NOTE — ED Triage Notes (Signed)
Pt reports having diarrhea, nasal congestion and  headache since 4 am today. Acetaminophen relieve the headache.

## 2020-07-26 LAB — SARS CORONAVIRUS 2 (TAT 6-24 HRS): SARS Coronavirus 2: NEGATIVE

## 2020-07-30 ENCOUNTER — Encounter (INDEPENDENT_AMBULATORY_CARE_PROVIDER_SITE_OTHER): Payer: Medicare Other | Admitting: Ophthalmology

## 2020-07-30 ENCOUNTER — Other Ambulatory Visit: Payer: Self-pay

## 2020-07-30 DIAGNOSIS — H43813 Vitreous degeneration, bilateral: Secondary | ICD-10-CM

## 2020-07-30 DIAGNOSIS — H353131 Nonexudative age-related macular degeneration, bilateral, early dry stage: Secondary | ICD-10-CM | POA: Diagnosis not present

## 2020-08-29 ENCOUNTER — Encounter: Payer: Self-pay | Admitting: Emergency Medicine

## 2020-08-29 ENCOUNTER — Ambulatory Visit
Admission: EM | Admit: 2020-08-29 | Discharge: 2020-08-29 | Disposition: A | Discharge status: Home / Self Care | Payer: Medicare Other | Attending: Emergency Medicine | Admitting: Emergency Medicine

## 2020-08-29 DIAGNOSIS — J069 Acute upper respiratory infection, unspecified: Secondary | ICD-10-CM | POA: Diagnosis not present

## 2020-08-29 NOTE — Discharge Instructions (Addendum)
Your COVID test is pending.  You should self quarantine until the test result is back.    Take Tylenol as needed for fever or discomfort.  Rest and keep yourself hydrated.    Go to the emergency department if you develop acute worsening symptoms.     

## 2020-08-29 NOTE — ED Triage Notes (Signed)
Pyt c/o runny nose, sneezing, productive cough and chest tighness after coughing onset Tuesday. She states her daughter has similar symptoms. Pt states she has a hx of bronchitis. She recently traveled to [place].

## 2020-08-29 NOTE — ED Provider Notes (Signed)
[name]    CSN: [id number] Arrival date & time: 08/29/20  1733      History   Chief Complaint Chief Complaint  Patient presents with  . URI    HPI Brianna [name] is a 74 y.o. female.   Presents with 3 to 4-day history of cough, sneezing, runny nose, and chest congestion.  Her daughter and granddaughter have similar symptoms.  She denies fever, chills, ear pain, shortness of breath, vomiting, diarrhea, or other symptoms.  No treatments attempted at home.  Her medical history includes hypertension.    The history is provided by the patient.    Past Medical History:  Diagnosis Date  . Allergic rhinitis   . Breast carcinoma ([place]) 10/11   right- eventual double mastectomy in 2012  . GERD (gastroesophageal reflux disease)   . Hyperlipidemia   . Osteoporosis    fosamax started 9/08  . Shingles   . Vitamin D deficiency     Patient Active Problem List   Diagnosis Date Noted  . Hypertension, essential 06/05/2020  . Thumb pain, left 01/30/2019  . Plantar fasciitis 06/19/2018  . Low back pain 06/19/2018  . Abnormal urinalysis 06/19/2018  . Routine general medical examination at a health care facility 05/10/2018  . Chronic venous insufficiency 09/10/2016  . Pedal edema 08/13/2016  . Dyspepsia 04/12/2016  . Prediabetes 03/21/2015  . Nocturnal leg cramps 03/21/2015  . Estrogen deficiency 03/21/2015  . History of breast cancer 12/26/2014  . Encounter for Medicare annual wellness exam 02/28/2014  . Pre-employment examination 08/29/2013  . Vitamin D deficiency 10/11/2008  . Hyperlipidemia 07/07/2007  . GERD 07/07/2007  . Osteoporosis 07/07/2007  . Fatty liver 07/07/2007    Past Surgical History:  Procedure Laterality Date  . BREAST BIOPSY  10/11   invasive and in situ mam carcinoma  . BREAST LUMPECTOMY  11/11  . CATARACT EXTRACTION     does not remember when  . MASTECTOMY  1/12   double- Dr. Donne Hazel  . MASTECTOMY, RADICAL Bilateral 11/2010  .  PARTIAL HYSTERECTOMY  1980's   non cancer, ovaries intact  . WRIST FRACTURE SURGERY Left    in the 1990's    OB History   No obstetric history on file.      Home Medications    Prior to Admission medications   Medication Sig Start Date End Date Taking? Authorizing Provider  Calcium Carbonate-Vitamin D (CALCIUM-VITAMIN D) 600-200 MG-UNIT CAPS Take 2 by mouth daily    Yes [provider]  Cholecalciferol (VITAMIN D3 PO) Take 250 mcg by mouth daily.   Yes [provider]  Cyanocobalamin (B-12) 2000 MCG TABS Take 1 tablet by mouth daily.   Yes [provider]  fexofenadine (ALLEGRA) 180 MG tablet Take otc as directed   Yes [provider]  hydrochlorothiazide (HYDRODIURIL) 12.5 MG tablet Take 12.5 mg by mouth as needed (takes PRN for swelling).   Yes [provider]  omeprazole (PRILOSEC) 20 MG capsule TAKE 1 CAPSULE BY MOUTH EVERY DAY 06/11/20  Yes Tower, Wynelle Fanny, MD  Multiple Vitamin (MULTIVITAMIN) tablet Take 1 tablet by mouth daily.      [provider]    Family History Family History  Problem Relation Age of Onset  . Dementia Mother   . Cancer Father        esophageal  . Diabetes Brother   . Cancer Paternal Aunt        ovarian    Social History Social History  Tobacco Use  . Smoking status: Never Smoker  . Smokeless tobacco: Never Used  Vaping Use  . Vaping Use: Never used  Substance Use Topics  . Alcohol use: Yes    Alcohol/week: 0.0 standard drinks    Comment: wine rarely  . Drug use: No     Allergies   Pravachol and Zetia [ezetimibe]   Review of Systems Review of Systems  Constitutional: Negative for chills and fever.  HENT: Positive for congestion, rhinorrhea and sneezing. Negative for ear pain and sore throat.   Eyes: Negative for pain and visual disturbance.  Respiratory: Positive for cough. Negative for shortness of breath.   Cardiovascular: Negative for chest pain and palpitations.    Gastrointestinal: Negative for abdominal pain and vomiting.  Genitourinary: Negative for dysuria and hematuria.  Musculoskeletal: Negative for arthralgias and back pain.  Skin: Negative for color change and rash.  Neurological: Negative for seizures and syncope.  All other systems reviewed and are negative.    Physical Exam Triage Vital Signs ED Triage Vitals  Enc Vitals Group     BP      Pulse      Resp      Temp      Temp src      SpO2      Weight      Height      Head Circumference      Peak Flow      Pain Score      Pain Loc      Pain Edu?      Excl. in [place]?    No data found.  Updated Vital Signs BP 137/68 (BP Location: Left Arm)   Pulse 91   Temp 99.8 F (37.7 C) (Oral)   Resp 20   SpO2 98%   Visual Acuity Right Eye Distance:   Left Eye Distance:   Bilateral Distance:    Right Eye Near:   Left Eye Near:    Bilateral Near:     Physical Exam Vitals and nursing note reviewed.  Constitutional:      General: She is not in acute distress.    Appearance: She is well-developed. She is not ill-appearing.  HENT:     Head: Normocephalic and atraumatic.     Right Ear: Tympanic membrane normal.     Left Ear: Tympanic membrane normal.     Nose: Nose normal.     Mouth/Throat:     Mouth: Mucous membranes are moist.     Pharynx: Oropharynx is clear.  Eyes:     Conjunctiva/sclera: Conjunctivae normal.  Cardiovascular:     Rate and Rhythm: Normal rate and regular rhythm.     Heart sounds: Normal heart sounds.  Pulmonary:     Effort: Pulmonary effort is normal. No respiratory distress.     Breath sounds: Normal breath sounds. No wheezing or rhonchi.  Abdominal:     Palpations: Abdomen is soft.     Tenderness: There is no abdominal tenderness. There is no guarding or rebound.  Musculoskeletal:     Cervical back: Neck supple.  Skin:    General: Skin is warm and dry.     Findings: No rash.  Neurological:     General: No focal deficit present.     Mental  Status: She is alert and oriented to person, place, and time.     Gait: Gait normal.  Psychiatric:        Mood and Affect: Mood normal.  Behavior: Behavior normal.      UC Treatments / Results  Labs (all labs ordered are listed, but only abnormal results are displayed) Labs Reviewed  NOVEL CORONAVIRUS, NAA    EKG   Radiology No results found.  Procedures Procedures (including critical care time)  Medications Ordered in UC Medications - No data to display  Initial Impression / Assessment and Plan / UC Course  I have reviewed the triage vital signs and the nursing notes.  Pertinent labs & imaging results that were available during my care of the patient were reviewed by me and considered in my medical decision making (see chart for details).   Viral URI.  PCR COVID pending.  Instructed patient to self quarantine until the test result is back.  Discussed symptomatic treatment including Tylenol, rest, hydration.  Instructed patient to go to the ED if she has acute worsening symptoms.  Patient agrees to plan of care.    Final Clinical Impressions(s) / UC Diagnoses   Final diagnoses:  Viral URI     Discharge Instructions     Your COVID test is pending.  You should self quarantine until the test result is back.    Take Tylenol as needed for fever or discomfort.  Rest and keep yourself hydrated.    Go to the emergency department if you develop acute worsening symptoms.        ED Prescriptions    None     PDMP not reviewed this encounter.   Sharion Balloon, NP 08/29/20 1821

## 2020-08-31 LAB — SARS-COV-2, NAA 2 DAY TAT

## 2020-08-31 LAB — NOVEL CORONAVIRUS, NAA: SARS-CoV-2, NAA: NOT DETECTED

## 2020-09-13 DIAGNOSIS — Z23 Encounter for immunization: Secondary | ICD-10-CM | POA: Diagnosis not present

## 2020-09-21 DIAGNOSIS — Z23 Encounter for immunization: Secondary | ICD-10-CM | POA: Diagnosis not present

## 2020-10-22 ENCOUNTER — Telehealth: Payer: Self-pay

## 2020-10-22 DIAGNOSIS — M810 Age-related osteoporosis without current pathological fracture: Secondary | ICD-10-CM

## 2020-10-22 NOTE — Telephone Encounter (Signed)
Last Prolia inj 04/24/20 Next prolia due after 10/25/20 Need to run benefits. No PA was needed the last time benefits were run.  Sending msg to [place] to run.

## 2020-10-24 NOTE — Telephone Encounter (Signed)
Not sure if has form in book or in new one has been started. Printed face sheet so that I can check with rep when I talk to her later today

## 2020-11-07 NOTE — Telephone Encounter (Signed)
Pt cost for prolia will be $0 and no PA is required.  Pt will need scheduled for lab and NV.  LVM for pt to return call.  Call must be sent to [facility], RN in the clinic for scheduling

## 2020-11-10 NOTE — Telephone Encounter (Signed)
Advised pt of cost. Pt ok with proceeding with inj. Scheduled pt for lab for tomorrow and prolia inj on 11/18/20. Pt verbalized understanding.   Added BMP order.

## 2020-11-11 ENCOUNTER — Other Ambulatory Visit (INDEPENDENT_AMBULATORY_CARE_PROVIDER_SITE_OTHER): Payer: Medicare Other

## 2020-11-11 ENCOUNTER — Other Ambulatory Visit: Payer: Self-pay

## 2020-11-11 DIAGNOSIS — M810 Age-related osteoporosis without current pathological fracture: Secondary | ICD-10-CM

## 2020-11-11 LAB — BASIC METABOLIC PANEL
BUN: 13 mg/dL (ref 6–23)
CO2: 32 mEq/L (ref 19–32)
Calcium: 10.8 mg/dL — ABNORMAL HIGH (ref 8.4–10.5)
Chloride: 105 mEq/L (ref 96–112)
Creatinine, Ser: 0.93 mg/dL (ref 0.40–1.20)
GFR: 59.71 mL/min — ABNORMAL LOW (ref >60.00)
Glucose, Bld: 104 mg/dL — ABNORMAL HIGH (ref 70–99)
Potassium: 4.6 mEq/L (ref 3.5–5.1)
Sodium: 140 mEq/L (ref 135–145)

## 2020-11-11 NOTE — Telephone Encounter (Signed)
Please hold off on prolia for now Re check ca with PTH , vit D level and albumin (can re calculate adjusted calcium and go from there) Thanks

## 2020-11-11 NOTE — Telephone Encounter (Signed)
Ca is 10.8, hypercalcemia, and CrCl is 51.59. Sending note to Dr. [name] to ok prolia due to hypercalcemia. Pt is scheduled for prolia on 12/28

## 2020-11-12 ENCOUNTER — Other Ambulatory Visit: Payer: Self-pay | Admitting: Family Medicine

## 2020-11-12 NOTE — Telephone Encounter (Signed)
Returning call.

## 2020-11-12 NOTE — Telephone Encounter (Signed)
LVM. Please transfer call to this nurse in the clinic or if this nurse is not available let the pt know this nurse will call the pt back tomorrow.

## 2020-11-13 ENCOUNTER — Other Ambulatory Visit (INDEPENDENT_AMBULATORY_CARE_PROVIDER_SITE_OTHER): Payer: Medicare Other

## 2020-11-13 ENCOUNTER — Other Ambulatory Visit: Payer: Self-pay

## 2020-11-13 LAB — VITAMIN D 25 HYDROXY (VIT D DEFICIENCY, FRACTURES): VITD: 42.71 ng/mL (ref 30.00–100.00)

## 2020-11-13 LAB — ALBUMIN: Albumin: 4 g/dL (ref 3.5–5.2)

## 2020-11-13 NOTE — Telephone Encounter (Signed)
Contacted pt and advised of PCP msg. Scheduled pt for lab apt for today. Advised this office would f/u next week with results. Pt verbalized understanding.

## 2020-11-17 LAB — PTH, INTACT AND CALCIUM
Calcium: 9.7 mg/dL (ref 8.6–10.4)
PTH: 63 pg/mL (ref 14–64)

## 2020-11-18 ENCOUNTER — Telehealth: Payer: Self-pay | Admitting: Family Medicine

## 2020-11-18 ENCOUNTER — Ambulatory Visit: Payer: Medicare Other

## 2020-11-18 NOTE — Telephone Encounter (Signed)
Error

## 2020-11-19 ENCOUNTER — Ambulatory Visit (INDEPENDENT_AMBULATORY_CARE_PROVIDER_SITE_OTHER): Payer: Medicare Other

## 2020-11-19 ENCOUNTER — Other Ambulatory Visit: Payer: Self-pay

## 2020-11-19 DIAGNOSIS — M810 Age-related osteoporosis without current pathological fracture: Secondary | ICD-10-CM | POA: Diagnosis not present

## 2020-11-19 MED ORDER — DENOSUMAB 60 MG/ML ~~LOC~~ SOSY
60.0000 mg | PREFILLED_SYRINGE | Freq: Once | SUBCUTANEOUS | Status: AC
  Administered 2020-11-19: 60 mg via SUBCUTANEOUS

## 2020-11-19 NOTE — Progress Notes (Signed)
Per orders of Dr. Roxy Manns, injection of Prolia  given by Donnamarie Poag in right arm.  Patient tolerated injection well. We will call patient in 6 months to set up next injections.

## 2020-11-20 NOTE — Telephone Encounter (Signed)
Pt came in yesterday, 12/29, for prolia inj.

## 2021-04-07 ENCOUNTER — Other Ambulatory Visit: Payer: Self-pay

## 2021-04-07 ENCOUNTER — Ambulatory Visit
Admission: EM | Admit: 2021-04-07 | Discharge: 2021-04-07 | Disposition: A | Discharge status: Home / Self Care | Payer: Medicare Other | Attending: Emergency Medicine | Admitting: Emergency Medicine

## 2021-04-07 ENCOUNTER — Telehealth: Payer: Self-pay

## 2021-04-07 DIAGNOSIS — U071 COVID-19: Secondary | ICD-10-CM | POA: Diagnosis not present

## 2021-04-07 MED ORDER — MOLNUPIRAVIR EUA 200MG CAPSULE: 4.0000 | ORAL_CAPSULE | Freq: Two times a day (BID) | ORAL | 0 refills | Status: AC

## 2021-04-07 NOTE — Telephone Encounter (Signed)
[facility] - Client TELEPHONE ADVICE RECORD [facility] Patient Name: Brianna [name] Gender: Female DOB: 06/21/1946 Age: 75 Y 10 M 22 D Return Phone Number: [phone] (Primary), [phone] (Secondary) Address: [name] [address] Client  [facility] - Client Client Site [facility] Brianna [name], Brianna [name] - MD Contact Type Call Who Is Calling Patient / Member / Family / Caregiver Call Type Triage / Clinical Relationship To Patient Self Return Phone Number [phone] (Primary) Chief Complaint Cold Symptom Reason for Call Symptomatic / Request for [place] states she has a Pt who tested + for covid on 2 separate tests, wants some advice. Patient states she has a head cold, cough, but no fever. Just does not feel [name]. Nose hurts and is congested but has clear mucus. [place] Translation No Nurse Assessment Nurse: Rolin Barry, RN, Levada Dy Date/Time (Eastern Time): 04/07/2021 11:07:02 AM Confirm and document reason for call. If symptomatic, describe symptoms. ---Caller tested + for covid on 2 separate tests, wants some advice. Patient states she has a head cold, cough, but no fever. Just does not feel [name]. Nose hurts and is congested but has clear mucus. No current temp. Caller has had 2 vaccines + a booster. Does the patient have any new or worsening symptoms? ---Yes Will a triage be completed? ---Yes Related visit to physician within the last 2 weeks? ---No Does the PT have any chronic conditions? (i.e. diabetes, asthma, this includes High risk factors for pregnancy, etc.) ---Yes List chronic conditions. ---acid reflux Is this a behavioral health or substance abuse call? ---No Guidelines Guideline Title Affirmed Question Affirmed Notes Nurse Date/Time ([place] Time) COVID-19 - Diagnosed  or Suspected Chest pain or pressure Deaton, RN, Levada Dy 04/07/2021 11:08:31 AM PLEASE NOTE: All timestamps contained within this report are represented as [place] Standard Time. CONFIDENTIALTY NOTICE: This fax transmission is intended only for the addressee. It contains information that is legally privileged, confidential or otherwise protected from use or disclosure. If you are not the intended recipient, you are strictly prohibited from reviewing, disclosing, copying using or disseminating any of this information or taking any action in reliance on or regarding this information. If you have received this fax in error, please notify us immediately by telephone so that we can arrange for its return to [place]. Phone: [phone], Toll-Free: [phone], Fax: [phone] Page: 2 of 2 Call Id: [id number] [place]. Time Eilene Ghazi Time) Disposition Final User 04/07/2021 11:14:11 AM Go to ED Now (or PCP triage) Yes Deaton, RN, Cindee Lame Disagree/Comply Comply Caller Understands Yes PreDisposition Did not know what to do Care Advice Given Per Guideline GO TO ED NOW (OR PCP TRIAGE): GENERAL CARE ADVICE FOR COVID-19 SYMPTOMS: * The symptoms are generally treated the same whether you have COVID-19, influenza or some other respiratory virus. * Cough: Use cough drops. * Feeling dehydrated: Drink extra liquids. If the air in your home is dry, use a humidifier. CARE ADVICE given per COVID-19 - DIAGNOSED OR SUSPECTED (Adult) guideline. NOTE TO TRIAGER - IF NO PCP, IF AVAILABLE HAVE OTHER DOCTOR (OR NP/PA) RE-TRIAGE THE PATIENT: * During this COVID-19 pandemic, the medical community is trying to prevent unnecessary referrals to the emergency department (ED). Some patients are fearful of being exposed to COVID-19 in a medical setting. Second-level triage (re-triage) by a doctor (or NP/PA) has been shown to reduce ED referrals. Here are resources that may be  available in your community. Comments User: Saverio Danker, RN Date/Time Eilene Ghazi Time): 04/07/2021 11:15:15 AM O2 sat 94% on RA. Caller advised that she is feeling some discomfort in her chest. Referrals GO TO FACILITY OTHER - SPECIFY

## 2021-04-07 NOTE — Discharge Instructions (Addendum)
Take the molnupiravir as directed for treatment of COVID.    Go to the emergency department if you have acute shortness of breath or other concerning symptoms.    Follow up with your primary care provider if your symptoms are not improving.

## 2021-04-07 NOTE — Telephone Encounter (Signed)
Per chart review tab pt was seen at [facility] today.

## 2021-04-07 NOTE — Telephone Encounter (Signed)
She is being treated with antiviral  Please check in with her tomorrow, thanks

## 2021-04-07 NOTE — ED Provider Notes (Signed)
[name]    CSN: [id number] Arrival date & time: 04/07/21  1146      History   Chief Complaint Chief Complaint  Patient presents with  . Cough    HPI Brianna [name] is a 75 y.o. female.   Patient presents with nonproductive cough and nasal congestion x3 days.  She had 2 positive COVID tests at home this morning.  She denies fever, rash, sore throat, shortness of breath, vomiting, diarrhea, or other symptoms.  No treatment attempted at home.  Her medical history includes hypertension, breast cancer, allergic rhinitis, prediabetes, chronic venous insufficiency.  The history is provided by the patient and medical records.  Cough Associated symptoms: no chest pain, no chills, no ear pain, no fever, no rash, no shortness of breath and no sore throat     Past Medical History:  Diagnosis Date  . Allergic rhinitis   . Breast carcinoma ([place]) 10/11   right- eventual double mastectomy in 2012  . GERD (gastroesophageal reflux disease)   . Hyperlipidemia   . Osteoporosis    fosamax started 9/08  . Shingles   . Vitamin D deficiency     Patient Active Problem List   Diagnosis Date Noted  . Hypertension, essential 06/05/2020  . Thumb pain, left 01/30/2019  . Plantar fasciitis 06/19/2018  . Low back pain 06/19/2018  . Abnormal urinalysis 06/19/2018  . Routine general medical examination at a health care facility 05/10/2018  . Chronic venous insufficiency 09/10/2016  . Pedal edema 08/13/2016  . Dyspepsia 04/12/2016  . Prediabetes 03/21/2015  . Nocturnal leg cramps 03/21/2015  . Estrogen deficiency 03/21/2015  . History of breast cancer 12/26/2014  . Encounter for Medicare annual wellness exam 02/28/2014  . Pre-employment examination 08/29/2013  . Vitamin D deficiency 10/11/2008  . Hyperlipidemia 07/07/2007  . GERD 07/07/2007  . Osteoporosis 07/07/2007  . Fatty liver 07/07/2007    Past Surgical History:  Procedure Laterality Date  . BREAST BIOPSY  10/11    invasive and in situ mam carcinoma  . BREAST LUMPECTOMY  11/11  . CATARACT EXTRACTION     does not remember when  . MASTECTOMY  1/12   double- Dr. Donne Hazel  . MASTECTOMY, RADICAL Bilateral 11/2010  . PARTIAL HYSTERECTOMY  1980's   non cancer, ovaries intact  . WRIST FRACTURE SURGERY Left    in the 1990's    OB History   No obstetric history on file.      Home Medications    Prior to Admission medications   Medication Sig Start Date End Date Taking? Authorizing Provider  molnupiravir EUA 200 mg CAPS Take 4 capsules (800 mg total) by mouth 2 (two) times daily for 5 days. 04/07/21 04/12/21 Yes Sharion Balloon, NP  Calcium Carbonate-Vitamin D (CALCIUM-VITAMIN D) 600-200 MG-UNIT CAPS Take 2 by mouth daily     [provider]  Cholecalciferol (VITAMIN D3 PO) Take 250 mcg by mouth daily.    [provider]  Cyanocobalamin (B-12) 2000 MCG TABS Take 1 tablet by mouth daily.    [provider]  fexofenadine (ALLEGRA) 180 MG tablet Take otc as directed    [provider]  hydrochlorothiazide (HYDRODIURIL) 12.5 MG tablet Take 12.5 mg by mouth as needed (takes PRN for swelling).    [provider]  Multiple Vitamin (MULTIVITAMIN) tablet Take 1 tablet by mouth daily.      [provider]  omeprazole (PRILOSEC) 20 MG capsule TAKE 1 CAPSULE BY MOUTH EVERY DAY 06/11/20  Tower, Wynelle Fanny, MD    Family History Family History  Problem Relation Age of Onset  . Dementia Mother   . Cancer Father        esophageal  . Diabetes Brother   . Cancer Paternal Aunt        ovarian    Social History Social History   Tobacco Use  . Smoking status: Never Smoker  . Smokeless tobacco: Never Used  Vaping Use  . Vaping Use: Never used  Substance Use Topics  . Alcohol use: Yes    Alcohol/week: 0.0 standard drinks    Comment: wine rarely  . Drug use: No     Allergies   Pravachol and Zetia [ezetimibe]   Review of Systems Review of Systems   Constitutional: Negative for chills and fever.  HENT: Positive for congestion. Negative for ear pain and sore throat.   Eyes: Negative for visual disturbance.  Respiratory: Positive for cough. Negative for shortness of breath.   Cardiovascular: Negative for chest pain and palpitations.  Gastrointestinal: Negative for abdominal pain, diarrhea and vomiting.  Skin: Negative for color change and rash.  All other systems reviewed and are negative.    Physical Exam Triage Vital Signs ED Triage Vitals  Enc Vitals Group     BP      Pulse      Resp      Temp      Temp src      SpO2      Weight      Height      Head Circumference      Peak Flow      Pain Score      Pain Loc      Pain Edu?      Excl. in [place]?    No data found.  Updated Vital Signs BP 138/76   Pulse 76   Temp 98 F (36.7 C)   Resp 20   SpO2 95%   Visual Acuity Right Eye Distance:   Left Eye Distance:   Bilateral Distance:    Right Eye Near:   Left Eye Near:    Bilateral Near:     Physical Exam Vitals and nursing note reviewed.  Constitutional:      General: She is not in acute distress.    Appearance: She is well-developed. She is not ill-appearing.  HENT:     Head: Normocephalic and atraumatic.     Right Ear: Tympanic membrane normal.     Left Ear: Tympanic membrane normal.     Nose: Rhinorrhea present.     Mouth/Throat:     Mouth: Mucous membranes are moist.     Pharynx: Oropharynx is clear.  Eyes:     Conjunctiva/sclera: Conjunctivae normal.  Cardiovascular:     Rate and Rhythm: Normal rate and regular rhythm.     Heart sounds: Normal heart sounds.  Pulmonary:     Effort: Pulmonary effort is normal. No respiratory distress.     Breath sounds: Normal breath sounds.  Abdominal:     Palpations: Abdomen is soft.     Tenderness: There is no abdominal tenderness.  Musculoskeletal:     Cervical back: Neck supple.  Skin:    General: Skin is warm and dry.  Neurological:     General: No  focal deficit present.     Mental Status: She is alert and oriented to person, place, and time.     Gait: Gait normal.  Psychiatric:  Mood and Affect: Mood normal.        Behavior: Behavior normal.      UC Treatments / Results  Labs (all labs ordered are listed, but only abnormal results are displayed) Labs Reviewed - No data to display  EKG   Radiology No results found.  Procedures Procedures (including critical care time)  Medications Ordered in UC Medications - No data to display  Initial Impression / Assessment and Plan / UC Course  I have reviewed the triage vital signs and the nursing notes.  Pertinent labs & imaging results that were available during my care of the patient were reviewed by me and considered in my medical decision making (see chart for details).   COVID-19.  Patient had 2 positive COVID tests at home this morning.  Based on her age and comorbidities, treating with molnupiravir.  No GFR in her chart since December 2021 and was <60 at that time.  Discussed other symptomatic treatment.  ED precautions discussed.  Education provided on symptomatic management of COVID.  Instructed patient to follow-up with her PCP if her symptoms are not improving.  She agrees to plan of care.   Final Clinical Impressions(s) / UC Diagnoses   Final diagnoses:  [id number]     Discharge Instructions     Take the molnupiravir as directed for treatment of COVID.    Go to the emergency department if you have acute shortness of breath or other concerning symptoms.    Follow up with your primary care provider if your symptoms are not improving.        ED Prescriptions    Medication Sig Dispense Auth. Provider   molnupiravir EUA 200 mg CAPS Take 4 capsules (800 mg total) by mouth 2 (two) times daily for 5 days. 40 capsule Sharion Balloon, NP     PDMP not reviewed this encounter.   Sharion Balloon, NP 04/07/21 1233

## 2021-04-07 NOTE — ED Triage Notes (Signed)
Pt presents with complaints of cough and congestion x 3 days. Reports cough is off and on productive. Denies trying any otc cough medication at home. Pt did have a home covid test that was positive.

## 2021-04-08 ENCOUNTER — Telehealth: Payer: Self-pay

## 2021-04-08 DIAGNOSIS — M810 Age-related osteoporosis without current pathological fracture: Secondary | ICD-10-CM

## 2021-04-08 NOTE — Telephone Encounter (Signed)
Benefit verification submitted-pending Next injection due after 05/21/21

## 2021-04-08 NOTE — Telephone Encounter (Signed)
sxs are bout the same as yesterday but her cough is improving. No SOB or trouble breathing, no fevers but woke up sweating today but that was one time. Pt said she just feels like she just has a bad could but nothing worrisome. Pt advise if sxs worsen or she has any questions at all to let us know. Pt verbalized understanding and thanked me for checking on her

## 2021-04-18 ENCOUNTER — Ambulatory Visit (HOSPITAL_COMMUNITY)
Admission: EM | Admit: 2021-04-18 | Discharge: 2021-04-18 | Disposition: A | Discharge status: Home / Self Care | Payer: Medicare Other | Attending: Medical Oncology | Admitting: Medical Oncology

## 2021-04-18 ENCOUNTER — Encounter (HOSPITAL_COMMUNITY): Payer: Self-pay

## 2021-04-18 ENCOUNTER — Other Ambulatory Visit: Payer: Self-pay

## 2021-04-18 DIAGNOSIS — U071 COVID-19: Secondary | ICD-10-CM | POA: Insufficient documentation

## 2021-04-18 DIAGNOSIS — J069 Acute upper respiratory infection, unspecified: Secondary | ICD-10-CM | POA: Insufficient documentation

## 2021-04-18 LAB — SARS CORONAVIRUS 2 (TAT 6-24 HRS): SARS Coronavirus 2: POSITIVE — AB

## 2021-04-18 MED ORDER — FLUTICASONE PROPIONATE 50 MCG/ACT NA SUSP: 2.0000 | Freq: Every day | NASAL | 0 refills | Status: DC

## 2021-04-18 NOTE — ED Triage Notes (Signed)
Pt present runny nose and cough, symptom started a few days ago.   Pt two weeks ago patient took an at home covid test and it was positive. Pt is returning to get an accurate test due to her symptoms returned after complete the medication she was prescribed.

## 2021-04-18 NOTE — ED Provider Notes (Signed)
[place]    CSN: [id number] Arrival date & time: 04/18/21  1016      History   Chief Complaint Chief Complaint  Patient presents with  . Headache  . Nasal Congestion    HPI Brianna [name] is a 75 y.o. female.   HPI   Runny Nose: Patient reports that a few days ago she started having a runny nose and cough.  She states that about 2 weeks ago she had similar symptoms and took it at home COVID test which was positive.  She is concerned that her symptoms have returned and would like to get an updated COVID test as she is not convinced that her home test was accurate. She denies SOB, chest pain, fevers.   Past Medical History:  Diagnosis Date  . Allergic rhinitis   . Breast carcinoma ([place]) 10/11   right- eventual double mastectomy in 2012  . GERD (gastroesophageal reflux disease)   . Hyperlipidemia   . Osteoporosis    fosamax started 9/08  . Shingles   . Vitamin D deficiency     Patient Active Problem List   Diagnosis Date Noted  . Hypertension, essential 06/05/2020  . Thumb pain, left 01/30/2019  . Plantar fasciitis 06/19/2018  . Low back pain 06/19/2018  . Abnormal urinalysis 06/19/2018  . Routine general medical examination at a health care facility 05/10/2018  . Chronic venous insufficiency 09/10/2016  . Pedal edema 08/13/2016  . Dyspepsia 04/12/2016  . Prediabetes 03/21/2015  . Nocturnal leg cramps 03/21/2015  . Estrogen deficiency 03/21/2015  . History of breast cancer 12/26/2014  . Encounter for Medicare annual wellness exam 02/28/2014  . Pre-employment examination 08/29/2013  . Vitamin D deficiency 10/11/2008  . Hyperlipidemia 07/07/2007  . GERD 07/07/2007  . Osteoporosis 07/07/2007  . Fatty liver 07/07/2007    Past Surgical History:  Procedure Laterality Date  . BREAST BIOPSY  10/11   invasive and in situ mam carcinoma  . BREAST LUMPECTOMY  11/11  . CATARACT EXTRACTION     does not remember when  . MASTECTOMY  1/12   double- Dr.  Donne Hazel  . MASTECTOMY, RADICAL Bilateral 11/2010  . PARTIAL HYSTERECTOMY  1980's   non cancer, ovaries intact  . WRIST FRACTURE SURGERY Left    in the 1990's    OB History   No obstetric history on file.      Home Medications    Prior to Admission medications   Medication Sig Start Date End Date Taking? Authorizing Provider  Calcium Carbonate-Vitamin D (CALCIUM-VITAMIN D) 600-200 MG-UNIT CAPS Take 2 by mouth daily     [provider]  Cholecalciferol (VITAMIN D3 PO) Take 250 mcg by mouth daily.    [provider]  Cyanocobalamin (B-12) 2000 MCG TABS Take 1 tablet by mouth daily.    [provider]  fexofenadine (ALLEGRA) 180 MG tablet Take otc as directed    [provider]  hydrochlorothiazide (HYDRODIURIL) 12.5 MG tablet Take 12.5 mg by mouth as needed (takes PRN for swelling).    [provider]  Multiple Vitamin (MULTIVITAMIN) tablet Take 1 tablet by mouth daily.      [provider]  omeprazole (PRILOSEC) 20 MG capsule TAKE 1 CAPSULE BY MOUTH EVERY DAY 06/11/20   Tower, Wynelle Fanny, MD    Family History Family History  Problem Relation Age of Onset  . Dementia Mother   . Cancer Father        esophageal  . Diabetes Brother   .  Cancer Paternal Aunt        ovarian    Social History Social History   Tobacco Use  . Smoking status: Never Smoker  . Smokeless tobacco: Never Used  Vaping Use  . Vaping Use: Never used  Substance Use Topics  . Alcohol use: Yes    Alcohol/week: 0.0 standard drinks    Comment: wine rarely  . Drug use: No     Allergies   Pravachol and Zetia [ezetimibe]   Review of Systems Review of Systems  As stated above in HPI Physical Exam Triage Vital Signs ED Triage Vitals  Enc Vitals Group     BP 04/18/21 1124 (!) 142/64     Pulse Rate 04/18/21 1124 80     Resp 04/18/21 1124 16     Temp 04/18/21 1124 98 F (36.7 C)     Temp Source 04/18/21 1124 Oral     SpO2 04/18/21 1124 99 %      Weight --      Height --      Head Circumference --      Peak Flow --      Pain Score 04/18/21 1128 0     Pain Loc --      Pain Edu? --      Excl. in [place]? --    No data found.  Updated Vital Signs BP (!) 142/64 (BP Location: Left Arm)   Pulse 80   Temp 98 F (36.7 C) (Oral)   Resp 16   SpO2 99%   Physical Exam Vitals and nursing note reviewed.  Constitutional:      General: She is not in acute distress.    Appearance: She is well-developed. She is not ill-appearing, toxic-appearing or diaphoretic.  HENT:     Head: Normocephalic and atraumatic.     Mouth/Throat:     Mouth: Mucous membranes are moist.     Pharynx: Oropharynx is clear.  Eyes:     Extraocular Movements: Extraocular movements intact.     Pupils: Pupils are equal, round, and reactive to light.  Cardiovascular:     Rate and Rhythm: Normal rate and regular rhythm.     Heart sounds: Normal heart sounds.  Pulmonary:     Effort: Pulmonary effort is normal.     Breath sounds: Normal breath sounds.  Musculoskeletal:     Cervical back: Normal range of motion and neck supple.  Lymphadenopathy:     Cervical: No cervical adenopathy.  Skin:    General: Skin is warm.  Neurological:     Mental Status: She is alert and oriented to person, place, and time.     Cranial Nerves: No cranial nerve deficit or facial asymmetry.     Sensory: No sensory deficit.     Motor: No weakness.     Coordination: Coordination normal.  Psychiatric:        Mood and Affect: Mood normal.        Speech: Speech normal.        Behavior: Behavior normal.      UC Treatments / Results  Labs (all labs ordered are listed, but only abnormal results are displayed) Labs Reviewed - No data to display  EKG   Radiology No results found.  Procedures Procedures (including critical care time)  Medications Ordered in UC Medications - No data to display  Initial Impression / Assessment and Plan / UC Course  I have reviewed the triage  vital signs and the nursing notes.  Pertinent  labs & imaging results that were available during my care of the patient were reviewed by me and considered in my medical decision making (see chart for details).     New. Likely continued symptoms or from allergies. Discussed Flonase which I will send in for her. Retesting to confirm although we discussed limitations. Reviewed red flag signs and symptoms.    Final Clinical Impressions(s) / UC Diagnoses   Final diagnoses:  None   Discharge Instructions   None    ED Prescriptions    None     PDMP not reviewed this encounter.   Hughie Closs, [place] 04/18/21 1209

## 2021-04-22 NOTE — Telephone Encounter (Signed)
Benefits received. OOP cost is $0. Lab appointment 05/15/21 and NV appointment on 05/26/21.

## 2021-04-23 NOTE — Addendum Note (Signed)
Addended by: Kris Mouton on: 04/23/2021 09:22 AM   Modules accepted: Orders

## 2021-05-15 ENCOUNTER — Other Ambulatory Visit (INDEPENDENT_AMBULATORY_CARE_PROVIDER_SITE_OTHER): Payer: Medicare Other

## 2021-05-15 ENCOUNTER — Other Ambulatory Visit: Payer: Self-pay

## 2021-05-15 DIAGNOSIS — M810 Age-related osteoporosis without current pathological fracture: Secondary | ICD-10-CM

## 2021-05-15 LAB — BASIC METABOLIC PANEL
BUN: 14 mg/dL (ref 6–23)
CO2: 28 mEq/L (ref 19–32)
Calcium: 9.8 mg/dL (ref 8.4–10.5)
Chloride: 105 mEq/L (ref 96–112)
Creatinine, Ser: 0.98 mg/dL (ref 0.40–1.20)
GFR: 55.87 mL/min — ABNORMAL LOW (ref >60.00)
Glucose, Bld: 121 mg/dL — ABNORMAL HIGH (ref 70–99)
Potassium: 4.8 mEq/L (ref 3.5–5.1)
Sodium: 141 mEq/L (ref 135–145)

## 2021-05-21 NOTE — Telephone Encounter (Signed)
CrCl is 48.19 mL/min. Ca normal at 9.8

## 2021-05-22 NOTE — Telephone Encounter (Signed)
Yes, please give prolia

## 2021-05-22 NOTE — Telephone Encounter (Signed)
Noted  

## 2021-05-26 ENCOUNTER — Ambulatory Visit (INDEPENDENT_AMBULATORY_CARE_PROVIDER_SITE_OTHER): Payer: Medicare Other

## 2021-05-26 ENCOUNTER — Other Ambulatory Visit: Payer: Self-pay

## 2021-05-26 DIAGNOSIS — M810 Age-related osteoporosis without current pathological fracture: Secondary | ICD-10-CM | POA: Diagnosis not present

## 2021-05-26 MED ORDER — DENOSUMAB 60 MG/ML ~~LOC~~ SOSY
60.0000 mg | PREFILLED_SYRINGE | Freq: Once | SUBCUTANEOUS | Status: AC
  Administered 2021-05-26: 60 mg via SUBCUTANEOUS

## 2021-05-26 NOTE — Progress Notes (Signed)
Per orders of Dr. [name], injection of Prolia given by Randall An. Patient tolerated injection well.

## 2021-06-12 ENCOUNTER — Ambulatory Visit (INDEPENDENT_AMBULATORY_CARE_PROVIDER_SITE_OTHER): Payer: Medicare Other

## 2021-06-12 DIAGNOSIS — Z0000 Encounter for general adult medical examination without abnormal findings: Secondary | ICD-10-CM

## 2021-06-12 NOTE — Patient Instructions (Signed)
[name] , Thank you for taking time to come for your Medicare Wellness Visit. I appreciate your ongoing commitment to your health goals. Please review the following plan we discussed and let me know if I can assist you in the future.   Screening recommendations/referrals: Colonoscopy: no longer required  Mammogram: no longer required  Bone Density: Up to date, completed 06/20/2020, due 05/2022 Recommended yearly ophthalmology/optometry visit for glaucoma screening and checkup Recommended yearly dental visit for hygiene and checkup  Vaccinations: Influenza vaccine: Up to date, completed 09/13/2020, due 06/2021 Pneumococcal vaccine: Completed series Tdap vaccine: decline-insurance Shingles vaccine: due, check with your insurance regarding coverage if interested    Covid-19:completed 3 vaccines   Advanced directives: Advance directive discussed with you today. Even though you declined this today please call our office should you change your mind and we can give you the proper paperwork for you to fill out.  Conditions/risks identified: hypertension  Next appointment: Follow up in one year for your annual wellness visit    Preventive Care 67 Years and Older, Female Preventive care refers to lifestyle choices and visits with your health care provider that can promote health and wellness. What does preventive care include? A yearly physical exam. This is also called an annual well check. Dental exams once or twice a year. Routine eye exams. Ask your health care provider how often you should have your eyes checked. Personal lifestyle choices, including: Daily care of your teeth and gums. Regular physical activity. Eating a healthy diet. Avoiding tobacco and drug use. Limiting alcohol use. Practicing safe sex. Taking low-dose aspirin every day. Taking vitamin and mineral supplements as recommended by your health care provider. What happens during an annual well check? The services  and screenings done by your health care provider during your annual well check will depend on your age, overall health, lifestyle risk factors, and family history of disease. Counseling  Your health care provider may ask you questions about your: Alcohol use. Tobacco use. Drug use. Emotional well-being. Home and relationship well-being. Sexual activity. Eating habits. History of falls. Memory and ability to understand (cognition). Work and work Statistician. Reproductive health. Screening  You may have the following tests or measurements: Height, weight, and BMI. Blood pressure. Lipid and cholesterol levels. These may be checked every 5 years, or more frequently if you are over 47 years old. Skin check. Lung cancer screening. You may have this screening every year starting at age 52 if you have a 30-pack-year history of smoking and currently smoke or have quit within the past 15 years. Fecal occult blood test (FOBT) of the stool. You may have this test every year starting at age 36. Flexible sigmoidoscopy or colonoscopy. You may have a sigmoidoscopy every 5 years or a colonoscopy every 10 years starting at age 77. Hepatitis C blood test. Hepatitis B blood test. Sexually transmitted disease (STD) testing. Diabetes screening. This is done by checking your blood sugar (glucose) after you have not eaten for a while (fasting). You may have this done every 1-3 years. Bone density scan. This is done to screen for osteoporosis. You may have this done starting at age 62. Mammogram. This may be done every 1-2 years. Talk to your health care provider about how often you should have regular mammograms. Talk with your health care provider about your test results, treatment options, and if necessary, the need for more tests. Vaccines  Your health care provider may recommend certain vaccines, such as: Influenza vaccine. This is  recommended every year. Tetanus, diphtheria, and acellular pertussis  (Tdap, Td) vaccine. You may need a Td booster every 10 years. Zoster vaccine. You may need this after age 32. Pneumococcal 13-valent conjugate (PCV13) vaccine. One dose is recommended after age 64. Pneumococcal polysaccharide (PPSV23) vaccine. One dose is recommended after age 42. Talk to your health care provider about which screenings and vaccines you need and how often you need them. This information is not intended to replace advice given to you by your health care provider. Make sure you discuss any questions you have with your health care provider. Document Released: 12/05/2015 Document Revised: 07/28/2016 Document Reviewed: 09/09/2015 Elsevier Interactive Patient Education  2017 [employer] Prevention in the Home Falls can cause injuries. They can happen to people of all ages. There are many things you can do to make your home safe and to help prevent falls. What can I do on the outside of my home? Regularly fix the edges of walkways and driveways and fix any cracks. Remove anything that might make you trip as you walk through a door, such as a raised step or threshold. Trim any bushes or trees on the path to your home. Use bright outdoor lighting. Clear any walking paths of anything that might make someone trip, such as rocks or tools. Regularly check to see if handrails are loose or broken. Make sure that both sides of any steps have handrails. Any raised decks and porches should have guardrails on the edges. Have any leaves, snow, or ice cleared regularly. Use sand or salt on walking paths during winter. Clean up any spills in your garage right away. This includes oil or grease spills. What can I do in the bathroom? Use night lights. Install grab bars by the toilet and in the tub and shower. Do not use towel bars as grab bars. Use non-skid mats or decals in the tub or shower. If you need to sit down in the shower, use a plastic, non-slip stool. Keep the floor dry. Clean  up any water that spills on the floor as soon as it happens. Remove soap buildup in the tub or shower regularly. Attach bath mats securely with double-sided non-slip rug tape. Do not have throw rugs and other things on the floor that can make you trip. What can I do in the bedroom? Use night lights. Make sure that you have a light by your bed that is easy to reach. Do not use any sheets or blankets that are too big for your bed. They should not hang down onto the floor. Have a firm chair that has side arms. You can use this for support while you get dressed. Do not have throw rugs and other things on the floor that can make you trip. What can I do in the kitchen? Clean up any spills right away. Avoid walking on wet floors. Keep items that you use a lot in easy-to-reach places. If you need to reach something above you, use a strong step stool that has a grab bar. Keep electrical cords out of the way. Do not use floor polish or wax that makes floors slippery. If you must use wax, use non-skid floor wax. Do not have throw rugs and other things on the floor that can make you trip. What can I do with my stairs? Do not leave any items on the stairs. Make sure that there are handrails on both sides of the stairs and use them. Fix handrails that are  broken or loose. Make sure that handrails are as long as the stairways. Check any carpeting to make sure that it is firmly attached to the stairs. Fix any carpet that is loose or worn. Avoid having throw rugs at the top or bottom of the stairs. If you do have throw rugs, attach them to the floor with carpet tape. Make sure that you have a light switch at the top of the stairs and the bottom of the stairs. If you do not have them, ask someone to add them for you. What else can I do to help prevent falls? Wear shoes that: Do not have high heels. Have rubber bottoms. Are comfortable and fit you well. Are closed at the toe. Do not wear sandals. If you  use a stepladder: Make sure that it is fully opened. Do not climb a closed stepladder. Make sure that both sides of the stepladder are locked into place. Ask someone to hold it for you, if possible. Clearly mark and make sure that you can see: Any grab bars or handrails. First and last steps. Where the edge of each step is. Use tools that help you move around (mobility aids) if they are needed. These include: Canes. Walkers. Scooters. Crutches. Turn on the lights when you go into a dark area. Replace any light bulbs as soon as they burn out. Set up your furniture so you have a clear path. Avoid moving your furniture around. If any of your floors are uneven, fix them. If there are any pets around you, be aware of where they are. Review your medicines with your doctor. Some medicines can make you feel dizzy. This can increase your chance of falling. Ask your doctor what other things that you can do to help prevent falls. This information is not intended to replace advice given to you by your health care provider. Make sure you discuss any questions you have with your health care provider. Document Released: 09/04/2009 Document Revised: 04/15/2016 Document Reviewed: 12/13/2014 Elsevier Interactive Patient Education  2017 [employer].

## 2021-06-12 NOTE — Progress Notes (Signed)
Subjective:   Brianna [name] is a 75 y.o. female who presents for Medicare Annual (Subsequent) preventive examination.  Review of Systems: N/A      I connected with the patient today by telephone and verified that I am speaking with the correct person using two identifiers. Location patient: home Location nurse: work Persons participating in the telephone visit: patient, nurse.   I discussed the limitations, risks, security and privacy concerns of performing an evaluation and management service by telephone and the availability of in person appointments. I also discussed with the patient that there may be a patient responsible charge related to this service. The patient expressed understanding and verbally consented to this telephonic visit.        Cardiac Risk Factors include: advanced age (>65mn, >>61women);hypertension     Objective:    Today's Vitals   There is no height or weight on file to calculate BMI.  Advanced Directives 06/12/2021 05/03/2019 04/19/2018 04/19/2017 03/23/2016 12/30/2015 03/05/2015  Does Patient Have a Medical Advance Directive? No No No No No No No  Does patient want to make changes to medical advance directive? No - Patient declined - - - - - -  Would patient like information on creating a medical advance directive? - No - Patient declined No - Patient declined - Yes - Educational materials given - No - patient declined information    Current Medications (verified) Outpatient Encounter Medications as of 06/12/2021  Medication Sig   Calcium Carbonate-Vitamin D (CALCIUM-VITAMIN D) 600-200 MG-UNIT CAPS Take 2 by mouth daily    Cholecalciferol (VITAMIN D3 PO) Take 250 mcg by mouth daily.   Cyanocobalamin (B-12) 2000 MCG TABS Take 1 tablet by mouth daily.   fexofenadine (ALLEGRA) 180 MG tablet Take otc as directed   fluticasone (FLONASE) 50 MCG/ACT nasal spray Place 2 sprays into both nostrils daily.   hydrochlorothiazide (HYDRODIURIL) 12.5 MG tablet Take 12.5  mg by mouth as needed (takes PRN for swelling).   Multiple Vitamin (MULTIVITAMIN) tablet Take 1 tablet by mouth daily.     omeprazole (PRILOSEC) 20 MG capsule TAKE 1 CAPSULE BY MOUTH EVERY DAY   No facility-administered encounter medications on file as of 06/12/2021.    Allergies (verified) Pravachol and Zetia [ezetimibe]   History: Past Medical History:  Diagnosis Date   Allergic rhinitis    Breast carcinoma ([place] 10/11   right- eventual double mastectomy in 2012   GERD (gastroesophageal reflux disease)    Hyperlipidemia    Osteoporosis    fosamax started 9/08   Shingles    Vitamin D deficiency    Past Surgical History:  Procedure Laterality Date   BREAST BIOPSY  10/11   invasive and in situ mam carcinoma   BREAST LUMPECTOMY  11/11   CATARACT EXTRACTION     does not remember when   MASTECTOMY  1/12   double- Dr. WDonne Hazel  MASTECTOMY, RADICAL Bilateral 11/2010   PARTIAL HYSTERECTOMY  1980's   non cancer, ovaries intact   WRIST FRACTURE SURGERY Left    in the 159's  Family History  Problem Relation Age of Onset   Dementia Mother    Cancer Father        esophageal   Diabetes Brother    Cancer Paternal Aunt        ovarian   Social History   Socioeconomic History   Marital status: Single    Spouse name: n/a   Number of children: 1   Years of  education: Not on file   Highest education level: Not on file  Occupational History   Occupation: retired    Fish farm manager: [employer]  Tobacco Use   Smoking status: Never   Smokeless tobacco: Never  Vaping Use   Vaping Use: Never used  Substance and Sexual Activity   Alcohol use: Yes    Alcohol/week: 0.0 standard drinks    Comment: wine rarely   Drug use: No   Sexual activity: Not Currently  Other Topics Concern   Not on file  Social History Narrative   Not on file   Social Determinants of Health   Financial Resource Strain: Low Risk    Difficulty of Paying Living Expenses: Not hard at all  Food  Insecurity: No Food Insecurity   Worried About Charity fundraiser in the Last Year: Never true   [place] in the Last Year: Never true  Transportation Needs: No Transportation Needs   Lack of Transportation (Medical): No   Lack of Transportation (Non-Medical): No  Physical Activity: Inactive   Days of Exercise per Week: 0 days   Minutes of Exercise per Session: 0 min  Stress: No Stress Concern Present   Feeling of Stress : Not at all  Social Connections: Not on file    Tobacco Counseling Counseling given: Not Answered   Clinical Intake:  Pre-visit preparation completed: Yes  Pain : 0-10 Pain Type: Chronic pain Pain Location: Hip Pain Orientation: Left Pain Descriptors / Indicators: Aching Pain Onset: More than a month ago Pain Frequency: Intermittent Pain Relieving Factors: acetaminophen  Pain Relieving Factors: acetaminophen  Nutritional Risks: None Diabetes: No  How often do you need to have someone help you when you read instructions, pamphlets, or other written materials from your doctor or pharmacy?: 1 - Never  Diabetic: No Nutrition Risk Assessment:  Has the patient had any N/V/D within the last 2 months?  No  Does the patient have any non-healing wounds?  No  Has the patient had any unintentional weight loss or weight gain?  No   Diabetes:  Is the patient diabetic?  No  If diabetic, was a CBG obtained today?   N/A Did the patient bring in their glucometer from home?   N/A How often do you monitor your CBG's? N/A.   Financial Strains and Diabetes Management:  Are you having any financial strains with the device, your supplies or your medication?  N/A .  Does the patient want to be seen by Chronic Care Management for management of their diabetes?   N/A Would the patient like to be referred to a Nutritionist or for Diabetic Management?   N/A    Interpreter Needed?: No  Information entered by :: CJohnson, RN   Activities of Daily Living In  your present state of health, do you have any difficulty performing the following activities: 06/12/2021  Hearing? N  Vision? N  Difficulty concentrating or making decisions? N  Walking or climbing stairs? N  Dressing or bathing? N  Doing errands, shopping? N  Preparing Food and eating ? N  Using the Toilet? N  In the past six months, have you accidently leaked urine? Y  Comment wears pads daily  Do you have problems with loss of bowel control? N  Managing your Medications? N  Managing your Finances? N  Housekeeping or managing your Housekeeping? N  Some recent data might be hidden    Patient Care Team: Tower, Wynelle Fanny, MD as PCP -  General Marica Otter, OD as Consulting Physician (Optometry)  Indicate any recent Medical Services you may have received from other than [facility] providers in the past year (date may be approximate).     Assessment:   This is a routine wellness examination for [place].  Hearing/Vision screen Vision Screening - Comments:: Patient gets annual eye exams   Dietary issues and exercise activities discussed: Current Exercise Habits: The patient does not participate in regular exercise at present, Exercise limited by: None identified   Goals Addressed             This Visit's Progress    Patient Stated       06/12/2021, I will maintain and continue medications as prescribed.        Depression Screen PHQ 2/9 Scores 06/12/2021 06/11/2020 05/03/2019 04/19/2018 04/19/2017 03/23/2016 10/12/2015  PHQ - 2 Score 0 0 0 0 0 0 0  PHQ- 9 Score 0 - 0 0 - - -    Fall Risk Fall Risk  06/12/2021 06/11/2020 05/03/2019 04/19/2018 04/19/2017  Falls in the past year? 0 0 1 Yes No  Comment - - fell after being jumped on by dog 2 falls due to loss of balance while doing yard work -  Number falls in past yr: 0 - 0 2 or more -  Injury with Fall? 0 - 1 No -  Risk for fall due to : Medication side effect - - - -  Follow up Falls prevention discussed;Falls evaluation completed Falls  evaluation completed - - -    FALL RISK PREVENTION PERTAINING TO THE HOME:  Any stairs in or around the home? Yes  If so, are there any without handrails? No  Home free of loose throw rugs in walkways, pet beds, electrical cords, etc? Yes  Adequate lighting in your home to reduce risk of falls? Yes   ASSISTIVE DEVICES UTILIZED TO PREVENT FALLS:  Life alert? No  Use of a cane, walker or w/c? No  Grab bars in the bathroom? No  Shower chair or bench in shower? No  Elevated toilet seat or a handicapped toilet? No   TIMED UP AND GO:  Was the test performed?  N/A telephone visit .    Cognitive Function: MMSE - Mini Mental State Exam 06/12/2021 05/03/2019 04/19/2018 04/19/2017 03/23/2016  Orientation to time '5 5 5 5 5  '$ Orientation to Place '5 5 5 5 5  '$ Registration '3 3 3 3 3  '$ Attention/ Calculation 5 0 0 0 0  Recall '3 3 3 3 2  '$ Language- name 2 objects - 0 0 0 0  Language- repeat '1 1 1 1 1  '$ Language- follow 3 step command - 0 '3 3 3  '$ Language- read & follow direction - 0 0 0 0  Write a sentence - 0 0 0 0  Copy design - 0 0 0 0  Total score - '17 20 20 19  '$ Mini Cog  Mini-Cog screen was completed. Maximum score is 22. A value of 0 denotes this part of the MMSE was not completed or the patient failed this part of the Mini-Cog screening.       Immunizations Immunization History  Administered Date(s) Administered   Fluad Quad(high Dose 65+) 09/27/2019, 09/13/2020   Influenza Split 11/05/2011, 11/06/2012   Influenza Whole 08/22/2008, 10/13/2009, 09/04/2010   Influenza,inj,Quad PF,6+ Mos 08/29/2013, 10/06/2015, 08/13/2016   PFIZER(Purple Top)SARS-COV-2 Vaccination 01/15/2020, 02/05/2020, 09/21/2020   PPD Test 08/29/2013   Pneumococcal Conjugate-13 03/19/2014   Pneumococcal Polysaccharide-23 10/21/2010  Td 03/04/1999, 10/13/2009    TDAP status: Due, Education has been provided regarding the importance of this vaccine. Advised may receive this vaccine at local pharmacy or Health Dept.  Aware to provide a copy of the vaccination record if obtained from local pharmacy or Health Dept. Verbalized acceptance and understanding.  Flu Vaccine status: Up to date  Pneumococcal vaccine status: Up to date  Covid-19 vaccine status: Completed 3 vaccines  Qualifies for Shingles Vaccine? Yes   Zostavax completed No   Shingrix Completed?: No.    Education has been provided regarding the importance of this vaccine. Patient has been advised to call insurance company to determine out of pocket expense if they have not yet received this vaccine. Advised may also receive vaccine at local pharmacy or Health Dept. Verbalized acceptance and understanding.  Screening Tests Health Maintenance  Topic Date Due   Zoster Vaccines- Shingrix (1 of 2) Never done   COVID-19 Vaccine (4 - Booster for Pfizer series) 12/22/2020   TETANUS/TDAP  06/12/2024 (Originally 10/14/2019)   INFLUENZA VACCINE  06/22/2021   DEXA SCAN  Completed   Hepatitis C Screening  Completed   PNA vac Low Risk Adult  Completed   HPV VACCINES  Aged Out    Health Maintenance  Health Maintenance Due  Topic Date Due   Zoster Vaccines- Shingrix (1 of 2) Never done   COVID-19 Vaccine (4 - Booster for Pfizer series) 12/22/2020    Colorectal cancer screening: No longer required.   Mammogram status: No longer required due to age.  Bone Density status: Completed 06/20/2020. Results reflect: Bone density results: OSTEOPOROSIS. Repeat every 2 years.  Lung Cancer Screening: (Low Dose CT Chest recommended if Age 34-80 years, 30 pack-year currently smoking OR have quit w/in 15 years.) does not qualify.    Additional Screening:  Hepatitis C Screening: does qualify; Completed 03/23/2016  Vision Screening: Recommended annual ophthalmology exams for early detection of glaucoma and other disorders of the eye. Is the patient up to date with their annual eye exam?  Yes  Who is the provider or what is the name of the office in which the  patient attends annual eye exams? [facility] Otter If pt is not established with a provider, would they like to be referred to a provider to establish care? No .   Dental Screening: Recommended annual dental exams for proper oral hygiene  Community Resource Referral / Chronic Care Management: CRR required this visit?  No   CCM required this visit?  No      Plan:     I have personally reviewed and noted the following in the patient's chart:   Medical and social history Use of alcohol, tobacco or illicit drugs  Current medications and supplements including opioid prescriptions.  Functional ability and status Nutritional status Physical activity Advanced directives List of other physicians Hospitalizations, surgeries, and ER visits in previous 12 months Vitals Screenings to include cognitive, depression, and falls Referrals and appointments  In addition, I have reviewed and discussed with patient certain preventive protocols, quality metrics, and best practice recommendations. A written personalized care plan for preventive services as well as general preventive health recommendations were provided to patient.   Due to this being a telephonic visit, the after visit summary with patients personalized plan was offered to patient via office or my-chart. Patient preferred to pick up at office at next visit or via mychart.   Andrez Grime, LPN   [id number]

## 2021-06-12 NOTE — Progress Notes (Signed)
PCP notes:  Health Maintenance: Shingrix- due   Abnormal Screenings: none   Patient concerns: Bilateral foot edema   Nurse concerns: none   Next PCP appt.: 06/22/2021 @ 9:30 am

## 2021-06-14 ENCOUNTER — Telehealth: Payer: Self-pay | Admitting: Family Medicine

## 2021-06-14 DIAGNOSIS — I10 Essential (primary) hypertension: Secondary | ICD-10-CM

## 2021-06-14 DIAGNOSIS — E559 Vitamin D deficiency, unspecified: Secondary | ICD-10-CM

## 2021-06-14 DIAGNOSIS — M810 Age-related osteoporosis without current pathological fracture: Secondary | ICD-10-CM

## 2021-06-14 DIAGNOSIS — E7800 Pure hypercholesterolemia, unspecified: Secondary | ICD-10-CM

## 2021-06-14 DIAGNOSIS — R7303 Prediabetes: Secondary | ICD-10-CM

## 2021-06-14 DIAGNOSIS — Z79899 Other long term (current) drug therapy: Secondary | ICD-10-CM

## 2021-06-14 NOTE — Telephone Encounter (Signed)
-----   Message from Cloyd Stagers, RT sent at 06/01/2021  8:49 AM EDT ----- Regarding: Lab Orders for Monday 7.25.2022 Please place lab orders for Monday 7.25.2022, office visit for physical on Monday 8.1.2022 Thank you, Dyke Maes RT(R)

## 2021-06-15 ENCOUNTER — Other Ambulatory Visit: Payer: Self-pay

## 2021-06-15 ENCOUNTER — Other Ambulatory Visit (INDEPENDENT_AMBULATORY_CARE_PROVIDER_SITE_OTHER): Payer: Medicare Other

## 2021-06-15 DIAGNOSIS — Z79899 Other long term (current) drug therapy: Secondary | ICD-10-CM | POA: Diagnosis not present

## 2021-06-15 DIAGNOSIS — R7303 Prediabetes: Secondary | ICD-10-CM | POA: Diagnosis not present

## 2021-06-15 DIAGNOSIS — E559 Vitamin D deficiency, unspecified: Secondary | ICD-10-CM | POA: Diagnosis not present

## 2021-06-15 DIAGNOSIS — E7800 Pure hypercholesterolemia, unspecified: Secondary | ICD-10-CM | POA: Diagnosis not present

## 2021-06-15 DIAGNOSIS — I10 Essential (primary) hypertension: Secondary | ICD-10-CM

## 2021-06-15 LAB — VITAMIN D 25 HYDROXY (VIT D DEFICIENCY, FRACTURES): VITD: 35.3 ng/mL (ref 30.00–100.00)

## 2021-06-15 LAB — CBC WITH DIFFERENTIAL/PLATELET
Basophils Absolute: 0 10*3/uL (ref 0.0–0.1)
Basophils Relative: 0.6 % (ref 0.0–3.0)
Eosinophils Absolute: 0.2 10*3/uL (ref 0.0–0.7)
Eosinophils Relative: 2 % (ref 0.0–5.0)
HCT: 40.8 % (ref 36.0–46.0)
Hemoglobin: 13.5 g/dL (ref 12.0–15.0)
Lymphocytes Relative: 30.9 % (ref 12.0–46.0)
Lymphs Abs: 2.3 10*3/uL (ref 0.7–4.0)
MCHC: 33.1 g/dL (ref 30.0–36.0)
MCV: 85.4 fl (ref 78.0–100.0)
Monocytes Absolute: 0.6 10*3/uL (ref 0.1–1.0)
Monocytes Relative: 8.6 % (ref 3.0–12.0)
Neutro Abs: 4.3 10*3/uL (ref 1.4–7.7)
Neutrophils Relative %: 57.9 % (ref 43.0–77.0)
Platelets: 280 10*3/uL (ref 150.0–400.0)
RBC: 4.78 Mil/uL (ref 3.87–5.11)
RDW: 14 % (ref 11.5–15.5)
WBC: 7.4 10*3/uL (ref 4.0–10.5)

## 2021-06-15 LAB — COMPREHENSIVE METABOLIC PANEL
ALT: 21 U/L (ref 0–35)
AST: 14 U/L (ref 0–37)
Albumin: 4.2 g/dL (ref 3.5–5.2)
Alkaline Phosphatase: 52 U/L (ref 39–117)
BUN: 17 mg/dL (ref 6–23)
CO2: 27 mEq/L (ref 19–32)
Calcium: 9.3 mg/dL (ref 8.4–10.5)
Chloride: 106 mEq/L (ref 96–112)
Creatinine, Ser: 0.96 mg/dL (ref 0.40–1.20)
GFR: 57.24 mL/min — ABNORMAL LOW (ref >60.00)
Glucose, Bld: 122 mg/dL — ABNORMAL HIGH (ref 70–99)
Potassium: 4.7 mEq/L (ref 3.5–5.1)
Sodium: 142 mEq/L (ref 135–145)
Total Bilirubin: 0.5 mg/dL (ref 0.2–1.2)
Total Protein: 6.2 g/dL (ref 6.0–8.3)

## 2021-06-15 LAB — LIPID PANEL
Cholesterol: 226 mg/dL — ABNORMAL HIGH (ref 0–200)
HDL: 42.9 mg/dL (ref >39.00)
LDL Cholesterol: 152 mg/dL — ABNORMAL HIGH (ref 0–99)
NonHDL: 182.97
Total CHOL/HDL Ratio: 5
Triglycerides: 154 mg/dL — ABNORMAL HIGH (ref 0.0–149.0)
VLDL: 30.8 mg/dL (ref 0.0–40.0)

## 2021-06-15 LAB — HEMOGLOBIN A1C: Hgb A1c MFr Bld: 5.8 % (ref 4.6–6.5)

## 2021-06-15 LAB — TSH: TSH: 4.62 u[IU]/mL (ref 0.35–5.50)

## 2021-06-15 LAB — VITAMIN B12: Vitamin B-12: 396 pg/mL (ref 211–911)

## 2021-06-22 ENCOUNTER — Ambulatory Visit (INDEPENDENT_AMBULATORY_CARE_PROVIDER_SITE_OTHER): Payer: Medicare Other | Admitting: Family Medicine

## 2021-06-22 ENCOUNTER — Other Ambulatory Visit: Payer: Self-pay

## 2021-06-22 ENCOUNTER — Encounter: Payer: Self-pay | Admitting: Family Medicine

## 2021-06-22 VITALS — BP 136/68 | HR 94 | Temp 98.3°F | Ht 60.5 in | Wt 140.4 lb

## 2021-06-22 DIAGNOSIS — E7800 Pure hypercholesterolemia, unspecified: Secondary | ICD-10-CM | POA: Diagnosis not present

## 2021-06-22 DIAGNOSIS — Z79899 Other long term (current) drug therapy: Secondary | ICD-10-CM

## 2021-06-22 DIAGNOSIS — M810 Age-related osteoporosis without current pathological fracture: Secondary | ICD-10-CM

## 2021-06-22 DIAGNOSIS — Z0000 Encounter for general adult medical examination without abnormal findings: Secondary | ICD-10-CM

## 2021-06-22 DIAGNOSIS — E559 Vitamin D deficiency, unspecified: Secondary | ICD-10-CM | POA: Diagnosis not present

## 2021-06-22 DIAGNOSIS — R7303 Prediabetes: Secondary | ICD-10-CM

## 2021-06-22 DIAGNOSIS — I10 Essential (primary) hypertension: Secondary | ICD-10-CM | POA: Diagnosis not present

## 2021-06-22 NOTE — Assessment & Plan Note (Signed)
dexa 7/21 reviewed Taking prolia  Past h/o tamoxifen No falls or fractures  Taking vit D Enc her to keep walking for exercise

## 2021-06-22 NOTE — Assessment & Plan Note (Signed)
Disc goals for lipids and reasons to control them Rev last labs with pt Rev low sat fat diet in detail Intolerant to statins and zetia in the past LDL is up again  Recently ate a steak, otherwise fairly low chol diet

## 2021-06-22 NOTE — Assessment & Plan Note (Signed)
Reviewed health habits including diet and exercise and skin cancer prevention Reviewed appropriate screening tests for age  Also reviewed health mt list, fam hx and immunization status , as well as social and family history   See HPI Labs reviewed  Interested in shingrix if affordable  Enc flu shot in the fall  dexa utd (no falls or fx)  colonscopy utd  No mammogram due to mastectomy for past breast cancer

## 2021-06-22 NOTE — Assessment & Plan Note (Signed)
Lab Results  Component Value Date   HGBA1C 5.8 06/15/2021   This is fairly stable disc imp of low glycemic diet and wt loss to prevent DM2

## 2021-06-22 NOTE — Patient Instructions (Addendum)
If you are interested in the new shingles vaccine (Shingrix) - call your local pharmacy to check on coverage and availability  If affordable, get on a wait list at your pharmacy to get the vaccine.    For cholesterol Avoid red meat/ fried foods/ egg yolks/ fatty breakfast meats/ butter, cheese and high fat dairy/ and shellfish   For diabetes prevention  Try to get most of your carbohydrates from produce (with the exception of white potatoes)  Eat less bread/pasta/rice/snack foods/cereals/sweets and other items from the middle of the grocery store (processed carbs)  Acetaminophen is ok for joint pain   Take care of yourself

## 2021-06-22 NOTE — Assessment & Plan Note (Signed)
D level is 35.3 Lab Results  Component Value Date   VITAMINB12 396 06/15/2021

## 2021-06-22 NOTE — Progress Notes (Signed)
Subjective:    Patient ID: Brianna [name], female    DOB: [birth year], 75 y.o.   MRN: [id number]  This visit occurred during the SARS-CoV-2 public health emergency.  Safety protocols were in place, including screening questions prior to the visit, additional usage of staff PPE, and extensive cleaning of exam room while observing appropriate contact time as indicated for disinfecting solutions.   HPI Here for health maintenance exam and to review chronic medical problems    Wt Readings from Last 3 Encounters:  06/22/21 140 lb 7 oz (63.7 kg)  06/11/20 140 lb (63.5 kg)  05/24/19 154 lb 1 oz (69.9 kg)   26.98 kg/m  Still working  Off for 2 weeks Has vacation planned in oct   Had amw on 7/22  Zoster status-interested but will check on coverage  Covid immunized  Gets flu shot in the fall  Dexa 7/21 OP Taking prolia  Past h/o tamoxifen  Falls- none  Fx-none  Supplements- taking D  Exercise - walking / working   Colonoscopy 1/16  Bilat mastectomy-no mammograms  No changes in self exam (has soreness in axillae occ after working)   HTN bp is stable today  No cp or palpitations or headaches or edema  No side effects to medicines  BP Readings from Last 3 Encounters:  06/22/21 136/68  04/18/21 (!) 142/64  04/07/21 138/76     Pulse Readings from Last 3 Encounters:  06/22/21 94  04/18/21 80  04/07/21 76  Taking hctz 12.5 mg daily    GERD-on ppi Lab Results  Component Value Date   VITAMINB12 396 06/15/2021   D level is 35.3  Hyperlipidemia Lab Results  Component Value Date   CHOL 226 (H) 06/15/2021   CHOL 203 (H) 06/06/2020   CHOL 223 (H) 05/03/2019   Lab Results  Component Value Date   HDL 42.90 06/15/2021   HDL 39.20 06/06/2020   HDL 37.60 (L) 05/03/2019   Lab Results  Component Value Date   LDLCALC 152 (H) 06/15/2021   LDLCALC 137 (H) 06/06/2020   LDLCALC 161 (H) 04/19/2018   Lab Results  Component Value Date   TRIG 154.0 (H) 06/15/2021   TRIG  133.0 06/06/2020   TRIG 213.0 (H) 05/03/2019   Lab Results  Component Value Date   CHOLHDL 5 06/15/2021   CHOLHDL 5 06/06/2020   CHOLHDL 6 05/03/2019   Lab Results  Component Value Date   LDLDIRECT 154.0 05/03/2019   LDLDIRECT 119.9 10/30/2012   LDLDIRECT 136.2 11/02/2011  Intol of statin and zetia  LDL is up - eating less nuts than usual  HDL is up  Very rarely eats fried food or red meat  Had one steak recently   Prediabetes Lab Results  Component Value Date   HGBA1C 5.8 06/15/2021  Glucose 122   Other labs Results for orders placed or performed in visit on 06/15/21  VITAMIN D 25 Hydroxy (Vit-D Deficiency, Fractures)  Result Value Ref Range   VITD 35.30 30.00 - 100.00 ng/mL  Vitamin B12  Result Value Ref Range   Vitamin B-12 396 211 - 911 pg/mL  TSH  Result Value Ref Range   TSH 4.62 0.35 - 5.50 uIU/mL  Lipid panel  Result Value Ref Range   Cholesterol 226 (H) 0 - 200 mg/dL   Triglycerides 154.0 (H) 0.0 - 149.0 mg/dL   HDL 42.90 >39.00 mg/dL   VLDL 30.8 0.0 - 40.0 mg/dL   LDL Cholesterol 152 (H) 0 - 99 mg/dL  Total CHOL/HDL Ratio 5    NonHDL 182.97   Hemoglobin A1c  Result Value Ref Range   Hgb A1c MFr Bld 5.8 4.6 - 6.5 %  Comprehensive metabolic panel  Result Value Ref Range   Sodium 142 135 - 145 mEq/L   Potassium 4.7 3.5 - 5.1 mEq/L   Chloride 106 96 - 112 mEq/L   CO2 27 19 - 32 mEq/L   Glucose, Bld 122 (H) 70 - 99 mg/dL   BUN 17 6 - 23 mg/dL   Creatinine, Ser 0.96 0.40 - 1.20 mg/dL   Total Bilirubin 0.5 0.2 - 1.2 mg/dL   Alkaline Phosphatase 52 39 - 117 U/L   AST 14 0 - 37 U/L   ALT 21 0 - 35 U/L   Total Protein 6.2 6.0 - 8.3 g/dL   Albumin 4.2 3.5 - 5.2 g/dL   GFR 57.24 (L) >60.00 mL/min   Calcium 9.3 8.4 - 10.5 mg/dL  CBC with Differential/Platelet  Result Value Ref Range   WBC 7.4 4.0 - 10.5 K/uL   RBC 4.78 3.87 - 5.11 Mil/uL   Hemoglobin 13.5 12.0 - 15.0 g/dL   HCT 40.8 36.0 - 46.0 %   MCV 85.4 78.0 - 100.0 fl   MCHC 33.1 30.0 - 36.0  g/dL   RDW 14.0 11.5 - 15.5 %   Platelets 280.0 150.0 - 400.0 K/uL   Neutrophils Relative % 57.9 43.0 - 77.0 %   Lymphocytes Relative 30.9 12.0 - 46.0 %   Monocytes Relative 8.6 3.0 - 12.0 %   Eosinophils Relative 2.0 0.0 - 5.0 %   Basophils Relative 0.6 0.0 - 3.0 %   Neutro Abs 4.3 1.4 - 7.7 K/uL   Lymphs Abs 2.3 0.7 - 4.0 K/uL   Monocytes Absolute 0.6 0.1 - 1.0 K/uL   Eosinophils Absolute 0.2 0.0 - 0.7 K/uL   Basophils Absolute 0.0 0.0 - 0.1 K/uL   v  Review of Systems  Constitutional:  Negative for activity change, appetite change, fatigue, fever and unexpected weight change.  HENT:  Negative for congestion, ear pain, rhinorrhea, sinus pressure and sore throat.   Eyes:  Negative for pain, redness and visual disturbance.  Respiratory:  Negative for cough, shortness of breath and wheezing.   Cardiovascular:  Negative for chest pain and palpitations.  Gastrointestinal:  Negative for abdominal pain, blood in stool, constipation and diarrhea.  Endocrine: Negative for polydipsia and polyuria.  Genitourinary:  Negative for dysuria, frequency and urgency.  Musculoskeletal:  Positive for arthralgias. Negative for back pain and myalgias.       Knees and L hip bother her at night   Skin:  Negative for pallor and rash.  Allergic/Immunologic: Negative for environmental allergies.  Neurological:  Negative for dizziness, syncope and headaches.  Hematological:  Negative for adenopathy. Does not bruise/bleed easily.  Psychiatric/Behavioral:  Negative for decreased concentration and dysphoric mood. The patient is not nervous/anxious.       Objective:   Physical Exam Constitutional:      General: She is not in acute distress.    Appearance: Normal appearance. She is well-developed and normal weight. She is not ill-appearing or diaphoretic.  HENT:     Head: Normocephalic and atraumatic.     Right Ear: Tympanic membrane, ear canal and external ear normal.     Left Ear: Tympanic membrane, ear  canal and external ear normal.     Nose: Nose normal. No congestion.     Mouth/Throat:     Mouth:  Mucous membranes are moist.     Pharynx: Oropharynx is clear. No posterior oropharyngeal erythema.  Eyes:     General: No scleral icterus.    Extraocular Movements: Extraocular movements intact.     Conjunctiva/sclera: Conjunctivae normal.     Pupils: Pupils are equal, round, and reactive to light.  Neck:     Thyroid: No thyromegaly.     Vascular: No carotid bruit or JVD.  Cardiovascular:     Rate and Rhythm: Normal rate and regular rhythm.     Pulses: Normal pulses.     Heart sounds: Normal heart sounds.    No gallop.  Pulmonary:     Effort: Pulmonary effort is normal. No respiratory distress.     Breath sounds: Normal breath sounds. No wheezing.     Comments: Good air exch Chest:     Chest wall: No tenderness.  Abdominal:     General: Bowel sounds are normal. There is no distension or abdominal bruit.     Palpations: Abdomen is soft. There is no mass.     Tenderness: There is no abdominal tenderness.     Hernia: No hernia is present.  Genitourinary:    Comments: S/p bilat mastectomy No masses/skin change or tenderness of chest wall or axillae  Musculoskeletal:        General: No tenderness. Normal range of motion.     Cervical back: Normal range of motion and neck supple. No rigidity. No muscular tenderness.     Right lower leg: No edema.     Left lower leg: No edema.  Lymphadenopathy:     Cervical: No cervical adenopathy.  Skin:    General: Skin is warm and dry.     Coloration: Skin is not pale.     Findings: No erythema or rash.     Comments: Solar lentigines diffusely Some sks  Neurological:     Mental Status: She is alert. Mental status is at baseline.     Cranial Nerves: No cranial nerve deficit.     Motor: No abnormal muscle tone.     Coordination: Coordination normal.     Gait: Gait normal.     Deep Tendon Reflexes: Reflexes are normal and symmetric. Reflexes  normal.  Psychiatric:        Mood and Affect: Mood normal.        Cognition and Memory: Cognition and memory normal.          Assessment & Plan:   Problem List Items Addressed This Visit       Cardiovascular and Mediastinum   Hypertension, essential    bp in fair control at this time  BP Readings from Last 1 Encounters:  06/22/21 136/68  No changes needed Most recent labs reviewed  Disc lifstyle change with low sodium diet and exercise  Plan to continue hctz 12.5 mg daily          Musculoskeletal and Integument   Osteoporosis    dexa 7/21 reviewed Taking prolia  Past h/o tamoxifen No falls or fractures  Taking vit D Enc her to keep walking for exercise          Other   Vitamin D deficiency    Vitamin D level is therapeutic with current supplementation Disc importance of this to bone and overall health Level of 35.3  Enc her not to skip doses        Hyperlipidemia    Disc goals for lipids and reasons to control them Rev last labs  with pt Rev low sat fat diet in detail Intolerant to statins and zetia in the past LDL is up again  Recently ate a steak, otherwise fairly low chol diet        Prediabetes    Lab Results  Component Value Date   HGBA1C 5.8 06/15/2021  This is fairly stable disc imp of low glycemic diet and wt loss to prevent DM2       Routine general medical examination at a health care facility - Primary    Reviewed health habits including diet and exercise and skin cancer prevention Reviewed appropriate screening tests for age  Also reviewed health mt list, fam hx and immunization status , as well as social and family history   See HPI Labs reviewed  Interested in shingrix if affordable  Enc flu shot in the fall  dexa utd (no falls or fx)  colonscopy utd  No mammogram due to mastectomy for past breast cancer         Current use of proton pump inhibitor    D level is 35.3 Lab Results  Component Value Date   VITAMINB12 396  06/15/2021

## 2021-06-22 NOTE — Assessment & Plan Note (Signed)
Vitamin D level is therapeutic with current supplementation Disc importance of this to bone and overall health Level of 35.3  Enc her not to skip doses

## 2021-06-22 NOTE — Assessment & Plan Note (Signed)
bp in fair control at this time  BP Readings from Last 1 Encounters:  06/22/21 136/68   No changes needed Most recent labs reviewed  Disc lifstyle change with low sodium diet and exercise  Plan to continue hctz 12.5 mg daily

## 2021-06-24 DIAGNOSIS — Z23 Encounter for immunization: Secondary | ICD-10-CM | POA: Diagnosis not present

## 2021-06-30 ENCOUNTER — Other Ambulatory Visit: Payer: Self-pay | Admitting: Family Medicine

## 2021-08-04 ENCOUNTER — Emergency Department (HOSPITAL_COMMUNITY): Payer: Medicare Other

## 2021-08-04 ENCOUNTER — Emergency Department (HOSPITAL_COMMUNITY)
Admission: EM | Admit: 2021-08-04 | Discharge: 2021-08-05 | Disposition: A | Discharge status: Home / Self Care | Payer: Medicare Other | Attending: Emergency Medicine | Admitting: Emergency Medicine

## 2021-08-04 ENCOUNTER — Encounter (HOSPITAL_COMMUNITY): Payer: Self-pay

## 2021-08-04 ENCOUNTER — Other Ambulatory Visit: Payer: Self-pay

## 2021-08-04 DIAGNOSIS — W07XXXA Fall from chair, initial encounter: Secondary | ICD-10-CM | POA: Diagnosis not present

## 2021-08-04 DIAGNOSIS — Z853 Personal history of malignant neoplasm of breast: Secondary | ICD-10-CM | POA: Insufficient documentation

## 2021-08-04 DIAGNOSIS — S0990XA Unspecified injury of head, initial encounter: Secondary | ICD-10-CM | POA: Diagnosis not present

## 2021-08-04 DIAGNOSIS — G319 Degenerative disease of nervous system, unspecified: Secondary | ICD-10-CM | POA: Diagnosis not present

## 2021-08-04 NOTE — ED Triage Notes (Signed)
Pt states that she was sitting in a chair and it flipped backwards making her hit the back of her head. Pt has a lump on the back of her head.

## 2021-08-04 NOTE — ED Provider Notes (Signed)
[facility] DEPT Provider Note   CSN: [id number] Arrival date & time: 08/04/21  2235     History Chief Complaint  Patient presents with   Fall   Head Injury    Brianna [name] is a 75 y.o. female.  Patient is a 75 year old female with past medical history of hyperlipidemia, GERD, breast cancer.  Patient presenting today for evaluation of fall.  She was sitting in her wheeled computer chair this evening when she tipped over and struck the back of her head on the floor.  She denies any loss of consciousness, but does report swelling to the back of her head and headache.  She denies any visual disturbances, weakness, or numbness.  She denies to me she is experiencing any neck pain.  She denies other injury.  The history is provided by the patient.      Past Medical History:  Diagnosis Date   Allergic rhinitis    Breast carcinoma ([place]) 10/11   right- eventual double mastectomy in 2012   GERD (gastroesophageal reflux disease)    Hyperlipidemia    Osteoporosis    fosamax started 9/08   Shingles    Vitamin D deficiency     Patient Active Problem List   Diagnosis Date Noted   Current use of proton pump inhibitor 06/14/2021   Hypertension, essential 06/05/2020   Thumb pain, left 01/30/2019   Plantar fasciitis 06/19/2018   Low back pain 06/19/2018   Abnormal urinalysis 06/19/2018   Routine general medical examination at a health care facility 05/10/2018   Chronic venous insufficiency 09/10/2016   Pedal edema 08/13/2016   Dyspepsia 04/12/2016   Prediabetes 03/21/2015   Nocturnal leg cramps 03/21/2015   Estrogen deficiency 03/21/2015   History of breast cancer 12/26/2014   Encounter for Medicare annual wellness exam 02/28/2014   Pre-employment examination 08/29/2013   Vitamin D deficiency 10/11/2008   Hyperlipidemia 07/07/2007   GERD 07/07/2007   Osteoporosis 07/07/2007   Fatty liver 07/07/2007    Past Surgical History:  Procedure  Laterality Date   BREAST BIOPSY  10/11   invasive and in situ mam carcinoma   BREAST LUMPECTOMY  11/11   CATARACT EXTRACTION     does not remember when   MASTECTOMY  1/12   double- Dr. Donne Hazel   MASTECTOMY, RADICAL Bilateral 11/2010   PARTIAL HYSTERECTOMY  1980's   non cancer, ovaries intact   WRIST FRACTURE SURGERY Left    in the 1990's     OB History   No obstetric history on file.     Family History  Problem Relation Age of Onset   Dementia Mother    Cancer Father        esophageal   Diabetes Brother    Cancer Paternal Aunt        ovarian    Social History   Tobacco Use   Smoking status: Never   Smokeless tobacco: Never  Vaping Use   Vaping Use: Never used  Substance Use Topics   Alcohol use: Yes    Alcohol/week: 0.0 standard drinks    Comment: wine rarely   Drug use: No    Home Medications Prior to Admission medications   Medication Sig Start Date End Date Taking? Authorizing Provider  Calcium Carbonate-Vitamin D (CALCIUM-VITAMIN D) 600-200 MG-UNIT CAPS Take 2 by mouth daily     [provider]  Cholecalciferol (VITAMIN D3 PO) Take 250 mcg by mouth daily.    [provider]  Cyanocobalamin (B-12)  2000 MCG TABS Take 1 tablet by mouth daily.    [provider]  fexofenadine (ALLEGRA) 180 MG tablet Take otc as directed    [provider]  fluticasone (FLONASE) 50 MCG/ACT nasal spray Place 2 sprays into both nostrils daily. 04/18/21   Hughie Closs, PA-C  hydrochlorothiazide (HYDRODIURIL) 12.5 MG tablet Take 12.5 mg by mouth as needed (takes PRN for swelling).    [provider]  Multiple Vitamin (MULTIVITAMIN) tablet Take 1 tablet by mouth daily.      [provider]  omeprazole (PRILOSEC) 20 MG capsule TAKE 1 CAPSULE BY MOUTH EVERY DAY 07/01/21   Tower, Wynelle Fanny, MD    Allergies    Pravachol and Zetia [ezetimibe]  Review of Systems   Review of Systems  All other systems reviewed and are  negative.  Physical Exam Updated Vital Signs BP (!) 175/66 (BP Location: Left Arm)   Pulse 85   Temp 97.6 F (36.4 C) (Oral)   Resp 16   Ht 5' (1.524 m)   Wt 63.5 kg   SpO2 95%   BMI 27.34 kg/m   Physical Exam Vitals and nursing note reviewed.  Constitutional:      General: She is not in acute distress.    Appearance: She is well-developed. She is not diaphoretic.  HENT:     Head: Normocephalic.     Comments: There is swelling to the occiput, but no palpable defect.  There are no lacerations or abrasions. Eyes:     Extraocular Movements: Extraocular movements intact.     Pupils: Pupils are equal, round, and reactive to light.  Cardiovascular:     Rate and Rhythm: Normal rate and regular rhythm.     Heart sounds: No murmur heard.   No friction rub. No gallop.  Pulmonary:     Effort: Pulmonary effort is normal. No respiratory distress.     Breath sounds: Normal breath sounds. No wheezing.  Abdominal:     General: Bowel sounds are normal. There is no distension.     Palpations: Abdomen is soft.     Tenderness: There is no abdominal tenderness.  Musculoskeletal:        General: Normal range of motion.     Cervical back: Normal range of motion and neck supple.  Skin:    General: Skin is warm and dry.  Neurological:     General: No focal deficit present.     Mental Status: She is alert and oriented to person, place, and time. Mental status is at baseline.     Cranial Nerves: No cranial nerve deficit.     Sensory: No sensory deficit.     Motor: No weakness.     Coordination: Coordination normal.    ED Results / Procedures / Treatments   Labs (all labs ordered are listed, but only abnormal results are displayed) Labs Reviewed - No data to display  EKG None  Radiology No results found.  Procedures Procedures   Medications Ordered in ED Medications - No data to display  ED Course  I have reviewed the triage vital signs and the nursing notes.  Pertinent  labs & imaging results that were available during my care of the patient were reviewed by me and considered in my medical decision making (see chart for details).    MDM Rules/Calculators/A&P  Patient presenting here with complaints of head injury.  She was sitting in a chair which fell backward and struck the back of her head.  She is complaining of pain in this area, but no loss of consciousness.  She denies any neck pain.  Patient's neurologic exam is nonfocal and there are no palpable skull defects on exam.  Head CT is negative.  Patient not taking any blood thinners and seems appropriate for discharge.  Final Clinical Impression(s) / ED Diagnoses Final diagnoses:  None    Rx / DC Orders ED Discharge Orders     None        Veryl Speak, MD 08/05/21 [phone]

## 2021-08-05 ENCOUNTER — Telehealth: Payer: Self-pay | Admitting: *Deleted

## 2021-08-05 ENCOUNTER — Ambulatory Visit (INDEPENDENT_AMBULATORY_CARE_PROVIDER_SITE_OTHER): Payer: Medicare Other | Admitting: Family Medicine

## 2021-08-05 ENCOUNTER — Encounter: Payer: Self-pay | Admitting: Family Medicine

## 2021-08-05 DIAGNOSIS — S060X0A Concussion without loss of consciousness, initial encounter: Secondary | ICD-10-CM | POA: Diagnosis not present

## 2021-08-05 DIAGNOSIS — S060X9A Concussion with loss of consciousness of unspecified duration, initial encounter: Secondary | ICD-10-CM | POA: Insufficient documentation

## 2021-08-05 DIAGNOSIS — S060XAA Concussion with loss of consciousness status unknown, initial encounter: Secondary | ICD-10-CM | POA: Insufficient documentation

## 2021-08-05 NOTE — Telephone Encounter (Signed)
Spoke to patient by telephone and was advised that she has a headache that is a pain level 2-3 now. Patient stated that she is feeling a little nauseated but thinks that is because she needs to eat something. Patient stated that she felt the same way when she woke up this morning and felt fine after eating. Patient denies dizziness or blurred vision. Patient stated that she already has an appointment scheduled today 08/05/21 with Dr. [name] at [facility].

## 2021-08-05 NOTE — Discharge Instructions (Addendum)
Take Tylenol 650 mg rotated with ibuprofen 400 mg every 4 hours as needed for pain.  Return to the emergency department for worsening headache, focal weakness or numbness, or other new and concerning symptoms.

## 2021-08-05 NOTE — Telephone Encounter (Signed)
I will see her then  

## 2021-08-05 NOTE — Telephone Encounter (Signed)
PLEASE NOTE: All timestamps contained within this report are represented as [place] Standard Time. CONFIDENTIALTY NOTICE: This fax transmission is intended only for the addressee. It contains information that is legally privileged, confidential or otherwise protected from use or disclosure. If you are not the intended recipient, you are strictly prohibited from reviewing, disclosing, copying using or disseminating any of this information or taking any action in reliance on or regarding this information. If you have received this fax in error, please notify us immediately by telephone so that we can arrange for its return to [place]. Phone: [phone], Toll-Free: [phone], Fax: [phone] Page: 1 of 2 Call Id: [id number] [place] [facility] - Client TELEPHONE ADVICE RECORD [facility] Patient Name: Brianna [name] Gender: Female DOB: 1946-08-18 Age: 75 Y 2 M 20 D Return Phone Number: [phone] (Primary), [phone] (Secondary) Address[place]  [address] Client [facility] - Client Client Site [place] - Day Contact Type Call Who Is Calling Patient / Member / Family / Caregiver Call Type Triage / Clinical Relationship To Patient Self Return Phone Number [phone] (Primary) Chief Complaint Head Injury (non urgent symptom) Reason for Call Symptomatic / Request for [place] states she fell and hit her head Translation No Nurse Assessment Nurse: Jimmye Norman, RN, [place] Date/Time (Eastern Time): 08/05/2021 10:46:08 AM Confirm and document reason for call. If symptomatic, describe symptoms. ---Caller states she fell and hit her head, states the computer chair flipped over. She was seen in the ED last night at [facility]. Instructed to contact her primary care doctor. States she had a CT scan last night. Does the patient have any new or  worsening symptoms? ---Yes Will a triage be completed? ---Yes Related visit to physician within the last 2 weeks? ---Yes Does the PT have any chronic conditions? (i.e. diabetes, asthma, this includes High risk factors for pregnancy, etc.) ---Yes List chronic conditions. ---reflux Is this a behavioral health or substance abuse call? ---No Guidelines Guideline Title Affirmed Question Affirmed Notes Nurse Date/Time (Eastern Time) Concussion (mTBI) Less Than 14 Days Ago Follow-up Call [1] Concussion symptoms staying SAME (not worse) AND [2] age > 39 years Jimmye Norman, RN, Loree Fee 08/05/2021 10:47:49 AM Disp. Time Eilene Ghazi Time) Disposition Final User 08/05/2021 10:57:47 AM See PCP within 24 Hours Yes Jimmye Norman, RN, Loree Fee PLEASE NOTE: All timestamps contained within this report are represented as [place] Standard Time. CONFIDENTIALTY NOTICE: This fax transmission is intended only for the addressee. It contains information that is legally privileged, confidential or otherwise protected from use or disclosure. If you are not the intended recipient, you are strictly prohibited from reviewing, disclosing, copying using or disseminating any of this information or taking any action in reliance on or regarding this information. If you have received this fax in error, please notify us immediately by telephone so that we can arrange for its return to [place]. Phone: [phone], Toll-Free: [phone], Fax: [phone] Page: 2 of 2 Call Id: [id number] [place] Disagree/Comply Comply Caller Understands Yes PreDisposition InappropriateToAsk Care Advice Given Per Guideline SEE PCP WITHIN 24 HOURS: * IF OFFICE WILL BE OPEN: You need to be examined within the next 24 hours. Call your doctor (or NP/PA) when the office opens and make an appointment. CALL BACK IF: * You become worse Comments User: Myriam Forehand, RN Date/Time (Eastern Time): 08/05/2021 10:59:21 AM Caller states she doesn't know where  her doctor is going as her office is closed for renovation.  User: Myriam Forehand, RN Date/Time Eilene Ghazi Time): 08/05/2021 11:00:05 AM RN listened to office message, Dr. [name] is seeing patients at [facility] per recording. User: Myriam Forehand, RN Date/Time (Eastern Time): 08/05/2021 11:02:07 AM [phone] phone number for [place] provided to caller. Referrals REFERRED TO PCP OFFICE

## 2021-08-05 NOTE — ED Notes (Signed)
Patient ambulatory to restroom independently

## 2021-08-05 NOTE — Patient Instructions (Addendum)
Take care of yourself  Have someone check in with you  Brain rest- take breaks in brain activity  When you are tired -stop what you are doing and take a break   I think you should take Thursday and Friday off  Let us know if you do not feel up to returning on Monday    Watch for a worse headache  Dizziness Tiredness Concentration problems  Balance problems  Nausea (or any vomiting)  New weakness or numbness  Personality change   Let us know please/go to ER if severe   Please keep [place] posted

## 2021-08-05 NOTE — Progress Notes (Signed)
Subjective:    Patient ID: Brianna [name], female    DOB: 05/27/1946, 75 y.o.   MRN: [id number]  This visit occurred during the SARS-CoV-2 public health emergency.  Safety protocols were in place, including screening questions prior to the visit, additional usage of staff PPE, and extensive cleaning of exam room while observing appropriate contact time as indicated for disinfecting solutions.   HPI Pt presents for ER f/u after a head injury   Wt Readings from Last 3 Encounters:  08/05/21 139 lb 2 oz (63.1 kg)  08/04/21 140 lb (63.5 kg)  06/22/21 140 lb 7 oz (63.7 kg)   26.72 kg/m  Pt presented to ER yesterday for head injury after tipping backwards in a wheeled computer chair (no loss of consciousness) Noted pain in back of head and headache She hit the hard wood floor  Had a goose egg and that is improved now  Reassuring exam   CT HEAD WO CONTRAST (5MM)  Result Date: 08/04/2021 CLINICAL DATA:  Fall with head trauma EXAM: CT HEAD WITHOUT CONTRAST TECHNIQUE: Contiguous axial images were obtained from the base of the skull through the vertex without intravenous contrast. COMPARISON:  None. FINDINGS: Brain: There is no mass, hemorrhage or extra-axial collection. The appearance of the white matter is normal for the patient's age. There is generalized atrophy. Vascular: No abnormal hyperdensity of the major intracranial arteries or dural venous sinuses. No intracranial atherosclerosis. Skull: The visualized skull base, calvarium and extracranial soft tissues are normal. Sinuses/Orbits: No fluid levels or advanced mucosal thickening of the visualized paranasal sinuses. No mastoid or middle ear effusion. The orbits are normal. IMPRESSION: Generalized atrophy without acute intracranial abnormality. Electronically Signed   By: Ulyses Jarred M.D.   On: 08/04/2021 23:50    BP Readings from Last 3 Encounters:  08/05/21 136/64  08/05/21 138/70  06/22/21 136/68   Pulse Readings from Last 3  Encounters:  08/05/21 77  08/05/21 82  06/22/21 94   Today is improved A little slower processing  Has a mild headache -right frontal   (the injury is on the right side)  Neck was a little stiff this am  No dizziness  A little queasy- on and off /no vomiting  No numbness  No new weakness  Balance may be a little off   Patient Active Problem List   Diagnosis Date Noted   Pre-employment examination 08/29/2013    Priority: Medium   Concussion 08/05/2021   Current use of proton pump inhibitor 06/14/2021   Hypertension, essential 06/05/2020   Thumb pain, left 01/30/2019   Plantar fasciitis 06/19/2018   Low back pain 06/19/2018   Abnormal urinalysis 06/19/2018   Routine general medical examination at a health care facility 05/10/2018   Chronic venous insufficiency 09/10/2016   Pedal edema 08/13/2016   Dyspepsia 04/12/2016   Prediabetes 03/21/2015   Nocturnal leg cramps 03/21/2015   Estrogen deficiency 03/21/2015   History of breast cancer 12/26/2014   Encounter for Medicare annual wellness exam 02/28/2014   Vitamin D deficiency 10/11/2008   Hyperlipidemia 07/07/2007   GERD 07/07/2007   Osteoporosis 07/07/2007   Fatty liver 07/07/2007   Past Medical History:  Diagnosis Date   Allergic rhinitis    Breast carcinoma ([place]) 10/11   right- eventual double mastectomy in 2012   GERD (gastroesophageal reflux disease)    Hyperlipidemia    Osteoporosis    fosamax started 9/08   Shingles    Vitamin D deficiency    Past Surgical  History:  Procedure Laterality Date   BREAST BIOPSY  10/11   invasive and in situ mam carcinoma   BREAST LUMPECTOMY  11/11   CATARACT EXTRACTION     does not remember when   MASTECTOMY  1/12   double- Dr. Donne Hazel   MASTECTOMY, RADICAL Bilateral 11/2010   PARTIAL HYSTERECTOMY  1980's   non cancer, ovaries intact   WRIST FRACTURE SURGERY Left    in the 1990's   Social History   Tobacco Use   Smoking status: Never   Smokeless tobacco: Never   Vaping Use   Vaping Use: Never used  Substance Use Topics   Alcohol use: Yes    Alcohol/week: 0.0 standard drinks    Comment: wine rarely   Drug use: No   Family History  Problem Relation Age of Onset   Dementia Mother    Cancer Father        esophageal   Diabetes Brother    Cancer Paternal Aunt        ovarian   Allergies  Allergen Reactions   Pravachol     Myalgias  (As of visit on 05/17/2013, patient is taking Pravachol, but continues to have muscle pain)   Zetia [Ezetimibe] Other (See Comments)    Ineffective, elevated liver enzymes   Current Outpatient Medications on File Prior to Visit  Medication Sig Dispense Refill   Calcium Carbonate-Vitamin D (CALCIUM-VITAMIN D) 600-200 MG-UNIT CAPS Take 2 by mouth daily      Cholecalciferol (VITAMIN D3 PO) Take 250 mcg by mouth daily.     Cyanocobalamin (B-12) 2000 MCG TABS Take 1 tablet by mouth daily.     fexofenadine (ALLEGRA) 180 MG tablet Take otc as directed     fluticasone (FLONASE) 50 MCG/ACT nasal spray Place 2 sprays into both nostrils daily. 16 mL 0   hydrochlorothiazide (HYDRODIURIL) 12.5 MG tablet Take 12.5 mg by mouth as needed (takes PRN for swelling).     Multiple Vitamin (MULTIVITAMIN) tablet Take 1 tablet by mouth daily.       omeprazole (PRILOSEC) 20 MG capsule TAKE 1 CAPSULE BY MOUTH EVERY DAY 90 capsule 3   No current facility-administered medications on file prior to visit.    Review of Systems  Constitutional:  Negative for activity change, appetite change, fatigue, fever and unexpected weight change.  HENT:  Negative for congestion, ear pain, rhinorrhea, sinus pressure and sore throat.   Eyes:  Negative for pain, redness and visual disturbance.  Respiratory:  Negative for cough, shortness of breath and wheezing.   Cardiovascular:  Negative for chest pain and palpitations.  Gastrointestinal:  Negative for abdominal pain, blood in stool, constipation and diarrhea.  Endocrine: Negative for polydipsia and  polyuria.  Genitourinary:  Negative for dysuria, frequency and urgency.  Musculoskeletal:  Negative for arthralgias, back pain and myalgias.  Skin:  Negative for pallor and rash.  Allergic/Immunologic: Negative for environmental allergies.  Neurological:  Positive for headaches. Negative for dizziness, syncope, speech difficulty, weakness, light-headedness and numbness.  Hematological:  Negative for adenopathy. Does not bruise/bleed easily.  Psychiatric/Behavioral:  Negative for decreased concentration and dysphoric mood. The patient is not nervous/anxious.       Objective:   Physical Exam Constitutional:      General: She is not in acute distress.    Appearance: Normal appearance. She is normal weight. She is not ill-appearing or diaphoretic.  HENT:     Head: Normocephalic.     Comments: Some mild swelling over 2 by 3  cm area R post scalp with ecchymosis and no skin interruption  Mildly tender    Mouth/Throat:     Mouth: Mucous membranes are moist.     Pharynx: Oropharynx is clear.  Eyes:     General:        Right eye: No discharge.        Left eye: No discharge.     Extraocular Movements: Extraocular movements intact.     Conjunctiva/sclera: Conjunctivae normal.     Pupils: Pupils are equal, round, and reactive to light.  Cardiovascular:     Rate and Rhythm: Normal rate and regular rhythm.     Heart sounds: Normal heart sounds.  Pulmonary:     Effort: Pulmonary effort is normal. No respiratory distress.     Breath sounds: Normal breath sounds. No wheezing.  Musculoskeletal:     Cervical back: Normal range of motion and neck supple. No rigidity or tenderness.  Lymphadenopathy:     Cervical: No cervical adenopathy.  Skin:    Findings: No erythema or rash.  Neurological:     Mental Status: She is alert.     Cranial Nerves: Cranial nerves are intact.     Sensory: Sensation is intact.     Motor: Motor function is intact.     Coordination: Coordination is intact. Romberg  sign negative. Finger-Nose-Finger Test normal.     Gait: Gait is intact.     Comments: Normal gait steady          Assessment & Plan:   Problem List Items Addressed This Visit       Other   Concussion    Fall with posterior head impact yesterday and seen in ER Reviewed hospital records, lab results and studies in detail  CT and exam were both reassuring  Non focal neuro exam today Disc post concussion syndrome in detail today  Will watch out for new or worsening symptoms as outlined in AVS and also handout  Family will watch closely also  Will practice brain rest as long as needed  Written out of work until Monday Update if not starting to improve in a week or if worsening

## 2021-08-05 NOTE — Assessment & Plan Note (Signed)
Fall with posterior head impact yesterday and seen in ER Reviewed hospital records, lab results and studies in detail  CT and exam were both reassuring  Non focal neuro exam today Disc post concussion syndrome in detail today  Will watch out for new or worsening symptoms as outlined in AVS and also handout  Family will watch closely also  Will practice brain rest as long as needed  Written out of work until Monday Update if not starting to improve in a week or if worsening

## 2021-10-18 DIAGNOSIS — Z23 Encounter for immunization: Secondary | ICD-10-CM | POA: Diagnosis not present

## 2021-12-09 ENCOUNTER — Telehealth: Payer: Self-pay

## 2021-12-09 DIAGNOSIS — M810 Age-related osteoporosis without current pathological fracture: Secondary | ICD-10-CM

## 2021-12-09 NOTE — Telephone Encounter (Signed)
Benefit verification submitted-pending Next injection due after 11/27/21

## 2021-12-11 ENCOUNTER — Telehealth: Payer: Self-pay | Admitting: Family Medicine

## 2021-12-11 NOTE — Telephone Encounter (Signed)
Pt called stating that she had a Physical on 06/22/21 and a Medicare Well Phone Call Visit on 06/12/21. Pt states that they billed her both as Physical Visit. Pt states that she can only have one Physical Visit a year. Pt states that someone needs to change the code. Please advise.

## 2021-12-14 NOTE — Telephone Encounter (Signed)
Will forward this to my Audiological scientist, Amy, to investigate and follow up with patient.

## 2021-12-16 NOTE — Telephone Encounter (Signed)
OOP cost is $0 Patient advised and Lab appointment 12/18/21 and Nurse visit on 12/23/21.

## 2021-12-16 NOTE — Addendum Note (Signed)
Addended by: Kris Mouton on: 12/16/2021 04:09 PM   Modules accepted: Orders

## 2021-12-18 ENCOUNTER — Other Ambulatory Visit: Payer: Self-pay

## 2021-12-18 ENCOUNTER — Other Ambulatory Visit (INDEPENDENT_AMBULATORY_CARE_PROVIDER_SITE_OTHER): Payer: Medicare Other

## 2021-12-18 DIAGNOSIS — M810 Age-related osteoporosis without current pathological fracture: Secondary | ICD-10-CM | POA: Diagnosis not present

## 2021-12-18 NOTE — Addendum Note (Signed)
Addended by: Leeanne Rio on: 12/18/2021 02:34 PM   Modules accepted: Orders

## 2021-12-18 NOTE — Telephone Encounter (Signed)
Hi [name], Please see below.  Brianna [name] Guarantor ID: [id number] DOS: 7.22.22  Visit with Wellness coach CPT code: S3074  DOS: 8.1.22 Annual visit with Dr. [name] Code: [phone]  Medicare denied the charge for DOS: 8.1.22   Amount in self-pay: $325  Please review for correction. Patient did call the billing office, and it was marked as appropriate billing, no change

## 2021-12-19 LAB — BASIC METABOLIC PANEL
BUN/Creatinine Ratio: 10 — ABNORMAL LOW (ref 12–28)
BUN: 10 mg/dL (ref 8–27)
CO2: 26 mmol/L (ref 20–29)
Calcium: 9.9 mg/dL (ref 8.7–10.3)
Chloride: 104 mmol/L (ref 96–106)
Creatinine, Ser: 0.99 mg/dL (ref 0.57–1.00)
Glucose: 118 mg/dL — ABNORMAL HIGH (ref 70–99)
Potassium: 4.8 mmol/L (ref 3.5–5.2)
Sodium: 145 mmol/L — ABNORMAL HIGH (ref 134–144)
eGFR: 59 mL/min/{1.73_m2} — ABNORMAL LOW (ref >59)

## 2021-12-21 IMAGING — CT CT HEAD W/O CM
3 of 4 series · 15 of 47 positions shown, 18 images · non-contrast
Comparison: None.

CLINICAL DATA: Fall with head trauma

EXAM:
CT HEAD WITHOUT CONTRAST
TECHNIQUE: Contiguous axial images were obtained from the base of the skull
through the vertex without intravenous contrast.

[Series 4: coronal soft tissue · coronal · 0.34mm/px · 3 of 68 slices shown]
[im 23/68  brain]
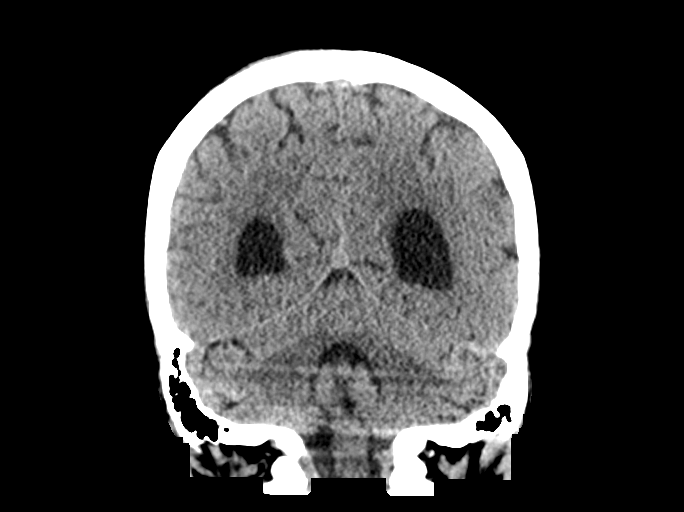
[im 30/68  brain]
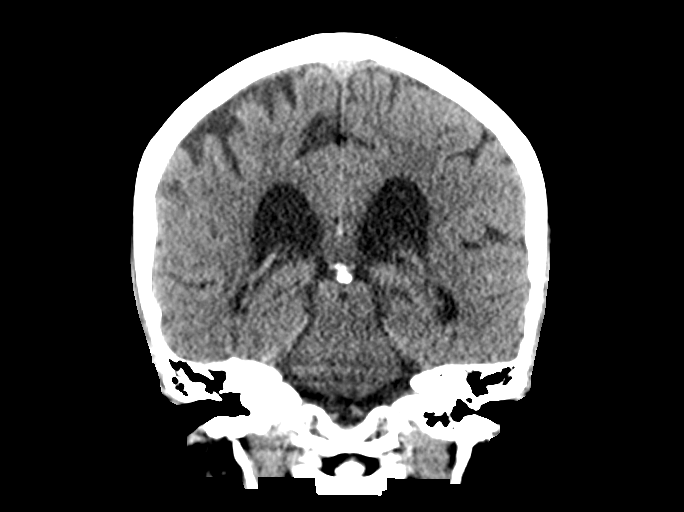
[im 38/68  brain]
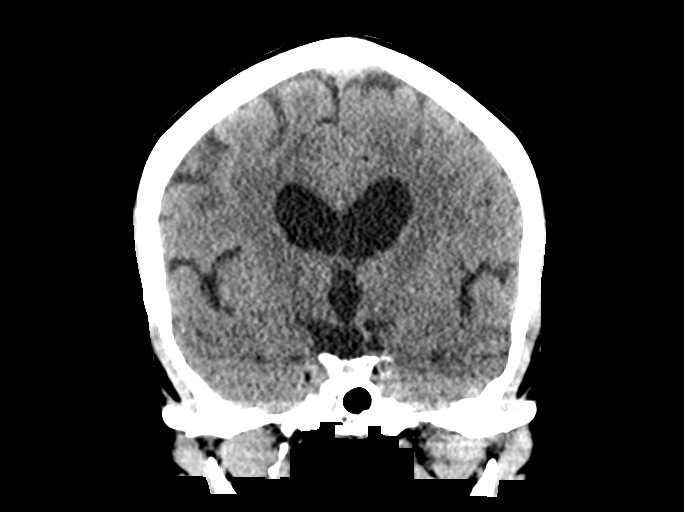

[Series 5: sagittal soft tissue · sagittal · 0.33mm/px · 3 of 49 slices shown]
[im 17/49  brain]
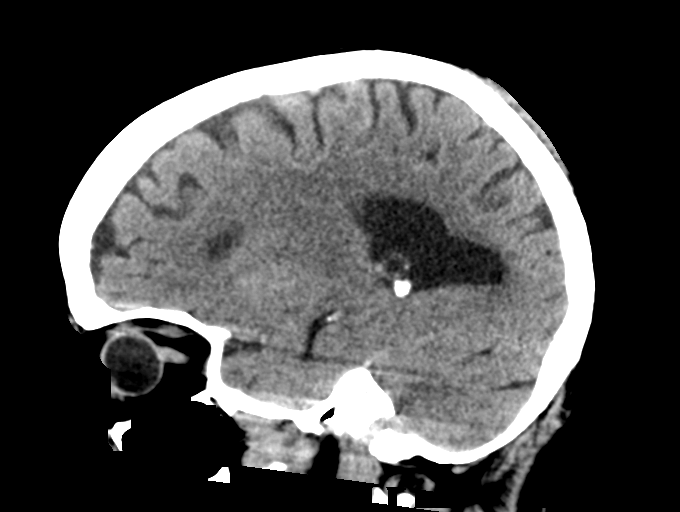
[im 25/49  brain]
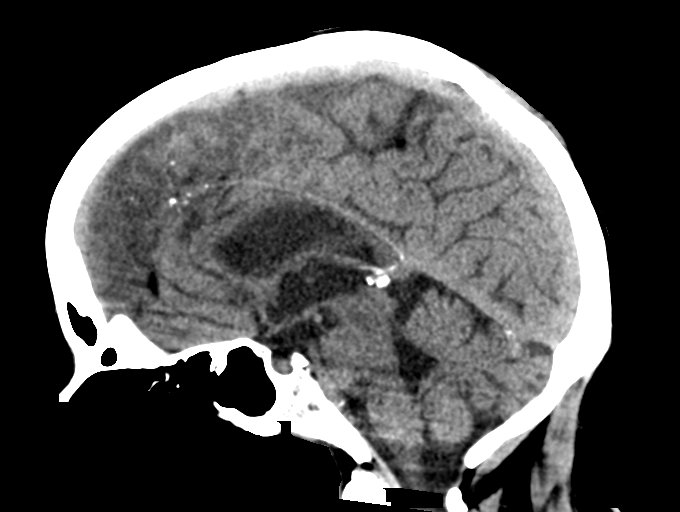
[im 33/49  brain]
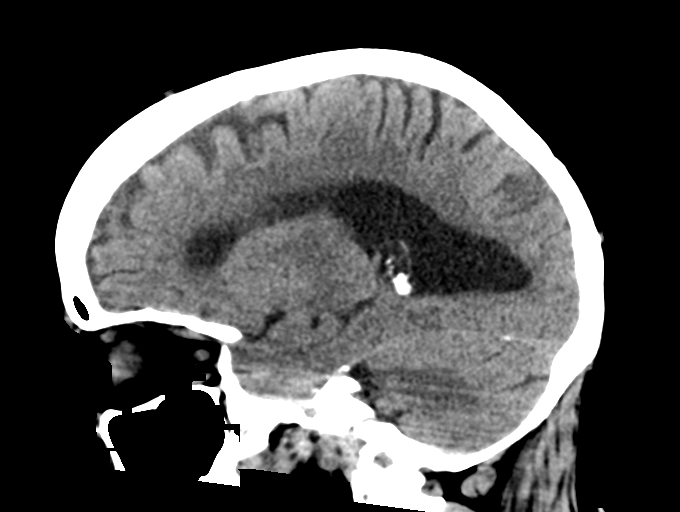

[Series 7: head wo · axial · 0.47mm/px · z∈[-134,+6]mm · 9 of 32 slices shown, 12 images]
[im 2/32  brain]
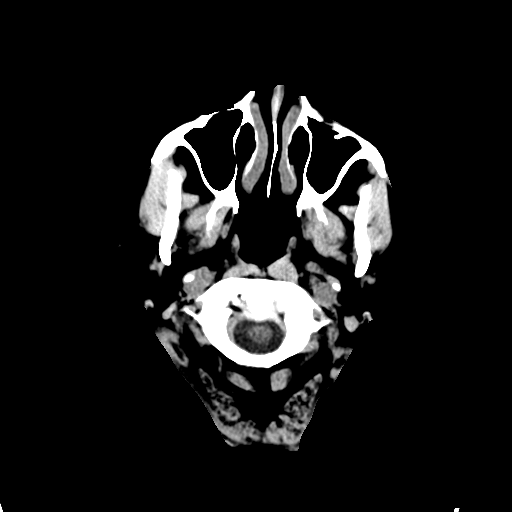
[im 2/32  bone]
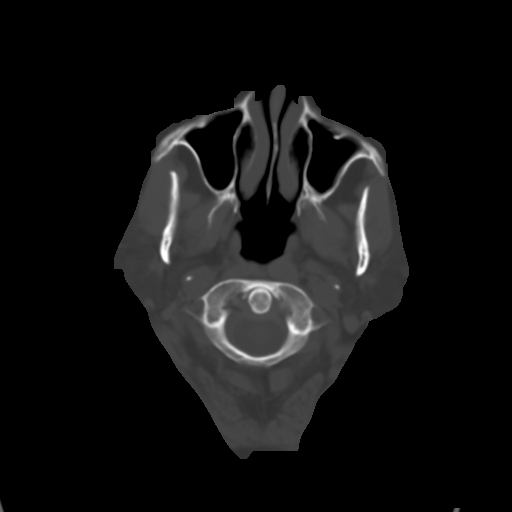
[im 6/32  brain]
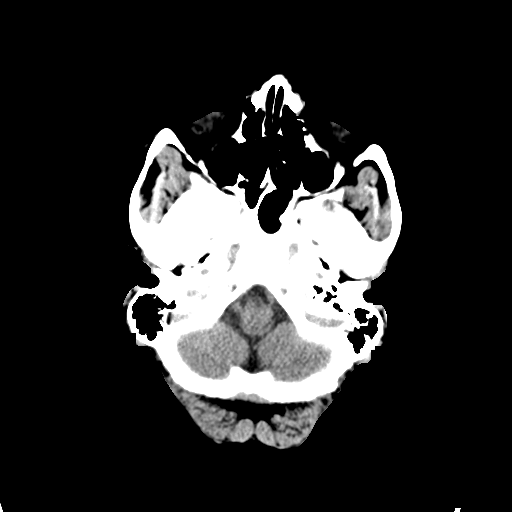
[im 9/32  brain]
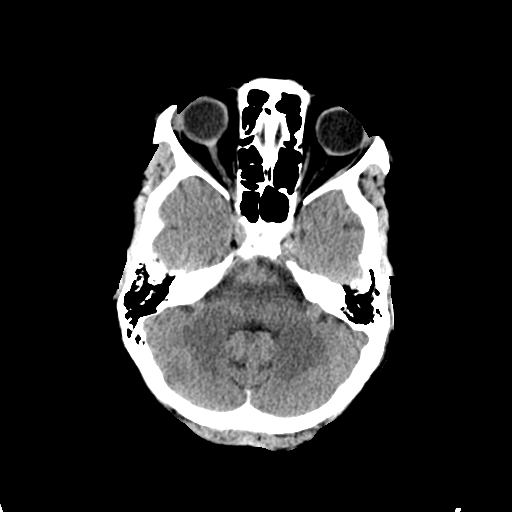
[im 13/32  brain]
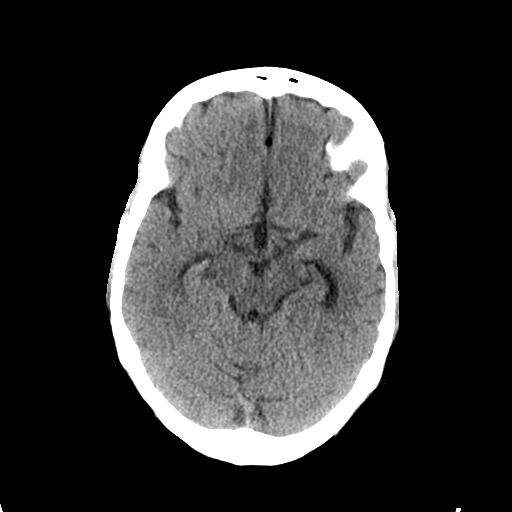
[im 16/32  brain]
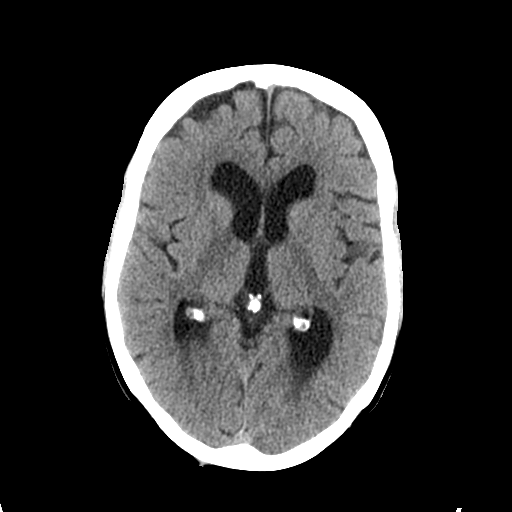
[im 16/32  bone]
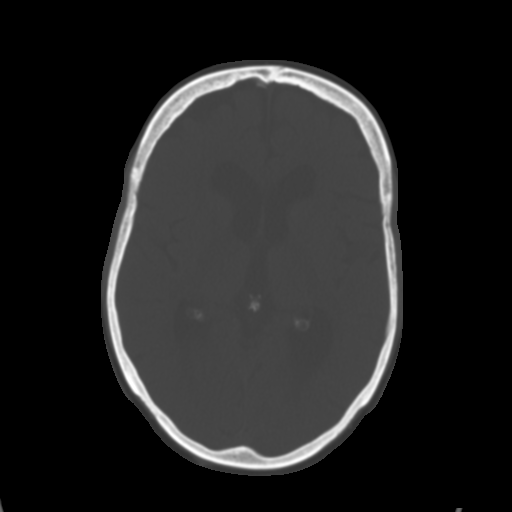
[im 19/32  brain]
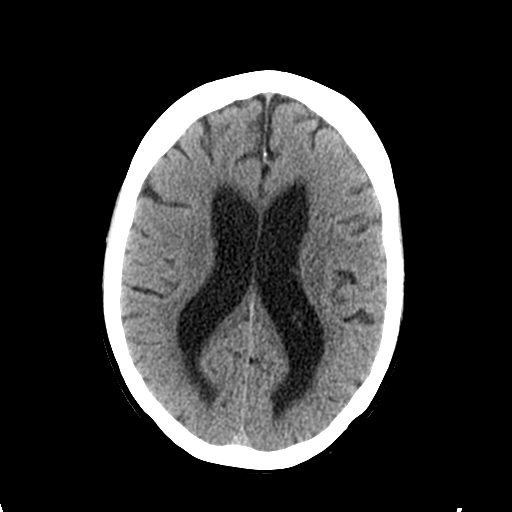
[im 23/32  brain]
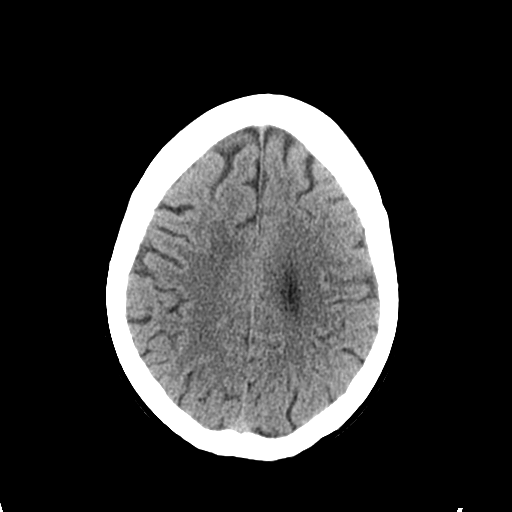
[im 26/32  brain]
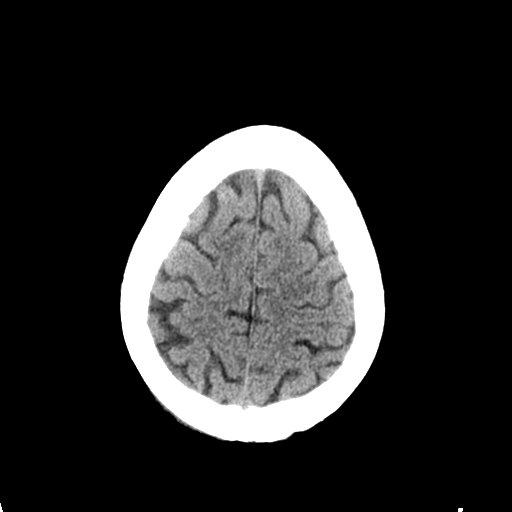
[im 30/32  brain]
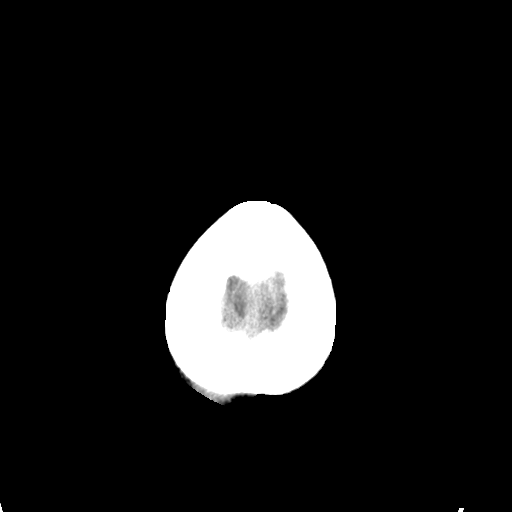
[im 30/32  bone]
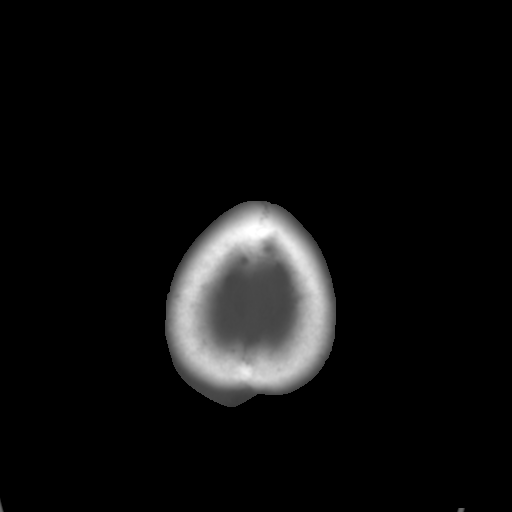

[15 of 47 positions shown; findings below may reference images not displayed]

FINDINGS: Brain: There is no mass, hemorrhage or extra-axial collection. The
appearance of the white matter is normal for the patient's age.
There is generalized atrophy.

Vascular: No abnormal hyperdensity of the major intracranial
arteries or dural venous sinuses. No intracranial atherosclerosis.

Skull: The visualized skull base, calvarium and extracranial soft
tissues are normal.

Sinuses/Orbits: No fluid levels or advanced mucosal thickening of
the visualized paranasal sinuses. No mastoid or middle ear effusion.
The orbits are normal.
IMPRESSION: Generalized atrophy without acute intracranial abnormality.

## 2021-12-22 NOTE — Telephone Encounter (Signed)
Charge has been corrected, lvm to update the patient.

## 2021-12-23 ENCOUNTER — Ambulatory Visit (INDEPENDENT_AMBULATORY_CARE_PROVIDER_SITE_OTHER): Payer: Medicare Other

## 2021-12-23 ENCOUNTER — Other Ambulatory Visit: Payer: Self-pay

## 2021-12-23 DIAGNOSIS — M810 Age-related osteoporosis without current pathological fracture: Secondary | ICD-10-CM | POA: Diagnosis not present

## 2021-12-23 MED ORDER — DENOSUMAB 60 MG/ML ~~LOC~~ SOSY
60.0000 mg | PREFILLED_SYRINGE | Freq: Once | SUBCUTANEOUS | Status: AC
  Administered 2021-12-23: 60 mg via SUBCUTANEOUS

## 2021-12-23 NOTE — Telephone Encounter (Signed)
Labs done. CrCl is  47.4 mL/min. Calcium normal at 9.9

## 2021-12-23 NOTE — Progress Notes (Signed)
Per orders of Dr. [name], an injection of Prolia was given by Ophelia Shoulder, CMA in the right arm. Patient tolerated injection well.

## 2022-01-07 ENCOUNTER — Ambulatory Visit
Admission: EM | Admit: 2022-01-07 | Discharge: 2022-01-07 | Disposition: A | Discharge status: Home / Self Care | Payer: Medicare Other | Attending: Emergency Medicine | Admitting: Emergency Medicine

## 2022-01-07 ENCOUNTER — Other Ambulatory Visit: Payer: Self-pay

## 2022-01-07 ENCOUNTER — Encounter: Payer: Self-pay | Admitting: Emergency Medicine

## 2022-01-07 DIAGNOSIS — R051 Acute cough: Secondary | ICD-10-CM | POA: Diagnosis not present

## 2022-01-07 DIAGNOSIS — R490 Dysphonia: Secondary | ICD-10-CM

## 2022-01-07 DIAGNOSIS — B349 Viral infection, unspecified: Secondary | ICD-10-CM | POA: Diagnosis not present

## 2022-01-07 LAB — POCT RAPID STREP A (OFFICE): Rapid Strep A Screen: NEGATIVE

## 2022-01-07 NOTE — ED Provider Notes (Signed)
Brianna [name]    CSN: [id number] Arrival date & time: 01/07/22  1104      History   Chief Complaint Chief Complaint  Patient presents with   Cough   Nasal Congestion    HPI Brianna [name] is a 76 y.o. female.  Patient presents with 2-day history of hoarse voice, congestion, cough.  She had a sore throat but this has improved.  She denies fever, chills, rash, shortness of breath, chest pain, vomiting, diarrhea, or other symptoms.  Treatment at home with Tylenol.  Her medical history includes hypertension, allergic rhinitis, breast cancer.  The history is provided by the patient and medical records.   Past Medical History:  Diagnosis Date   Allergic rhinitis    Breast carcinoma ([place]) 10/11   right- eventual double mastectomy in 2012   GERD (gastroesophageal reflux disease)    Hyperlipidemia    Osteoporosis    fosamax started 9/08   Shingles    Vitamin D deficiency     Patient Active Problem List   Diagnosis Date Noted   Concussion 08/05/2021   Current use of proton pump inhibitor 06/14/2021   Hypertension, essential 06/05/2020   Thumb pain, left 01/30/2019   Plantar fasciitis 06/19/2018   Low back pain 06/19/2018   Abnormal urinalysis 06/19/2018   Routine general medical examination at a health care facility 05/10/2018   Chronic venous insufficiency 09/10/2016   Pedal edema 08/13/2016   Dyspepsia 04/12/2016   Prediabetes 03/21/2015   Nocturnal leg cramps 03/21/2015   Estrogen deficiency 03/21/2015   History of breast cancer 12/26/2014   Encounter for Medicare annual wellness exam 02/28/2014   Pre-employment examination 08/29/2013   Vitamin D deficiency 10/11/2008   Hyperlipidemia 07/07/2007   GERD 07/07/2007   Osteoporosis 07/07/2007   Fatty liver 07/07/2007    Past Surgical History:  Procedure Laterality Date   BREAST BIOPSY  10/11   invasive and in situ mam carcinoma   BREAST LUMPECTOMY  11/11   CATARACT EXTRACTION     does not remember when    MASTECTOMY  1/12   double- Dr. Donne Hazel   MASTECTOMY, RADICAL Bilateral 11/2010   PARTIAL HYSTERECTOMY  1980's   non cancer, ovaries intact   WRIST FRACTURE SURGERY Left    in the 1990's    OB History   No obstetric history on file.      Home Medications    Prior to Admission medications   Medication Sig Start Date End Date Taking? Authorizing Provider  Calcium Carbonate-Vitamin D (CALCIUM-VITAMIN D) 600-200 MG-UNIT CAPS Take 2 by mouth daily     [provider]  Cholecalciferol (VITAMIN D3 PO) Take 250 mcg by mouth daily.    [provider]  Cyanocobalamin (B-12) 2000 MCG TABS Take 1 tablet by mouth daily.    [provider]  fexofenadine (ALLEGRA) 180 MG tablet Take otc as directed    [provider]  fluticasone (FLONASE) 50 MCG/ACT nasal spray Place 2 sprays into both nostrils daily. 04/18/21   Hughie Closs, PA-C  hydrochlorothiazide (HYDRODIURIL) 12.5 MG tablet Take 12.5 mg by mouth as needed (takes PRN for swelling).    [provider]  Multiple Vitamin (MULTIVITAMIN) tablet Take 1 tablet by mouth daily.      [provider]  omeprazole (PRILOSEC) 20 MG capsule TAKE 1 CAPSULE BY MOUTH EVERY DAY 07/01/21   Tower, Wynelle Fanny, MD    Family History Family History  Problem Relation Age of Onset   Dementia  Mother    Cancer Father        esophageal   Diabetes Brother    Cancer Paternal Aunt        ovarian    Social History Social History   Tobacco Use   Smoking status: Never   Smokeless tobacco: Never  Vaping Use   Vaping Use: Never used  Substance Use Topics   Alcohol use: Yes    Alcohol/week: 0.0 standard drinks    Comment: wine rarely   Drug use: No     Allergies   Pravachol and Zetia [ezetimibe]   Review of Systems Review of Systems  Constitutional:  Negative for chills and fever.  HENT:  Positive for congestion, postnasal drip, sore throat and voice change. Negative for ear pain.    Respiratory:  Positive for cough. Negative for shortness of breath.   Cardiovascular:  Negative for chest pain and palpitations.  Gastrointestinal:  Negative for diarrhea and vomiting.  Skin:  Negative for color change and rash.  All other systems reviewed and are negative.   Physical Exam Triage Vital Signs ED Triage Vitals  Enc Vitals Group     BP      Pulse      Resp      Temp      Temp src      SpO2      Weight      Height      Head Circumference      Peak Flow      Pain Score      Pain Loc      Pain Edu?      Excl. in [place]?    No data found.  Updated Vital Signs BP (!) 143/78    Pulse 84    Temp 98.4 F (36.9 C)    Resp 18    SpO2 96%   Visual Acuity Right Eye Distance:   Left Eye Distance:   Bilateral Distance:    Right Eye Near:   Left Eye Near:    Bilateral Near:     Physical Exam Vitals and nursing note reviewed.  Constitutional:      General: She is not in acute distress.    Appearance: She is well-developed. She is not ill-appearing.  HENT:     Right Ear: Tympanic membrane normal.     Left Ear: Tympanic membrane normal.     Nose: Rhinorrhea present.     Mouth/Throat:     Mouth: Mucous membranes are moist.     Pharynx: Oropharynx is clear.  Cardiovascular:     Rate and Rhythm: Normal rate and regular rhythm.     Heart sounds: Normal heart sounds.  Pulmonary:     Effort: Pulmonary effort is normal. No respiratory distress.     Breath sounds: Normal breath sounds.  Musculoskeletal:     Cervical back: Neck supple.  Skin:    General: Skin is warm and dry.  Neurological:     Mental Status: She is alert.  Psychiatric:        Mood and Affect: Mood normal.        Behavior: Behavior normal.     UC Treatments / Results  Labs (all labs ordered are listed, but only abnormal results are displayed) Labs Reviewed  COVID-19, FLU A+B AND RSV  POCT RAPID STREP A (OFFICE)    EKG   Radiology No results found.  Procedures Procedures  (including critical care time)  Medications Ordered in UC Medications -  No data to display  Initial Impression / Assessment and Plan / UC Course  I have reviewed the triage vital signs and the nursing notes.  Pertinent labs & imaging results that were available during my care of the patient were reviewed by me and considered in my medical decision making (see chart for details).  Cough, viral illness, hoarseness of voice.  Rapid strep negative.  COVID, Flu, RSV pending.  Instructed patient to self quarantine per CDC guidelines.  Discussed symptomatic treatment including Tylenol, rest, hydration.  Instructed patient to follow up with PCP if symptoms are not improving.  Patient agrees to plan of care.    Final Clinical Impressions(s) / UC Diagnoses   Final diagnoses:  Acute cough  Viral illness  Hoarseness of voice     Discharge Instructions      Your strep test is negative.  Your COVID, Flu, and RSV tests are pending.  You should self quarantine until the test results are back.    Take Tylenol as needed for fever or discomfort.  Rest and keep yourself hydrated.    Follow-up with your primary care provider if your symptoms are not improving.         ED Prescriptions   None    PDMP not reviewed this encounter.   Sharion Balloon, NP 01/07/22 1229

## 2022-01-07 NOTE — Discharge Instructions (Addendum)
Your strep test is negative.  Your COVID, Flu, and RSV tests are pending.  You should self quarantine until the test results are back.    Take Tylenol as needed for fever or discomfort.  Rest and keep yourself hydrated.    Follow-up with your primary care provider if your symptoms are not improving.

## 2022-01-07 NOTE — ED Triage Notes (Signed)
Pt here with cough and hoarseness with congestion x 2 days.

## 2022-01-09 LAB — COVID-19, FLU A+B AND RSV
Influenza A, NAA: NOT DETECTED
Influenza B, NAA: NOT DETECTED
RSV, NAA: NOT DETECTED
SARS-CoV-2, NAA: NOT DETECTED

## 2022-06-15 ENCOUNTER — Telehealth: Payer: Self-pay

## 2022-06-15 NOTE — Telephone Encounter (Signed)
Benefits submitted-pending Next injection due 06/23/22 or after. Lab scheduled already for 06/17/22 and OV with Dr [name] on 06/24/22

## 2022-06-16 ENCOUNTER — Telehealth: Payer: Self-pay | Admitting: Family Medicine

## 2022-06-16 DIAGNOSIS — M810 Age-related osteoporosis without current pathological fracture: Secondary | ICD-10-CM

## 2022-06-16 DIAGNOSIS — E559 Vitamin D deficiency, unspecified: Secondary | ICD-10-CM

## 2022-06-16 DIAGNOSIS — I10 Essential (primary) hypertension: Secondary | ICD-10-CM

## 2022-06-16 DIAGNOSIS — R7303 Prediabetes: Secondary | ICD-10-CM

## 2022-06-16 DIAGNOSIS — E7800 Pure hypercholesterolemia, unspecified: Secondary | ICD-10-CM

## 2022-06-16 NOTE — Telephone Encounter (Signed)
-----   Message from Ellamae Sia sent at 06/07/2022 11:17 AM EDT ----- Regarding: Lab orders for Thursday, 7.27.23 Patient is scheduled for CPX labs, please order future labs, Thanks , Karna Christmas

## 2022-06-17 ENCOUNTER — Other Ambulatory Visit: Payer: Medicare Other

## 2022-06-18 ENCOUNTER — Other Ambulatory Visit (INDEPENDENT_AMBULATORY_CARE_PROVIDER_SITE_OTHER): Payer: Medicare Other

## 2022-06-18 DIAGNOSIS — E7800 Pure hypercholesterolemia, unspecified: Secondary | ICD-10-CM

## 2022-06-18 DIAGNOSIS — R7303 Prediabetes: Secondary | ICD-10-CM | POA: Diagnosis not present

## 2022-06-18 DIAGNOSIS — E559 Vitamin D deficiency, unspecified: Secondary | ICD-10-CM | POA: Diagnosis not present

## 2022-06-18 DIAGNOSIS — I10 Essential (primary) hypertension: Secondary | ICD-10-CM

## 2022-06-18 LAB — CBC WITH DIFFERENTIAL/PLATELET
Basophils Absolute: 0 10*3/uL (ref 0.0–0.1)
Basophils Relative: 0.4 % (ref 0.0–3.0)
Eosinophils Absolute: 0.1 10*3/uL (ref 0.0–0.7)
Eosinophils Relative: 1.5 % (ref 0.0–5.0)
HCT: 39.8 % (ref 36.0–46.0)
Hemoglobin: 13.1 g/dL (ref 12.0–15.0)
Lymphocytes Relative: 29.4 % (ref 12.0–46.0)
Lymphs Abs: 1.9 10*3/uL (ref 0.7–4.0)
MCHC: 32.9 g/dL (ref 30.0–36.0)
MCV: 82.8 fl (ref 78.0–100.0)
Monocytes Absolute: 0.6 10*3/uL (ref 0.1–1.0)
Monocytes Relative: 8.8 % (ref 3.0–12.0)
Neutro Abs: 4 10*3/uL (ref 1.4–7.7)
Neutrophils Relative %: 59.9 % (ref 43.0–77.0)
Platelets: 255 10*3/uL (ref 150.0–400.0)
RBC: 4.8 Mil/uL (ref 3.87–5.11)
RDW: 14.9 % (ref 11.5–15.5)
WBC: 6.6 10*3/uL (ref 4.0–10.5)

## 2022-06-18 LAB — COMPREHENSIVE METABOLIC PANEL
ALT: 20 U/L (ref 0–35)
AST: 15 U/L (ref 0–37)
Albumin: 4.1 g/dL (ref 3.5–5.2)
Alkaline Phosphatase: 50 U/L (ref 39–117)
BUN: 12 mg/dL (ref 6–23)
CO2: 29 mEq/L (ref 19–32)
Calcium: 9.3 mg/dL (ref 8.4–10.5)
Chloride: 105 mEq/L (ref 96–112)
Creatinine, Ser: 0.91 mg/dL (ref 0.40–1.20)
GFR: 60.6 mL/min (ref >60.00)
Glucose, Bld: 128 mg/dL — ABNORMAL HIGH (ref 70–99)
Potassium: 5.1 mEq/L (ref 3.5–5.1)
Sodium: 141 mEq/L (ref 135–145)
Total Bilirubin: 0.6 mg/dL (ref 0.2–1.2)
Total Protein: 6.1 g/dL (ref 6.0–8.3)

## 2022-06-18 LAB — LIPID PANEL
Cholesterol: 207 mg/dL — ABNORMAL HIGH (ref 0–200)
HDL: 49 mg/dL (ref >39.00)
LDL Cholesterol: 134 mg/dL — ABNORMAL HIGH (ref 0–99)
NonHDL: 158.47
Total CHOL/HDL Ratio: 4
Triglycerides: 122 mg/dL (ref 0.0–149.0)
VLDL: 24.4 mg/dL (ref 0.0–40.0)

## 2022-06-18 LAB — VITAMIN D 25 HYDROXY (VIT D DEFICIENCY, FRACTURES): VITD: 16.46 ng/mL — ABNORMAL LOW (ref 30.00–100.00)

## 2022-06-18 LAB — TSH: TSH: 4.39 u[IU]/mL (ref 0.35–5.50)

## 2022-06-18 LAB — HEMOGLOBIN A1C: Hgb A1c MFr Bld: 6 % (ref 4.6–6.5)

## 2022-06-21 ENCOUNTER — Ambulatory Visit (INDEPENDENT_AMBULATORY_CARE_PROVIDER_SITE_OTHER): Payer: Medicare Other

## 2022-06-21 VITALS — Wt 139.0 lb

## 2022-06-21 DIAGNOSIS — Z780 Asymptomatic menopausal state: Secondary | ICD-10-CM

## 2022-06-21 DIAGNOSIS — Z0000 Encounter for general adult medical examination without abnormal findings: Secondary | ICD-10-CM | POA: Diagnosis not present

## 2022-06-21 NOTE — Progress Notes (Signed)
Virtual Visit via Telephone Note  I connected with  Clide Dales on 06/21/22 at  2:00 PM EDT by telephone and verified that I am speaking with the correct person using two identifiers.  Location: Patient: home Provider: [place] Persons participating in the virtual visit: [place]   I discussed the limitations, risks, security and privacy concerns of performing an evaluation and management service by telephone and the availability of in person appointments. The patient expressed understanding and agreed to proceed.  Interactive audio and video telecommunications were attempted between this nurse and patient, however failed, due to patient having technical difficulties OR patient did not have access to video capability.  We continued and completed visit with audio only.  Some vital signs may be absent or patient reported.   Dionisio David, LPN  Subjective:   NIMSI MALES is a 76 y.o. female who presents for Medicare Annual (Subsequent) preventive examination.  Review of Systems           Objective:    There were no vitals filed for this visit. There is no height or weight on file to calculate BMI.     08/04/2021   10:49 PM 06/12/2021    1:58 PM 05/03/2019    8:08 AM 04/19/2018    8:26 AM 04/19/2017    2:55 PM 03/23/2016    3:05 PM 12/30/2015    4:55 PM  Advanced Directives  Does Patient Have a Medical Advance Directive? No No No No No No No  Does patient want to make changes to medical advance directive?  No - Patient declined       Would patient like information on creating a medical advance directive? No - Patient declined  No - Patient declined No - Patient declined  Yes - Educational materials given     Current Medications (verified) Outpatient Encounter Medications as of 06/21/2022  Medication Sig   Calcium Carbonate-Vitamin D (CALCIUM-VITAMIN D) 600-200 MG-UNIT CAPS Take 2 by mouth daily    Cyanocobalamin (B-12) 2000 MCG TABS Take 1  tablet by mouth daily.   fexofenadine (ALLEGRA) 180 MG tablet Take otc as directed   fluticasone (FLONASE) 50 MCG/ACT nasal spray Place 2 sprays into both nostrils daily.   Multiple Vitamin (MULTIVITAMIN) tablet Take 1 tablet by mouth daily.     Cholecalciferol (VITAMIN D3 PO) Take 250 mcg by mouth daily. (Patient not taking: Reported on 06/21/2022)   hydrochlorothiazide (HYDRODIURIL) 12.5 MG tablet Take 12.5 mg by mouth as needed (takes PRN for swelling). (Patient not taking: Reported on 06/21/2022)   omeprazole (PRILOSEC) 20 MG capsule TAKE 1 CAPSULE BY MOUTH EVERY DAY (Patient not taking: Reported on 06/21/2022)   No facility-administered encounter medications on file as of 06/21/2022.    Allergies (verified) Pravachol and Zetia [ezetimibe]   History: Past Medical History:  Diagnosis Date   Allergic rhinitis    Breast carcinoma ([place]) 10/11   right- eventual double mastectomy in 2012   GERD (gastroesophageal reflux disease)    Hyperlipidemia    Osteoporosis    fosamax started 9/08   Shingles    Vitamin D deficiency    Past Surgical History:  Procedure Laterality Date   BREAST BIOPSY  10/11   invasive and in situ mam carcinoma   BREAST LUMPECTOMY  11/11   CATARACT EXTRACTION     does not remember when   MASTECTOMY  1/12   double- Dr. Donne Hazel   MASTECTOMY, RADICAL Bilateral 11/2010   PARTIAL HYSTERECTOMY  1980's   non cancer, ovaries intact   WRIST FRACTURE SURGERY Left    in the 89's   Family History  Problem Relation Age of Onset   Dementia Mother    Cancer Father        esophageal   Diabetes Brother    Cancer Paternal Aunt        ovarian   Social History   Socioeconomic History   Marital status: Single    Spouse name: n/a   Number of children: 1   Years of education: Not on file   Highest education level: Not on file  Occupational History   Occupation: retired    Fish farm manager: [employer]  Tobacco Use   Smoking status: Never   Smokeless tobacco:  Never  Vaping Use   Vaping Use: Never used  Substance and Sexual Activity   Alcohol use: Yes    Alcohol/week: 0.0 standard drinks of alcohol    Comment: wine rarely   Drug use: No   Sexual activity: Not Currently  Other Topics Concern   Not on file  Social History Narrative   Not on file   Social Determinants of Health   Financial Resource Strain: Low Risk  (06/12/2021)   Overall Financial Resource Strain (CARDIA)    Difficulty of Paying Living Expenses: Not hard at all  Food Insecurity: No Food Insecurity (06/12/2021)   Hunger Vital Sign    Worried About Running Out of Food in the Last Year: Never true    Ran Out of Food in the Last Year: Never true  Transportation Needs: No Transportation Needs (06/12/2021)   PRAPARE - Hydrologist (Medical): No    Lack of Transportation (Non-Medical): No  Physical Activity: Insufficiently Active (06/21/2022)   Exercise Vital Sign    Days of Exercise per Week: 3 days    Minutes of Exercise per Session: 30 min  Stress: No Stress Concern Present (06/21/2022)   [place]    Feeling of Stress : Not at all  Social Connections: Not on file    Tobacco Counseling Counseling given: Not Answered   Clinical Intake:  Pre-visit preparation completed: Yes  Pain : No/denies pain     Nutritional Risks: None Diabetes: No  How often do you need to have someone help you when you read instructions, pamphlets, or other written materials from your doctor or pharmacy?: 1 - Never  Diabetic?no  Interpreter Needed?: No  Information entered by :: Kirke Shaggy, LPN   Activities of Daily Living     No data to display          Patient Care Team: Tower, Wynelle Fanny, MD as PCP - General Marica Otter, OD as Consulting Physician (Optometry)  Indicate any recent Medical Services you may have received from other than [facility] providers in the past year (date may  be approximate).     Assessment:   This is a routine wellness examination for [place].  Hearing/Vision screen No results found.  Dietary issues and exercise activities discussed:     Goals Addressed             This Visit's Progress    DIET - EAT MORE FRUITS AND VEGETABLES         Depression Screen    06/21/2022    2:10 PM 06/12/2021    2:01 PM 06/11/2020    2:31 PM 05/03/2019    8:06 AM 04/19/2018  8:23 AM 04/19/2017    2:54 PM 03/23/2016    2:44 PM  PHQ 2/9 Scores  PHQ - 2 Score 0 0 0 0 0 0 0  PHQ- 9 Score 0 0  0 0      Fall Risk    06/12/2021    2:00 PM 06/11/2020    2:31 PM 05/03/2019    8:06 AM 04/19/2018    8:23 AM 04/19/2017    2:54 PM  Fall Risk   Falls in the past year? 0 0 1 Yes No  Comment   fell after being jumped on by dog 2 falls due to loss of balance while doing yard work   Number falls in past yr: 0  0 2 or more   Injury with Fall? 0  1 No   Risk for fall due to : Medication side effect      Follow up Falls prevention discussed;Falls evaluation completed Falls evaluation completed       FALL RISK PREVENTION PERTAINING TO THE HOME:  Any stairs in or around the home? Yes  If so, are there any without handrails? No  Home free of loose throw rugs in walkways, pet beds, electrical cords, etc? Yes  Adequate lighting in your home to reduce risk of falls? Yes   ASSISTIVE DEVICES UTILIZED TO PREVENT FALLS:  Life alert? No  Use of a cane, walker or w/c? No  Grab bars in the bathroom? No  Shower chair or bench in shower? Yes  Elevated toilet seat or a handicapped toilet? No    Cognitive Function: declined 2023      06/12/2021    2:03 PM 05/03/2019    8:10 AM 04/19/2018    8:23 AM 04/19/2017    2:57 PM 03/23/2016    3:03 PM  MMSE - Mini Mental State Exam  Orientation to time '5 5 5 5 5  '$ Orientation to Place '5 5 5 5 5  '$ Registration '3 3 3 3 3  '$ Attention/ Calculation 5 0 0 0 0  Recall '3 3 3 3 2  '$ Language- name 2 objects  0 0 0 0  Language- repeat '1  1 1 1 1  '$ Language- follow 3 step command  0 '3 3 3  '$ Language- read & follow direction  0 0 0 0  Write a sentence  0 0 0 0  Copy design  0 0 0 0  Total score  '17 20 20 19        '$ Immunizations Immunization History  Administered Date(s) Administered   Fluad Quad(high Dose 65+) 09/27/2019, 09/13/2020   Influenza Split 11/05/2011, 11/06/2012   Influenza Whole 08/22/2008, 10/13/2009, 09/04/2010   Influenza, High Dose Seasonal PF 10/18/2021   Influenza,inj,Quad PF,6+ Mos 08/29/2013, 10/06/2015, 08/13/2016   PFIZER(Purple Top)SARS-COV-2 Vaccination 01/15/2020, 02/05/2020, 09/21/2020, 06/24/2021   PPD Test 08/29/2013   Pfizer Covid-19 Vaccine Bivalent Booster 77yr & up 10/18/2021   Pneumococcal Conjugate-13 03/19/2014   Pneumococcal Polysaccharide-23 10/21/2010   Td 03/04/1999, 10/13/2009    TDAP status: Due, Education has been provided regarding the importance of this vaccine. Advised may receive this vaccine at local pharmacy or Health Dept. Aware to provide a copy of the vaccination record if obtained from local pharmacy or Health Dept. Verbalized acceptance and understanding.  Flu Vaccine status: Up to date  Pneumococcal vaccine status: Up to date  Covid-19 vaccine status: Completed vaccines  Qualifies for Shingles Vaccine? Yes   Zostavax completed No   Shingrix Completed?: No.  Education has been provided regarding the importance of this vaccine. Patient has been advised to call insurance company to determine out of pocket expense if they have not yet received this vaccine. Advised may also receive vaccine at local pharmacy or Health Dept. Verbalized acceptance and understanding.  Screening Tests Health Maintenance  Topic Date Due   Zoster Vaccines- Shingrix (1 of 2) Never done   TETANUS/TDAP  06/12/2024 (Originally 10/14/2019)   INFLUENZA VACCINE  06/22/2022   Pneumonia Vaccine 31+ Years old  Completed   DEXA SCAN  Completed   COVID-19 Vaccine  Completed   Hepatitis C  Screening  Completed   HPV VACCINES  Aged Out   COLONOSCOPY (Pts 45-27yr Insurance coverage will need to be confirmed)  Discontinued    Health Maintenance  Health Maintenance Due  Topic Date Due   Zoster Vaccines- Shingrix (1 of 2) Never done    Colorectal cancer screening: No longer required.   Mammogram status: No longer required due to age.  Bone Density status: Completed 06/20/20. Results reflect: Bone density results: OSTEOPOROSIS. Repeat every 2 years.  Lung Cancer Screening: (Low Dose CT Chest recommended if Age 78-80years, 30 pack-year currently smoking OR have quit w/in 15years.) does not qualify.   Additional Screening:  Hepatitis C Screening: does qualify; Completed 03/23/16  Vision Screening: Recommended annual ophthalmology exams for early detection of glaucoma and other disorders of the eye. Is the patient up to date with their annual eye exam?  Yes  Who is the provider or what is the name of the office in which the patient attends annual eye exams? Dr. SMarica OtterIf pt is not established with a provider, would they like to be referred to a provider to establish care? No .   Dental Screening: Recommended annual dental exams for proper oral hygiene  Community Resource Referral / Chronic Care Management: CRR required this visit?  No   CCM required this visit?  No      Plan:     I have personally reviewed and noted the following in the patient's chart:   Medical and social history Use of alcohol, tobacco or illicit drugs  Current medications and supplements including opioid prescriptions.  Functional ability and status Nutritional status Physical activity Advanced directives List of other physicians Hospitalizations, surgeries, and ER visits in previous 12 months Vitals Screenings to include cognitive, depression, and falls Referrals and appointments  In addition, I have reviewed and discussed with patient certain preventive protocols, quality  metrics, and best practice recommendations. A written personalized care plan for preventive services as well as general preventive health recommendations were provided to patient.     LDionisio David LPN   [id number]  Nurse Notes: none

## 2022-06-21 NOTE — Patient Instructions (Signed)
[name] , Thank you for taking time to come for your Medicare Wellness Visit. I appreciate your ongoing commitment to your health goals. Please review the following plan we discussed and let me know if I can assist you in the future.   Screening recommendations/referrals: Colonoscopy: aged out Mammogram: aged out Bone Density: 06/20/20 Recommended yearly ophthalmology/optometry visit for glaucoma screening and checkup Recommended yearly dental visit for hygiene and checkup  Vaccinations: Influenza vaccine: 10/18/21 Pneumococcal vaccine: 03/19/14 Tdap vaccine: 10/13/09, had one last month Shingles vaccine: Zostavax 12/21/11   Covid-19: 01/15/20, 02/05/20, 09/21/20, 06/24/21, 10/18/21  Advanced directives: no  Conditions/risks identified: none  Next appointment: Follow up in one year for your annual wellness visit 06/23/23 @ 2 pm by phone   Preventive Care 65 Years and Older, Female Preventive care refers to lifestyle choices and visits with your health care provider that can promote health and wellness. What does preventive care include? A yearly physical exam. This is also called an annual well check. Dental exams once or twice a year. Routine eye exams. Ask your health care provider how often you should have your eyes checked. Personal lifestyle choices, including: Daily care of your teeth and gums. Regular physical activity. Eating a healthy diet. Avoiding tobacco and drug use. Limiting alcohol use. Practicing safe sex. Taking low-dose aspirin every day. Taking vitamin and mineral supplements as recommended by your health care provider. What happens during an annual well check? The services and screenings done by your health care provider during your annual well check will depend on your age, overall health, lifestyle risk factors, and family history of disease. Counseling  Your health care provider may ask you questions about your: Alcohol use. Tobacco use. Drug  use. Emotional well-being. Home and relationship well-being. Sexual activity. Eating habits. History of falls. Memory and ability to understand (cognition). Work and work Statistician. Reproductive health. Screening  You may have the following tests or measurements: Height, weight, and BMI. Blood pressure. Lipid and cholesterol levels. These may be checked every 5 years, or more frequently if you are over 56 years old. Skin check. Lung cancer screening. You may have this screening every year starting at age 61 if you have a 30-pack-year history of smoking and currently smoke or have quit within the past 15 years. Fecal occult blood test (FOBT) of the stool. You may have this test every year starting at age 18. Flexible sigmoidoscopy or colonoscopy. You may have a sigmoidoscopy every 5 years or a colonoscopy every 10 years starting at age 62. Hepatitis C blood test. Hepatitis B blood test. Sexually transmitted disease (STD) testing. Diabetes screening. This is done by checking your blood sugar (glucose) after you have not eaten for a while (fasting). You may have this done every 1-3 years. Bone density scan. This is done to screen for osteoporosis. You may have this done starting at age 60. Mammogram. This may be done every 1-2 years. Talk to your health care provider about how often you should have regular mammograms. Talk with your health care provider about your test results, treatment options, and if necessary, the need for more tests. Vaccines  Your health care provider may recommend certain vaccines, such as: Influenza vaccine. This is recommended every year. Tetanus, diphtheria, and acellular pertussis (Tdap, Td) vaccine. You may need a Td booster every 10 years. Zoster vaccine. You may need this after age 74. Pneumococcal 13-valent conjugate (PCV13) vaccine. One dose is recommended after age 78. Pneumococcal polysaccharide (PPSV23) vaccine. One dose  is recommended after age  24. Talk to your health care provider about which screenings and vaccines you need and how often you need them. This information is not intended to replace advice given to you by your health care provider. Make sure you discuss any questions you have with your health care provider. Document Released: 12/05/2015 Document Revised: 07/28/2016 Document Reviewed: 09/09/2015 Elsevier Interactive Patient Education  2017 [place] Prevention in the Home Falls can cause injuries. They can happen to people of all ages. There are many things you can do to make your home safe and to help prevent falls. What can I do on the outside of my home? Regularly fix the edges of walkways and driveways and fix any cracks. Remove anything that might make you trip as you walk through a door, such as a raised step or threshold. Trim any bushes or trees on the path to your home. Use bright outdoor lighting. Clear any walking paths of anything that might make someone trip, such as rocks or tools. Regularly check to see if handrails are loose or broken. Make sure that both sides of any steps have handrails. Any raised decks and porches should have guardrails on the edges. Have any leaves, snow, or ice cleared regularly. Use sand or salt on walking paths during winter. Clean up any spills in your garage right away. This includes oil or grease spills. What can I do in the bathroom? Use night lights. Install grab bars by the toilet and in the tub and shower. Do not use towel bars as grab bars. Use non-skid mats or decals in the tub or shower. If you need to sit down in the shower, use a plastic, non-slip stool. Keep the floor dry. Clean up any water that spills on the floor as soon as it happens. Remove soap buildup in the tub or shower regularly. Attach bath mats securely with double-sided non-slip rug tape. Do not have throw rugs and other things on the floor that can make you trip. What can I do in the  bedroom? Use night lights. Make sure that you have a light by your bed that is easy to reach. Do not use any sheets or blankets that are too big for your bed. They should not hang down onto the floor. Have a firm chair that has side arms. You can use this for support while you get dressed. Do not have throw rugs and other things on the floor that can make you trip. What can I do in the kitchen? Clean up any spills right away. Avoid walking on wet floors. Keep items that you use a lot in easy-to-reach places. If you need to reach something above you, use a strong step stool that has a grab bar. Keep electrical cords out of the way. Do not use floor polish or wax that makes floors slippery. If you must use wax, use non-skid floor wax. Do not have throw rugs and other things on the floor that can make you trip. What can I do with my stairs? Do not leave any items on the stairs. Make sure that there are handrails on both sides of the stairs and use them. Fix handrails that are broken or loose. Make sure that handrails are as long as the stairways. Check any carpeting to make sure that it is firmly attached to the stairs. Fix any carpet that is loose or worn. Avoid having throw rugs at the top or bottom of the stairs.  If you do have throw rugs, attach them to the floor with carpet tape. Make sure that you have a light switch at the top of the stairs and the bottom of the stairs. If you do not have them, ask someone to add them for you. What else can I do to help prevent falls? Wear shoes that: Do not have high heels. Have rubber bottoms. Are comfortable and fit you well. Are closed at the toe. Do not wear sandals. If you use a stepladder: Make sure that it is fully opened. Do not climb a closed stepladder. Make sure that both sides of the stepladder are locked into place. Ask someone to hold it for you, if possible. Clearly mark and make sure that you can see: Any grab bars or  handrails. First and last steps. Where the edge of each step is. Use tools that help you move around (mobility aids) if they are needed. These include: Canes. Walkers. Scooters. Crutches. Turn on the lights when you go into a dark area. Replace any light bulbs as soon as they burn out. Set up your furniture so you have a clear path. Avoid moving your furniture around. If any of your floors are uneven, fix them. If there are any pets around you, be aware of where they are. Review your medicines with your doctor. Some medicines can make you feel dizzy. This can increase your chance of falling. Ask your doctor what other things that you can do to help prevent falls. This information is not intended to replace advice given to you by your health care provider. Make sure you discuss any questions you have with your health care provider. Document Released: 09/04/2009 Document Revised: 04/15/2016 Document Reviewed: 12/13/2014 Elsevier Interactive Patient Education  2017 [employer].

## 2022-06-24 ENCOUNTER — Ambulatory Visit (INDEPENDENT_AMBULATORY_CARE_PROVIDER_SITE_OTHER): Payer: Medicare Other | Admitting: Family Medicine

## 2022-06-24 ENCOUNTER — Encounter: Payer: Self-pay | Admitting: Family Medicine

## 2022-06-24 VITALS — BP 128/68 | HR 72 | Ht 61.0 in | Wt 139.2 lb

## 2022-06-24 DIAGNOSIS — E559 Vitamin D deficiency, unspecified: Secondary | ICD-10-CM | POA: Diagnosis not present

## 2022-06-24 DIAGNOSIS — I10 Essential (primary) hypertension: Secondary | ICD-10-CM

## 2022-06-24 DIAGNOSIS — Z79899 Other long term (current) drug therapy: Secondary | ICD-10-CM

## 2022-06-24 DIAGNOSIS — R7303 Prediabetes: Secondary | ICD-10-CM | POA: Diagnosis not present

## 2022-06-24 DIAGNOSIS — E2839 Other primary ovarian failure: Secondary | ICD-10-CM | POA: Diagnosis not present

## 2022-06-24 DIAGNOSIS — E7800 Pure hypercholesterolemia, unspecified: Secondary | ICD-10-CM | POA: Diagnosis not present

## 2022-06-24 DIAGNOSIS — Z853 Personal history of malignant neoplasm of breast: Secondary | ICD-10-CM | POA: Diagnosis not present

## 2022-06-24 DIAGNOSIS — M810 Age-related osteoporosis without current pathological fracture: Secondary | ICD-10-CM

## 2022-06-24 MED ORDER — OMEPRAZOLE 20 MG PO CPDR: 20.0000 mg | DELAYED_RELEASE_CAPSULE | Freq: Every day | ORAL | 0 refills | Status: DC | PRN

## 2022-06-24 NOTE — Assessment & Plan Note (Signed)
Not compliant with D Level of 16.4 Disc imp to bone and overall health inst to re start D3 at 4000 iu daily

## 2022-06-24 NOTE — Assessment & Plan Note (Signed)
bp in fair control at this time  BP Readings from Last 1 Encounters:  06/24/22 128/68   No changes needed (was taking hctz 12.5 mg daily as needed for swelling previously otherwise just lifestyle management) Most recent labs reviewed  Disc lifstyle change with low sodium diet and exercise

## 2022-06-24 NOTE — Progress Notes (Signed)
Subjective:    Patient ID: Brianna [name], female    DOB: 20-Jun-1946, 76 y.o.   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  3 weeks off right now Enjoys it   Feeling ok overall  Nothing new   Taking care of herself  Diet is so/so -loves cheese and eats too much of it     Immunization History  Administered Date(s) Administered   Fluad Quad(high Dose 65+) 09/27/2019, 09/13/2020   Influenza Split 11/05/2011, 11/06/2012   Influenza Whole 08/22/2008, 10/13/2009, 09/04/2010   Influenza, High Dose Seasonal PF 10/18/2021   Influenza,inj,Quad PF,6+ Mos 08/29/2013, 10/06/2015, 08/13/2016   PFIZER(Purple Top)SARS-COV-2 Vaccination 01/15/2020, 02/05/2020, 09/21/2020, 06/24/2021   PPD Test 08/29/2013   Pfizer Covid-19 Vaccine Bivalent Booster 37yr & up 10/18/2021   Pneumococcal Conjugate-13 03/19/2014   Pneumococcal Polysaccharide-23 10/21/2010   Td 03/04/1999, 10/13/2009   Health Maintenance Due  Topic Date Due   Zoster Vaccines- Shingrix (1 of 2) Never done   INFLUENZA VACCINE  06/22/2022   Shingrix:  interested but was not covered   Dexa 05/2020- OP  Has taken tamoxifen in the past  Now on prolia - done well with it  Falls: none Fractures: none  Supplements - not compliant  D level is low todat at 16.4-is taking less of it lately Exercise : she does some/ walking daily   H/o breast cancer with bilat mastectomy No mammograms    Colonoscopy 11/2014  Cologard negative 06/2020   HTN bp is stable today  No cp or palpitations or headaches or edema  No medicines  BP Readings from Last 3 Encounters:  06/24/22 128/68  01/07/22 (!) 143/78  08/05/21 136/64     Hctz 12.5 mg -prn for swelling  Has not needed it at all/does not have any   Eats to much salt    Lab Results  Component Value Date   CREATININE 0.91 06/18/2022   BUN 12 06/18/2022   NA 141 06/18/2022   K 5.1 06/18/2022   CL 105 06/18/2022   CO2 29 06/18/2022    GERD- no longer taking omeprazole 20 mg  Every once in a while she gets some pain in upper stomach area and then she burps and she is fine     Hyperlipidemia Lab Results  Component Value Date   CHOL 207 (H) 06/18/2022   CHOL 226 (H) 06/15/2021   CHOL 203 (H) 06/06/2020   Lab Results  Component Value Date   HDL 49.00 06/18/2022   HDL 42.90 06/15/2021   HDL 39.20 06/06/2020   Lab Results  Component Value Date   LDLCALC 134 (H) 06/18/2022   LDLCALC 152 (H) 06/15/2021   LDLCALC 137 (H) 06/06/2020   Lab Results  Component Value Date   TRIG 122.0 06/18/2022   TRIG 154.0 (H) 06/15/2021   TRIG 133.0 06/06/2020   Lab Results  Component Value Date   CHOLHDL 4 06/18/2022   CHOLHDL 5 06/15/2021   CHOLHDL 5 06/06/2020   Lab Results  Component Value Date   LDLDIRECT 154.0 05/03/2019   LDLDIRECT 119.9 10/30/2012   LDLDIRECT 136.2 11/02/2011   Eating too much cheese- wants to cut that back  Does eat a lot of vegetable  Avoids  fried foods  Some beef  Some sausage   Prediabetes Lab Results  Component Value Date   HGBA1C 6.0 06/18/2022   This is up from 5.8  Goes on a candy kick once in a while    Other labs Lab Results  Component Value Date   ALT 20 06/18/2022   AST 15 06/18/2022   ALKPHOS 50 06/18/2022   BILITOT 0.6 06/18/2022   Lab Results  Component Value Date   TSH 4.39 06/18/2022   Lab Results  Component Value Date   WBC 6.6 06/18/2022   HGB 13.1 06/18/2022   HCT 39.8 06/18/2022   MCV 82.8 06/18/2022   PLT 255.0 06/18/2022   Patient Active Problem List   Diagnosis Date Noted   Pre-employment examination 08/29/2013    Priority: Medium    Concussion 08/05/2021   Current use of proton pump inhibitor 06/14/2021   Hypertension, essential 06/05/2020   Plantar fasciitis  06/19/2018   Low back pain 06/19/2018   Routine general medical examination at a health care facility 05/10/2018   Chronic venous insufficiency 09/10/2016   Pedal edema 08/13/2016   Prediabetes 03/21/2015   Nocturnal leg cramps 03/21/2015   Estrogen deficiency 03/21/2015   History of breast cancer 12/26/2014   Encounter for Medicare annual wellness exam 02/28/2014   Vitamin D deficiency 10/11/2008   Hyperlipidemia 07/07/2007   GERD 07/07/2007   Osteoporosis 07/07/2007   Fatty liver 07/07/2007   Past Medical History:  Diagnosis Date   Allergic rhinitis    Breast carcinoma ([place]) 10/11   right- eventual double mastectomy in 2012   GERD (gastroesophageal reflux disease)    Hyperlipidemia    Osteoporosis    fosamax started 9/08   Shingles    Vitamin D deficiency    Past Surgical History:  Procedure Laterality Date   BREAST BIOPSY  10/11   invasive and in situ mam carcinoma   BREAST LUMPECTOMY  11/11   CATARACT EXTRACTION     does not remember when   MASTECTOMY  1/12   double- Dr. Donne Hazel   MASTECTOMY, RADICAL Bilateral 11/2010   PARTIAL HYSTERECTOMY  1980's   non cancer, ovaries intact   WRIST FRACTURE SURGERY Left    in the 1990's   Social History   Tobacco Use   Smoking status: Never   Smokeless tobacco: Never  Vaping Use   Vaping Use: Never used  Substance Use Topics   Alcohol use: Yes    Alcohol/week: 0.0 standard drinks of alcohol    Comment: wine rarely   Drug use: No   Family History  Problem Relation Age of Onset   Dementia Mother    Cancer Father        esophageal   Diabetes Brother    Cancer Paternal Aunt        ovarian   Allergies  Allergen Reactions   Pravachol     Myalgias  (As of visit on 05/17/2013, patient is taking Pravachol, but continues to have muscle pain)   Zetia [Ezetimibe] Other (See Comments)    Ineffective, elevated liver enzymes   Current Outpatient Medications on File Prior to Visit  Medication Sig Dispense Refill    Calcium Carbonate-Vitamin D (CALCIUM-VITAMIN D) 600-200 MG-UNIT CAPS Take 2 by mouth daily      Cholecalciferol (VITAMIN D3 PO) Take 250 mcg by mouth daily.     Cyanocobalamin (B-12) 2000 MCG TABS Take 1 tablet by mouth daily.     fexofenadine (ALLEGRA) 180 MG tablet Take otc  as directed     Multiple Vitamin (MULTIVITAMIN) tablet Take 1 tablet by mouth daily.       No current facility-administered medications on file prior to visit.     Review of Systems  Constitutional:  Negative for activity change, appetite change, fatigue, fever and unexpected weight change.  HENT:  Negative for congestion, ear pain, rhinorrhea, sinus pressure and sore throat.   Eyes:  Negative for pain, redness and visual disturbance.  Respiratory:  Negative for cough, shortness of breath and wheezing.   Cardiovascular:  Negative for chest pain and palpitations.  Gastrointestinal:  Negative for abdominal pain, blood in stool, constipation and diarrhea.  Endocrine: Negative for polydipsia and polyuria.  Genitourinary:  Negative for dysuria, frequency and urgency.  Musculoskeletal:  Positive for arthralgias. Negative for back pain and myalgias.  Skin:  Negative for pallor and rash.  Allergic/Immunologic: Negative for environmental allergies.  Neurological:  Negative for dizziness, syncope and headaches.  Hematological:  Negative for adenopathy. Does not bruise/bleed easily.  Psychiatric/Behavioral:  Negative for decreased concentration and dysphoric mood. The patient is not nervous/anxious.        Objective:   Physical Exam Constitutional:      General: She is not in acute distress.    Appearance: Normal appearance. She is well-developed and normal weight. She is not ill-appearing or diaphoretic.  HENT:     Head: Normocephalic and atraumatic.     Right Ear: Tympanic membrane, ear canal and external ear normal.     Left Ear: Tympanic membrane, ear canal and external ear normal.     Nose: Nose normal. No  congestion.     Mouth/Throat:     Mouth: Mucous membranes are moist.     Pharynx: Oropharynx is clear. No posterior oropharyngeal erythema.  Eyes:     General: No scleral icterus.    Extraocular Movements: Extraocular movements intact.     Conjunctiva/sclera: Conjunctivae normal.     Pupils: Pupils are equal, round, and reactive to light.  Neck:     Thyroid: No thyromegaly.     Vascular: No carotid bruit or JVD.  Cardiovascular:     Rate and Rhythm: Normal rate and regular rhythm.     Pulses: Normal pulses.     Heart sounds: Normal heart sounds.     No gallop.  Pulmonary:     Effort: Pulmonary effort is normal. No respiratory distress.     Breath sounds: Normal breath sounds. No wheezing.     Comments: Good air exch Chest:     Chest wall: No tenderness.  Abdominal:     General: Bowel sounds are normal. There is no distension or abdominal bruit.     Palpations: Abdomen is soft. There is no mass.     Tenderness: There is no abdominal tenderness.     Hernia: No hernia is present.  Genitourinary:    Comments: Mastectomy sites clear and non tender Some fat deposition in R axilla -no change from her baseline  Musculoskeletal:        General: No tenderness. Normal range of motion.     Cervical back: Normal range of motion and neck supple. No rigidity. No muscular tenderness.     Right lower leg: No edema.     Left lower leg: No edema.     Comments: No kyphosis   Lymphadenopathy:     Cervical: No cervical adenopathy.  Skin:    General: Skin is warm and dry.     Coloration: Skin is  not pale.     Findings: No erythema or rash.     Comments: Solar lentigines diffusely    Neurological:     Mental Status: She is alert. Mental status is at baseline.     Cranial Nerves: No cranial nerve deficit.     Motor: No abnormal muscle tone.     Coordination: Coordination normal.     Gait: Gait normal.     Deep Tendon Reflexes: Reflexes are normal and symmetric. Reflexes normal.   Psychiatric:        Mood and Affect: Mood normal.        Cognition and Memory: Cognition and memory normal.           Assessment & Plan:   Problem List Items Addressed This Visit       Cardiovascular and Mediastinum   Hypertension, essential - Primary    bp in fair control at this time  BP Readings from Last 1 Encounters:  06/24/22 128/68  No changes needed (was taking hctz 12.5 mg daily as needed for swelling previously otherwise just lifestyle management) Most recent labs reviewed  Disc lifstyle change with low sodium diet and exercise          Musculoskeletal and Integument   Osteoporosis    dexa 2021 Ordered new one  Past tamoxifen Now taking prolia and tolerates well No falls or fx Not complaint with supplements and D level is low Discussed this  Enc more wt bearing exercise         Other   Current use of proton pump inhibitor    Lab Results  Component Value Date   VITAMINB12 396 06/15/2021  Only takes omeprazole prn now       Estrogen deficiency   Relevant Orders   DG Bone Density   History of breast cancer    Doing well  Total mastectomy in the past       Hyperlipidemia    Disc goals for lipids and reasons to control them Rev last labs with pt Rev low sat fat diet in detail Improved LD at 134  Would like to get under 100 ultimately  Plans to reduce intake of beef/sausage and cheese       Prediabetes    Lab Results  Component Value Date   HGBA1C 6.0 06/18/2022  disc imp of low glycemic diet and wt loss to prevent DM2       Vitamin D deficiency    Not compliant with D Level of 16.4 Disc imp to bone and overall health inst to re start D3 at 4000 iu daily

## 2022-06-24 NOTE — Assessment & Plan Note (Addendum)
Lab Results  Component Value Date   VITAMINB12 396 06/15/2021   Only takes omeprazole prn now

## 2022-06-24 NOTE — Assessment & Plan Note (Signed)
dexa 2021 Ordered new one  Past tamoxifen Now taking prolia and tolerates well No falls or fx Not complaint with supplements and D level is low Discussed this  Enc more wt bearing exercise

## 2022-06-24 NOTE — Assessment & Plan Note (Signed)
Disc goals for lipids and reasons to control them Rev last labs with pt Rev low sat fat diet in detail Improved LD at 134  Would like to get under 100 ultimately  Plans to reduce intake of beef/sausage and cheese

## 2022-06-24 NOTE — Patient Instructions (Addendum)
If you are interested in the new shingles vaccine (Shingrix) - call your local pharmacy to check on coverage and availability  If affordable, get on a wait list at your pharmacy to get the vaccine.  Get back on vitamin D  Take 4000 iu daily   For cholesterol Avoid red meat/ fried foods/ egg yolks/ fatty breakfast meats/ butter, cheese and high fat dairy/ and shellfish   To prevent diabetes Try to get most of your carbohydrates from produce (with the exception of white potatoes)  Eat less bread/pasta/rice/snack foods/cereals/sweets and other items from the middle of the grocery store (processed carbs)  Take omeprazole as needed      Make sure to schedule your dexa (bone density) test   Please call the location of your choice from the menu below to schedule your Mammogram and/or Bone Density appointment.    [facility]                      Phone:  [phone], [place] [address]                                                             Services: Traditional and 3D Mammogram, [facility]                 Phone: [phone] [address], [place] [address]    Service: Bone Density ONLY   *this site does NOT perform mammograms  [place]                        Phone:  [phone] [address], [place] [address]                                            Services:  3D Mammogram and [place] at [facility]   Phone:  [phone]   [place]  [address], [place] [address]                                            Services: 3D Mammogram and [place]  Box at [facility])  Phone:  [phone]   [address] [place], [place] [address]                                              Services:  3D Mammogram and Bone Density

## 2022-06-24 NOTE — Assessment & Plan Note (Signed)
Lab Results  Component Value Date   HGBA1C 6.0 06/18/2022   disc imp of low glycemic diet and wt loss to prevent DM2

## 2022-06-24 NOTE — Assessment & Plan Note (Signed)
Doing well  Total mastectomy in the past

## 2022-06-25 ENCOUNTER — Ambulatory Visit (INDEPENDENT_AMBULATORY_CARE_PROVIDER_SITE_OTHER)
Admission: RE | Admit: 2022-06-25 | Discharge: 2022-06-25 | Disposition: A | Discharge status: Home / Self Care | Payer: Medicare Other | Source: Ambulatory Visit | Attending: Family Medicine | Admitting: Family Medicine

## 2022-06-25 DIAGNOSIS — E2839 Other primary ovarian failure: Secondary | ICD-10-CM

## 2022-06-30 NOTE — Telephone Encounter (Signed)
OOP cost is $0 Labs done on 06/18/22 CrCl is 50.75 mL/min. Calcium normal at 9.3 NV on 07/01/22

## 2022-07-01 ENCOUNTER — Ambulatory Visit (INDEPENDENT_AMBULATORY_CARE_PROVIDER_SITE_OTHER): Payer: Medicare Other

## 2022-07-01 DIAGNOSIS — M810 Age-related osteoporosis without current pathological fracture: Secondary | ICD-10-CM

## 2022-07-01 MED ORDER — DENOSUMAB 60 MG/ML ~~LOC~~ SOSY
60.0000 mg | PREFILLED_SYRINGE | Freq: Once | SUBCUTANEOUS | Status: AC
  Administered 2022-07-01: 60 mg via SUBCUTANEOUS

## 2022-07-01 NOTE — Progress Notes (Signed)
Per orders of Dr. Tower, injection of Prolia given by Kinnick Maus. Patient tolerated injection well.  

## 2022-07-01 NOTE — Telephone Encounter (Signed)
Pt given Prolia today.

## 2022-08-26 ENCOUNTER — Telehealth: Payer: Self-pay | Admitting: *Deleted

## 2022-08-26 NOTE — Patient Outreach (Signed)
  Care Coordination   Initial Visit Note   08/26/2022 Name: Brianna [name] MRN: [id number] DOB: 1946/03/11  Brianna [name] is a 76 y.o. year old female who sees Tower, Wynelle Fanny, MD for primary care. I spoke with  Brianna [name] by phone today.  What matters to the patients health and wellness today?  No health concerns voiced. I am doing pretty good for an old lady. RN discussed Nicholson, RN, SW, and Pharmacist. Patient declined services.     Goals Addressed             This Visit's Progress    Patient advised to follow up with AWV and vaccines            SDOH assessments and interventions completed:  Yes     Care Coordination Interventions Activated:  Yes  Care Coordination Interventions:  Yes, provided   Follow up plan: No further intervention required.   Encounter Outcome:  Pt. [place] [facility] [phone]

## 2022-09-20 ENCOUNTER — Other Ambulatory Visit: Payer: Self-pay | Admitting: Family Medicine

## 2022-09-21 DIAGNOSIS — Z23 Encounter for immunization: Secondary | ICD-10-CM | POA: Diagnosis not present

## 2022-10-05 ENCOUNTER — Ambulatory Visit (INDEPENDENT_AMBULATORY_CARE_PROVIDER_SITE_OTHER): Payer: Medicare Other | Admitting: Family Medicine

## 2022-10-05 ENCOUNTER — Encounter: Payer: Self-pay | Admitting: Family Medicine

## 2022-10-05 VITALS — BP 138/64 | HR 103 | Temp 98.1°F | Ht 61.0 in | Wt 142.8 lb

## 2022-10-05 DIAGNOSIS — R21 Rash and other nonspecific skin eruption: Secondary | ICD-10-CM

## 2022-10-05 DIAGNOSIS — T148XXA Other injury of unspecified body region, initial encounter: Secondary | ICD-10-CM | POA: Insufficient documentation

## 2022-10-05 DIAGNOSIS — R600 Localized edema: Secondary | ICD-10-CM | POA: Diagnosis not present

## 2022-10-05 DIAGNOSIS — S8011XA Contusion of right lower leg, initial encounter: Secondary | ICD-10-CM | POA: Diagnosis not present

## 2022-10-05 MED ORDER — HYDROCHLOROTHIAZIDE 12.5 MG PO CAPS
12.5000 mg | ORAL_CAPSULE | Freq: Every day | ORAL | 1 refills | Status: DC | PRN
Start: 2022-10-05 — End: 2024-06-13

## 2022-10-05 MED ORDER — TRIAMCINOLONE ACETONIDE 0.1 % EX CREA: 1.0000 | TOPICAL_CREAM | Freq: Two times a day (BID) | CUTANEOUS | 0 refills | Status: DC | PRN

## 2022-10-05 NOTE — Progress Notes (Signed)
Subjective:    Patient ID: Brianna [name], female    DOB: 1946-02-16, 76 y.o.   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)    HTN 138.64 Pulse 103   BP Readings from Last 3 Encounters:  10/05/22 138/64  06/24/22 128/68  01/07/22 (!) 143/78    Hctz 12.5 mg daily as needed for edema   Lab Results  Component Value Date   CREATININE 0.91 06/18/2022   BUN 12 06/18/2022   NA 141 06/18/2022   K 5.1 06/18/2022   CL 105 06/18/2022   CO2 29 06/18/2022   Lab Results  Component Value Date   ALT 20 06/18/2022   AST 15 06/18/2022   ALKPHOS 50 06/18/2022   BILITOT 0.6 06/18/2022    Patient Active Problem List   Diagnosis Date Noted   Pre-employment examination 08/29/2013    Priority: Medium    Hematoma 10/05/2022   Rash and nonspecific skin eruption 10/05/2022   Concussion 08/05/2021   Current use of proton pump inhibitor 06/14/2021   Hypertension, essential 06/05/2020   Plantar fasciitis 06/19/2018   Low back pain 06/19/2018   Routine general medical examination at a health care facility 05/10/2018   Chronic venous insufficiency 09/10/2016   Pedal edema 08/13/2016   Prediabetes 03/21/2015   Nocturnal leg cramps 03/21/2015   Estrogen deficiency 03/21/2015   History of breast cancer 12/26/2014   Encounter for Medicare annual wellness exam 02/28/2014    Vitamin D deficiency 10/11/2008   Hyperlipidemia 07/07/2007   GERD 07/07/2007   Osteoporosis 07/07/2007   Fatty liver 07/07/2007   Past Medical History:  Diagnosis Date   Allergic rhinitis    Breast carcinoma ([place]) 10/11   right- eventual double mastectomy in 2012   GERD (gastroesophageal reflux disease)    Hyperlipidemia    Osteoporosis    fosamax started 9/08   Shingles    Vitamin D deficiency    Past Surgical History:  Procedure Laterality Date   BREAST BIOPSY  10/11   invasive and in situ mam carcinoma   BREAST LUMPECTOMY  11/11   CATARACT EXTRACTION     does not remember when   MASTECTOMY  1/12   double- Dr. Donne Hazel   MASTECTOMY, RADICAL Bilateral 11/2010   PARTIAL HYSTERECTOMY  1980's   non cancer, ovaries intact   WRIST FRACTURE SURGERY Left    in the 1990's   Social History   Tobacco Use   Smoking status: Never   Smokeless tobacco: Never  Vaping Use   Vaping Use: Never used  Substance Use Topics   Alcohol use: Yes    Alcohol/week: 0.0 standard drinks of alcohol  Comment: wine rarely   Drug use: No   Family History  Problem Relation Age of Onset   Dementia Mother    Cancer Father        esophageal   Diabetes Brother    Cancer Paternal Aunt        ovarian   Allergies  Allergen Reactions   Pravachol     Myalgias  (As of visit on 05/17/2013, patient is taking Pravachol, but continues to have muscle pain)   Zetia [Ezetimibe] Other (See Comments)    Ineffective, elevated liver enzymes   Current Outpatient Medications on File Prior to Visit  Medication Sig Dispense Refill   Calcium Carbonate-Vitamin D (CALCIUM-VITAMIN D) 600-200 MG-UNIT CAPS Take 2 by mouth daily      Cholecalciferol (VITAMIN D3 PO) Take 250 mcg by mouth daily.     Cyanocobalamin (B-12) 2000 MCG TABS Take 1 tablet by mouth daily.     fexofenadine (ALLEGRA) 180 MG tablet Take otc as directed     Multiple Vitamin (MULTIVITAMIN) tablet Take 1 tablet by mouth daily.       omeprazole  (PRILOSEC) 20 MG capsule TAKE 1 CAPSULE BY MOUTH DAILY AS NEEDED. 90 capsule 2   No current facility-administered medications on file prior to visit.    Review of Systems  Constitutional:  Positive for fatigue. Negative for activity change, appetite change, fever and unexpected weight change.  HENT:  Negative for congestion, ear pain, rhinorrhea, sinus pressure and sore throat.   Eyes:  Negative for pain, redness and visual disturbance.  Respiratory:  Negative for cough, shortness of breath and wheezing.   Cardiovascular:  Negative for chest pain and palpitations.  Gastrointestinal:  Negative for abdominal pain, blood in stool, constipation and diarrhea.  Endocrine: Negative for polydipsia and polyuria.  Genitourinary:  Negative for dysuria, frequency and urgency.  Musculoskeletal:  Negative for arthralgias, back pain and myalgias.  Skin:  Positive for rash. Negative for pallor.  Allergic/Immunologic: Negative for environmental allergies.  Neurological:  Negative for dizziness, syncope and headaches.  Hematological:  Negative for adenopathy. Does not bruise/bleed easily.  Psychiatric/Behavioral:  Negative for decreased concentration and dysphoric mood. The patient is not nervous/anxious.        Objective:   Physical Exam Constitutional:      General: She is not in acute distress.    Appearance: Normal appearance. She is well-developed and normal weight. She is not ill-appearing or diaphoretic.  HENT:     Head: Normocephalic and atraumatic.  Eyes:     Conjunctiva/sclera: Conjunctivae normal.     Pupils: Pupils are equal, round, and reactive to light.  Neck:     Thyroid: No thyromegaly.     Vascular: No carotid bruit or JVD.  Cardiovascular:     Rate and Rhythm: Normal rate and regular rhythm.     Heart sounds: Normal heart sounds.     No gallop.  Pulmonary:     Effort: Pulmonary effort is normal. No respiratory distress.     Breath sounds: Normal breath sounds. No wheezing or  rales.  Abdominal:     General: There is no distension or abdominal bruit.     Palpations: Abdomen is soft.  Musculoskeletal:     Cervical back: Normal range of motion and neck supple. No tenderness.     Right lower leg: No edema.     Left lower leg: No edema.     Comments: Hematoma over R anterior shin,  mildly tender  Some ecchymosis also  on foot (mild)  No bony step off noted   Nl rom foot/ankle and RUE   Trace pedal edema bilaterally   Lymphadenopathy:     Cervical: No cervical adenopathy.  Skin:    General: Skin is warm and dry.     Coloration: Skin is not pale.     Findings: No rash.     Comments: Rash on back of neck/occiput and R cheek  Papular/pink No vesicles or drainage Few excoriations   Neurological:     Mental Status: She is alert.     Cranial Nerves: No cranial nerve deficit.     Coordination: Coordination normal.     Deep Tendon Reflexes: Reflexes are normal and symmetric. Reflexes normal.  Psychiatric:        Mood and Affect: Mood normal.           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

## 2022-10-05 NOTE — Assessment & Plan Note (Signed)
Uses prn hctz  (not often or long)  Watches sodium Refill sent If needed daily would consider lab work/pt is aware

## 2022-10-05 NOTE — Assessment & Plan Note (Signed)
Right shin- after injury last week Mild to moderate  No s/s of compartment syndrome  Able to bear wt and keep working   Recommend sympt care Elevate  Ice  If worse consider re eval and imaging

## 2022-10-05 NOTE — Patient Instructions (Addendum)
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

## 2022-10-05 NOTE — Assessment & Plan Note (Signed)
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

## 2022-10-08 ENCOUNTER — Telehealth: Payer: Self-pay | Admitting: Family Medicine

## 2022-10-08 DIAGNOSIS — R21 Rash and other nonspecific skin eruption: Secondary | ICD-10-CM

## 2022-10-08 NOTE — Telephone Encounter (Signed)
Patient called in and would like for Dr. [name] to put a referral in for a dermatologist. She stated she has done what you informed her to do but she is still itching like crazy. Thank you!

## 2022-10-10 NOTE — Telephone Encounter (Signed)
The referral is in  Please let us know if you don't get a call in 1-2 wk  Is the rash getting worse? Is the triamcinolone cream helping at all?

## 2022-10-10 NOTE — Addendum Note (Signed)
Addended by: Loura Pardon A on: 10/10/2022 09:29 AM   Modules accepted: Orders

## 2022-10-27 ENCOUNTER — Other Ambulatory Visit: Payer: Self-pay | Admitting: Family Medicine

## 2022-11-25 DIAGNOSIS — L2489 Irritant contact dermatitis due to other agents: Secondary | ICD-10-CM | POA: Diagnosis not present

## 2022-12-28 ENCOUNTER — Telehealth: Payer: Self-pay

## 2022-12-28 NOTE — Telephone Encounter (Signed)
Patient is due for Prolia inj on or after 01/02/2023.  Please start new PA.  Thanks, Anda Kraft

## 2022-12-31 NOTE — Telephone Encounter (Signed)
Prolia VOB initiated via [email or web address] 

## 2023-01-03 ENCOUNTER — Other Ambulatory Visit: Payer: Self-pay

## 2023-01-03 DIAGNOSIS — M810 Age-related osteoporosis without current pathological fracture: Secondary | ICD-10-CM

## 2023-01-03 NOTE — Telephone Encounter (Signed)
Spoke to patient to sch appts.  Lab appt on 01/06/23.  Need to verify labs prior to inj.  Prolia inj appt on 01/12/23.   $0 due at time of visit.

## 2023-01-03 NOTE — Telephone Encounter (Signed)
Pt ready for scheduling on or after 01/03/23  Out-of-pocket cost due at time of visit: $0  Primary: Medicare Prolia co-insurance: 0%  Admin fee co-insurance: 0%   Deductible: $88.48 met of $240 required  Secondary: [employer] Prolia co-insurance: Covered by secondary Admin fee co-insurance: Covered by secondary  Deductible: Covered by secondary  Prior Auth: Not required PA# Valid:   ** This summary of benefits is an estimation of the patient's out-of-pocket cost. Exact cost may vary based on individual plan coverage.

## 2023-01-06 ENCOUNTER — Other Ambulatory Visit (INDEPENDENT_AMBULATORY_CARE_PROVIDER_SITE_OTHER): Payer: Medicare Other

## 2023-01-06 DIAGNOSIS — M810 Age-related osteoporosis without current pathological fracture: Secondary | ICD-10-CM | POA: Diagnosis not present

## 2023-01-06 LAB — BASIC METABOLIC PANEL
BUN: 11 mg/dL (ref 6–23)
CO2: 26 mEq/L (ref 19–32)
Calcium: 9.3 mg/dL (ref 8.4–10.5)
Chloride: 106 mEq/L (ref 96–112)
Creatinine, Ser: 0.96 mg/dL (ref 0.40–1.20)
GFR: 56.62 mL/min — ABNORMAL LOW (ref >60.00)
Glucose, Bld: 138 mg/dL — ABNORMAL HIGH (ref 70–99)
Potassium: 4.4 mEq/L (ref 3.5–5.1)
Sodium: 141 mEq/L (ref 135–145)

## 2023-01-06 NOTE — Telephone Encounter (Signed)
Labs ok for Prolia inj.  Estimated Creatinine clearance 49.11 ml/min.

## 2023-01-12 ENCOUNTER — Ambulatory Visit (INDEPENDENT_AMBULATORY_CARE_PROVIDER_SITE_OTHER): Payer: Medicare Other

## 2023-01-12 DIAGNOSIS — M810 Age-related osteoporosis without current pathological fracture: Secondary | ICD-10-CM | POA: Diagnosis not present

## 2023-01-12 MED ORDER — DENOSUMAB 60 MG/ML ~~LOC~~ SOSY
60.0000 mg | PREFILLED_SYRINGE | Freq: Once | SUBCUTANEOUS | Status: AC
  Administered 2023-01-12: 60 mg via SUBCUTANEOUS

## 2023-01-12 NOTE — Progress Notes (Signed)
Per orders of Dr. [name], injection of Prolia given by Pilar Grammes in Right Arm Patient tolerated injection well.

## 2023-06-23 ENCOUNTER — Ambulatory Visit (INDEPENDENT_AMBULATORY_CARE_PROVIDER_SITE_OTHER): Payer: Medicare Other

## 2023-06-23 VITALS — Ht 60.5 in | Wt 144.0 lb

## 2023-06-23 DIAGNOSIS — Z0000 Encounter for general adult medical examination without abnormal findings: Secondary | ICD-10-CM | POA: Diagnosis not present

## 2023-06-23 NOTE — Patient Instructions (Signed)
[name] , Thank you for taking time to come for your Medicare Wellness Visit. I appreciate your ongoing commitment to your health goals. Please review the following plan we discussed and let me know if I can assist you in the future.   Referrals/Orders/Follow-Ups/Clinician Recommendations: Aim for 30 minutes of exercise or brisk walking, 6-8 glasses of water, and 5 servings of fruits and vegetables each day.   This is a list of the screening recommended for you and due dates:  Health Maintenance  Topic Date Due   Zoster (Shingles) Vaccine (1 of 2) Never done   DTaP/Tdap/Td vaccine (3 - Tdap) 10/14/2019   COVID-19 Vaccine (6 - 2023-24 season) 07/23/2022   Medicare Annual Wellness Visit  06/22/2023   Flu Shot  06/23/2023   Pneumonia Vaccine  Completed   DEXA scan (bone density measurement)  Completed   Hepatitis C Screening  Completed   HPV Vaccine  Aged Out   Colon Cancer Screening  Discontinued    Advanced directives: (Declined) Advance directive discussed with you today. Even though you declined this today, please call our office should you change your mind, and we can give you the proper paperwork for you to fill out.  Next Medicare Annual Wellness Visit scheduled for next year: Yes  Preventive Care 47 Years and Older, Female Preventive care refers to lifestyle choices and visits with your health care provider that can promote health and wellness. What does preventive care include? A yearly physical exam. This is also called an annual well check. Dental exams once or twice a year. Routine eye exams. Ask your health care provider how often you should have your eyes checked. Personal lifestyle choices, including: Daily care of your teeth and gums. Regular physical activity. Eating a healthy diet. Avoiding tobacco and drug use. Limiting alcohol use. Practicing safe sex. Taking low-dose aspirin every day. Taking vitamin and mineral supplements as recommended by your health  care provider. What happens during an annual well check? The services and screenings done by your health care provider during your annual well check will depend on your age, overall health, lifestyle risk factors, and family history of disease. Counseling  Your health care provider may ask you questions about your: Alcohol use. Tobacco use. Drug use. Emotional well-being. Home and relationship well-being. Sexual activity. Eating habits. History of falls. Memory and ability to understand (cognition). Work and work Astronomer. Reproductive health. Screening  You may have the following tests or measurements: Height, weight, and BMI. Blood pressure. Lipid and cholesterol levels. These may be checked every 5 years, or more frequently if you are over 24 years old. Skin check. Lung cancer screening. You may have this screening every year starting at age 81 if you have a 30-pack-year history of smoking and currently smoke or have quit within the past 15 years. Fecal occult blood test (FOBT) of the stool. You may have this test every year starting at age 70. Flexible sigmoidoscopy or colonoscopy. You may have a sigmoidoscopy every 5 years or a colonoscopy every 10 years starting at age 5. Hepatitis C blood test. Hepatitis B blood test. Sexually transmitted disease (STD) testing. Diabetes screening. This is done by checking your blood sugar (glucose) after you have not eaten for a while (fasting). You may have this done every 1-3 years. Bone density scan. This is done to screen for osteoporosis. You may have this done starting at age 68. Mammogram. This may be done every 1-2 years. Talk to your health care provider  about how often you should have regular mammograms. Talk with your health care provider about your test results, treatment options, and if necessary, the need for more tests. Vaccines  Your health care provider may recommend certain vaccines, such as: Influenza vaccine. This is  recommended every year. Tetanus, diphtheria, and acellular pertussis (Tdap, Td) vaccine. You may need a Td booster every 10 years. Zoster vaccine. You may need this after age 39. Pneumococcal 13-valent conjugate (PCV13) vaccine. One dose is recommended after age 65. Pneumococcal polysaccharide (PPSV23) vaccine. One dose is recommended after age 64. Talk to your health care provider about which screenings and vaccines you need and how often you need them. This information is not intended to replace advice given to you by your health care provider. Make sure you discuss any questions you have with your health care provider. Document Released: 12/05/2015 Document Revised: 07/28/2016 Document Reviewed: 09/09/2015 Elsevier Interactive Patient Education  2017 [employer].  Fall Prevention in the Home Falls can cause injuries. They can happen to people of all ages. There are many things you can do to make your home safe and to help prevent falls. What can I do on the outside of my home? Regularly fix the edges of walkways and driveways and fix any cracks. Remove anything that might make you trip as you walk through a door, such as a raised step or threshold. Trim any bushes or trees on the path to your home. Use bright outdoor lighting. Clear any walking paths of anything that might make someone trip, such as rocks or tools. Regularly check to see if handrails are loose or broken. Make sure that both sides of any steps have handrails. Any raised decks and porches should have guardrails on the edges. Have any leaves, snow, or ice cleared regularly. Use sand or salt on walking paths during winter. Clean up any spills in your garage right away. This includes oil or grease spills. What can I do in the bathroom? Use night lights. Install grab bars by the toilet and in the tub and shower. Do not use towel bars as grab bars. Use non-skid mats or decals in the tub or shower. If you need to sit down in  the shower, use a plastic, non-slip stool. Keep the floor dry. Clean up any water that spills on the floor as soon as it happens. Remove soap buildup in the tub or shower regularly. Attach bath mats securely with double-sided non-slip rug tape. Do not have throw rugs and other things on the floor that can make you trip. What can I do in the bedroom? Use night lights. Make sure that you have a light by your bed that is easy to reach. Do not use any sheets or blankets that are too big for your bed. They should not hang down onto the floor. Have a firm chair that has side arms. You can use this for support while you get dressed. Do not have throw rugs and other things on the floor that can make you trip. What can I do in the kitchen? Clean up any spills right away. Avoid walking on wet floors. Keep items that you use a lot in easy-to-reach places. If you need to reach something above you, use a strong step stool that has a grab bar. Keep electrical cords out of the way. Do not use floor polish or wax that makes floors slippery. If you must use wax, use non-skid floor wax. Do not have throw rugs and  other things on the floor that can make you trip. What can I do with my stairs? Do not leave any items on the stairs. Make sure that there are handrails on both sides of the stairs and use them. Fix handrails that are broken or loose. Make sure that handrails are as long as the stairways. Check any carpeting to make sure that it is firmly attached to the stairs. Fix any carpet that is loose or worn. Avoid having throw rugs at the top or bottom of the stairs. If you do have throw rugs, attach them to the floor with carpet tape. Make sure that you have a light switch at the top of the stairs and the bottom of the stairs. If you do not have them, ask someone to add them for you. What else can I do to help prevent falls? Wear shoes that: Do not have high heels. Have rubber bottoms. Are comfortable  and fit you well. Are closed at the toe. Do not wear sandals. If you use a stepladder: Make sure that it is fully opened. Do not climb a closed stepladder. Make sure that both sides of the stepladder are locked into place. Ask someone to hold it for you, if possible. Clearly mark and make sure that you can see: Any grab bars or handrails. First and last steps. Where the edge of each step is. Use tools that help you move around (mobility aids) if they are needed. These include: Canes. Walkers. Scooters. Crutches. Turn on the lights when you go into a dark area. Replace any light bulbs as soon as they burn out. Set up your furniture so you have a clear path. Avoid moving your furniture around. If any of your floors are uneven, fix them. If there are any pets around you, be aware of where they are. Review your medicines with your doctor. Some medicines can make you feel dizzy. This can increase your chance of falling. Ask your doctor what other things that you can do to help prevent falls. This information is not intended to replace advice given to you by your health care provider. Make sure you discuss any questions you have with your health care provider. Document Released: 09/04/2009 Document Revised: 04/15/2016 Document Reviewed: 12/13/2014 Elsevier Interactive Patient Education  2017 [employer].

## 2023-06-23 NOTE — Progress Notes (Signed)
Subjective:   Brianna [name] is a 77 y.o. female who presents for Medicare Annual (Subsequent) preventive examination.  Visit Complete: Virtual  I connected with  Christy Gentles on 06/23/23 by a audio enabled telemedicine application and verified that I am speaking with the correct person using two identifiers.  Patient Location: Home  Provider Location: Home Office  I discussed the limitations of evaluation and management by telemedicine. The patient expressed understanding and agreed to proceed.  Patient Medicare AWV questionnaire was completed by the patient on 06/23/23; I have confirmed that all information answered by patient is correct and no changes since this date.  Vital Signs: Unable to obtain new vitals due to this being a telehealth visit.   Review of Systems     Cardiac Risk Factors include: advanced age (>70men, >81 women);hypertension;dyslipidemia;sedentary lifestyle     Objective:    Today's Vitals   06/23/23 0813 06/23/23 1356  Weight:  144 lb (65.3 kg)  Height:  5' 0.5" (1.537 m)  PainSc: 5     Body mass index is 27.66 kg/m.     06/23/2023    2:10 PM 06/21/2022    2:13 PM 08/04/2021   10:49 PM 06/12/2021    1:58 PM 05/03/2019    8:08 AM 04/19/2018    8:26 AM 04/19/2017    2:55 PM  Advanced Directives  Does Patient Have a Medical Advance Directive? No No No No No No No  Does patient want to make changes to medical advance directive?    No - Patient declined     Would patient like information on creating a medical advance directive? No - Patient declined No - Patient declined No - Patient declined  No - Patient declined No - Patient declined     Current Medications (verified) Outpatient Encounter Medications as of 06/23/2023  Medication Sig   Calcium Carbonate-Vitamin D (CALCIUM-VITAMIN D) 600-200 MG-UNIT CAPS Take 2 by mouth daily    Cholecalciferol (VITAMIN D-3) 125 MCG (5000 UT) TABS Take by mouth.   Cyanocobalamin (VITAMIN B-12) 5000 MCG TBDP Take  by mouth.   fexofenadine (ALLEGRA) 180 MG tablet Take otc as directed   hydrochlorothiazide (MICROZIDE) 12.5 MG capsule Take 1 capsule (12.5 mg total) by mouth daily as needed (leg swelling).   Multiple Vitamin (MULTIVITAMIN) tablet Take 1 tablet by mouth daily.     omeprazole (PRILOSEC) 20 MG capsule TAKE 1 CAPSULE BY MOUTH DAILY AS NEEDED.   Cholecalciferol (VITAMIN D3 PO) Take 250 mcg by mouth daily. (Patient not taking: Reported on 06/23/2023)   Cyanocobalamin (B-12) 2000 MCG TABS Take 1 tablet by mouth daily. (Patient not taking: Reported on 06/23/2023)   triamcinolone cream (KENALOG) 0.1 % Apply 1 Application topically 2 (two) times daily as needed (to affected rash area).   No facility-administered encounter medications on file as of 06/23/2023.    Allergies (verified) Pravachol and Zetia [ezetimibe]   History: Past Medical History:  Diagnosis Date   Allergic rhinitis    Breast carcinoma (HCC) 10/11   right- eventual double mastectomy in 2012   GERD (gastroesophageal reflux disease)    Hyperlipidemia    Osteoporosis    fosamax started 9/08   Shingles    Vitamin D deficiency    Past Surgical History:  Procedure Laterality Date   BREAST BIOPSY  10/11   invasive and in situ mam carcinoma   BREAST LUMPECTOMY  11/11   CATARACT EXTRACTION     does not remember when   MASTECTOMY  1/12   double- Dr. Dwain Sarna   MASTECTOMY, RADICAL Bilateral 11/2010   PARTIAL HYSTERECTOMY  1980's   non cancer, ovaries intact   WRIST FRACTURE SURGERY Left    in the 1990's   Family History  Problem Relation Age of Onset   Dementia Mother    Cancer Father        esophageal   Diabetes Brother    Cancer Paternal Aunt        ovarian   Social History   Socioeconomic History   Marital status: Divorced    Spouse name: n/a   Number of children: 1   Years of education: Not on file   Highest education level: Not on file  Occupational History   Occupation: retired    Associate Professor: [employer]  Tobacco Use   Smoking status: Never   Smokeless tobacco: Never  Vaping Use   Vaping status: Never Used  Substance and Sexual Activity   Alcohol use: Yes    Alcohol/week: 0.0 standard drinks of alcohol    Comment: wine rarely   Drug use: No   Sexual activity: Not Currently  Other Topics Concern   Not on file  Social History Narrative   Not on file   Social Determinants of Health   Financial Resource Strain: Low Risk  (06/23/2023)   Overall Financial Resource Strain (CARDIA)    Difficulty of Paying Living Expenses: Not hard at all  Food Insecurity: No Food Insecurity (06/23/2023)   Hunger Vital Sign    Worried About Running Out of Food in the Last Year: Never true    Ran Out of Food in the Last Year: Never true  Transportation Needs: No Transportation Needs (06/23/2023)   PRAPARE - Administrator, Civil Service (Medical): No    Lack of Transportation (Non-Medical): No  Physical Activity: Inactive (06/23/2023)   Exercise Vital Sign    Days of Exercise per Week: 0 days    Minutes of Exercise per Session: 0 min  Stress: No Stress Concern Present (06/23/2023)   [employer] of Occupational Health - Occupational Stress Questionnaire    Feeling of Stress : Not at all  Social Connections: Moderately Integrated (06/23/2023)   Social Connection and Isolation Panel [NHANES]    Frequency of Communication with Friends and Family: More than three times a week    Frequency of Social Gatherings with Friends and Family: More than three times a week    Attends Religious Services: More than 4 times per year    Active Member of [employer] or Organizations: Yes    Attends Engineer, structural: More than 4 times per year    Marital Status: Divorced    Tobacco Counseling Counseling given: Not Answered   Clinical Intake:  Pre-visit preparation completed: Yes  Pain : 0-10 Pain Score: 5  Pain Location: Back Pain Orientation: Lower Pain Descriptors / Indicators: Aching,  Dull Pain Onset: More than a month ago     BMI - recorded: 27.66 Nutritional Status: BMI 25 -29 Overweight Nutritional Risks: None Diabetes: No  How often do you need to have someone help you when you read instructions, pamphlets, or other written materials from your doctor or pharmacy?: 1 - Never  Interpreter Needed?: No  Information entered by :: C.Jonnette Nuon LPN   Activities of Daily Living    06/23/2023    8:13 AM  In your present state of health, do you have any difficulty performing the following activities:  Hearing? 0  Vision? 0  Difficulty concentrating or making decisions? 0  Walking or climbing stairs? 0  Dressing or bathing? 0  Doing errands, shopping? 0  Preparing Food and eating ? N  Using the Toilet? N  In the past six months, have you accidently leaked urine? Y  Comment wears pad  Do you have problems with loss of bowel control? N  Managing your Medications? N  Managing your Finances? N  Housekeeping or managing your Housekeeping? N    Patient Care Team: Tower, Audrie Gallus, MD as PCP - General Blima Ledger, OD as Consulting Physician (Optometry)  Indicate any recent Medical Services you may have received from other than [facility] providers in the past year (date may be approximate).     Assessment:   This is a routine wellness examination for [place].  Hearing/Vision screen Hearing Screening - Comments:: Has decreased hearing Vision Screening - Comments:: Readers - Dr.S.Miller - UTD on eye exams  Dietary issues and exercise activities discussed:     Goals Addressed             This Visit's Progress    Patient Stated       Live to be 77 years old.       Depression Screen    06/23/2023    2:09 PM 06/24/2022    8:39 AM 06/21/2022    2:10 PM 06/12/2021    2:01 PM 06/11/2020    2:31 PM 05/03/2019    8:06 AM 04/19/2018    8:23 AM  PHQ 2/9 Scores  PHQ - 2 Score 0 0 0 0 0 0 0  PHQ- 9 Score  0 0 0  0 0    Fall Risk    06/23/2023    8:13 AM 06/24/2022     8:39 AM 06/21/2022    2:14 PM 06/12/2021    2:00 PM 06/11/2020    2:31 PM  Fall Risk   Falls in the past year? 0 0 0 0 0  Number falls in past yr: 0  0 0   Injury with Fall? 0  0 0   Risk for fall due to : No Fall Risks  No Fall Risks Medication side effect   Follow up Falls evaluation completed;Follow up appointment Falls evaluation completed Falls evaluation completed Falls prevention discussed;Falls evaluation completed Falls evaluation completed    MEDICARE RISK AT HOME:   TIMED UP AND GO:  Was the test performed?  No    Cognitive Function:    06/12/2021    2:03 PM 05/03/2019    8:10 AM 04/19/2018    8:23 AM 04/19/2017    2:57 PM 03/23/2016    3:03 PM  MMSE - Mini Mental State Exam  Orientation to time 5 5 5 5 5   Orientation to Place 5 5 5 5 5   Registration 3 3 3 3 3   Attention/ Calculation 5 0 0 0 0  Recall 3 3 3 3 2   Language- name 2 objects  0 0 0 0  Language- repeat 1 1 1 1 1   Language- follow 3 step command  0 3 3 3   Language- read & follow direction  0 0 0 0  Write a sentence  0 0 0 0  Copy design  0 0 0 0  Total score  17 20 20 19         06/23/2023    2:13 PM  6CIT Screen  What Year? 0 points  What month? 0 points  What time?  0 points  Count back from 20 0 points  Months in reverse 0 points  Repeat phrase 0 points  Total Score 0 points    Immunizations Immunization History  Administered Date(s) Administered   Fluad Quad(high Dose 65+) 09/27/2019, 09/13/2020   Influenza Split 11/05/2011, 11/06/2012   Influenza Whole 08/22/2008, 10/13/2009, 09/04/2010   Influenza, High Dose Seasonal PF 10/18/2021   Influenza,inj,Quad PF,6+ Mos 08/29/2013, 10/06/2015, 08/13/2016   PFIZER(Purple Top)SARS-COV-2 Vaccination 01/15/2020, 02/05/2020, 09/21/2020, 06/24/2021   PPD Test 08/29/2013   Pfizer Covid-19 Vaccine Bivalent Booster 58yrs & up 10/18/2021   Pneumococcal Conjugate-13 03/19/2014   Pneumococcal Polysaccharide-23 10/21/2010   Td 03/04/1999, 10/13/2009     TDAP status: Due, Education has been provided regarding the importance of this vaccine. Advised may receive this vaccine at local pharmacy or Health Dept. Aware to provide a copy of the vaccination record if obtained from local pharmacy or Health Dept. Verbalized acceptance and understanding.  Flu Vaccine status: Due, Education has been provided regarding the importance of this vaccine. Advised may receive this vaccine at local pharmacy or Health Dept. Aware to provide a copy of the vaccination record if obtained from local pharmacy or Health Dept. Verbalized acceptance and understanding.  Pneumococcal vaccine status: Up to date  Covid-19 vaccine status: Information provided on how to obtain vaccines.   Qualifies for Shingles Vaccine? Yes   Zostavax completed Yes   Shingrix Completed?: No.    Education has been provided regarding the importance of this vaccine. Patient has been advised to call insurance company to determine out of pocket expense if they have not yet received this vaccine. Advised may also receive vaccine at local pharmacy or Health Dept. Verbalized acceptance and understanding.  Screening Tests Health Maintenance  Topic Date Due   Zoster Vaccines- Shingrix (1 of 2) Never done   DTaP/Tdap/Td (3 - Tdap) 10/14/2019   COVID-19 Vaccine (6 - 2023-24 season) 07/23/2022   INFLUENZA VACCINE  06/23/2023   Medicare Annual Wellness (AWV)  06/22/2024   Pneumonia Vaccine 57+ Years old  Completed   DEXA SCAN  Completed   Hepatitis C Screening  Completed   HPV VACCINES  Aged Out   Colonoscopy  Discontinued    Health Maintenance  Health Maintenance Due  Topic Date Due   Zoster Vaccines- Shingrix (1 of 2) Never done   DTaP/Tdap/Td (3 - Tdap) 10/14/2019   COVID-19 Vaccine (6 - 2023-24 season) 07/23/2022   INFLUENZA VACCINE  06/23/2023    Colorectal cancer screening: No longer required.   Mammogram status: No longer required due to age.   Lung Cancer Screening: (Low Dose  CT Chest recommended if Age 46-80 years, 20 pack-year currently smoking OR have quit w/in 15years.) does not qualify.   Lung Cancer Screening Referral: no  Additional Screening:  Hepatitis C Screening: does qualify; Completed 03/23/16  Vision Screening: Recommended annual ophthalmology exams for early detection of glaucoma and other disorders of the eye. Is the patient up to date with their annual eye exam?  Yes  Who is the provider or what is the name of the office in which the patient attends annual eye exams? Dr.S.Miller If pt is not established with a provider, would they like to be referred to a provider to establish care? Yes .   Dental Screening: Recommended annual dental exams for proper oral hygiene    Community Resource Referral / Chronic Care Management: CRR required this visit?  No   CCM required this visit?  No  Plan:     I have personally reviewed and noted the following in the patient's chart:   Medical and social history Use of alcohol, tobacco or illicit drugs  Current medications and supplements including opioid prescriptions. Patient is not currently taking opioid prescriptions. Functional ability and status Nutritional status Physical activity Advanced directives List of other physicians Hospitalizations, surgeries, and ER visits in previous 12 months Vitals Screenings to include cognitive, depression, and falls Referrals and appointments  In addition, I have reviewed and discussed with patient certain preventive protocols, quality metrics, and best practice recommendations. A written personalized care plan for preventive services as well as general preventive health recommendations were provided to patient.     Maryan Puls, LPN   03/25/980   After Visit Summary: (MyChart) Due to this being a telephonic visit, the after visit summary with patients personalized plan was offered to patient via MyChart   Nurse Notes: none

## 2023-06-28 ENCOUNTER — Other Ambulatory Visit (INDEPENDENT_AMBULATORY_CARE_PROVIDER_SITE_OTHER): Payer: Medicare Other

## 2023-06-28 DIAGNOSIS — R7303 Prediabetes: Secondary | ICD-10-CM

## 2023-06-28 DIAGNOSIS — M810 Age-related osteoporosis without current pathological fracture: Secondary | ICD-10-CM | POA: Diagnosis not present

## 2023-06-28 DIAGNOSIS — I10 Essential (primary) hypertension: Secondary | ICD-10-CM | POA: Diagnosis not present

## 2023-06-28 DIAGNOSIS — Z0000 Encounter for general adult medical examination without abnormal findings: Secondary | ICD-10-CM | POA: Diagnosis not present

## 2023-06-28 DIAGNOSIS — E7800 Pure hypercholesterolemia, unspecified: Secondary | ICD-10-CM

## 2023-06-28 LAB — LIPID PANEL
Cholesterol: 245 mg/dL — ABNORMAL HIGH (ref 0–200)
HDL: 47.4 mg/dL (ref >39.00)
LDL Cholesterol: 171 mg/dL — ABNORMAL HIGH (ref 0–99)
NonHDL: 197.55
Total CHOL/HDL Ratio: 5
Triglycerides: 132 mg/dL (ref 0.0–149.0)
VLDL: 26.4 mg/dL (ref 0.0–40.0)

## 2023-06-28 LAB — CBC WITH DIFFERENTIAL/PLATELET
Basophils Absolute: 0 10*3/uL (ref 0.0–0.1)
Basophils Relative: 0.5 % (ref 0.0–3.0)
Eosinophils Absolute: 0.1 10*3/uL (ref 0.0–0.7)
Eosinophils Relative: 1.7 % (ref 0.0–5.0)
HCT: 39.8 % (ref 36.0–46.0)
Hemoglobin: 12.7 g/dL (ref 12.0–15.0)
Lymphocytes Relative: 26.6 % (ref 12.0–46.0)
Lymphs Abs: 2 10*3/uL (ref 0.7–4.0)
MCHC: 32 g/dL (ref 30.0–36.0)
MCV: 84.6 fl (ref 78.0–100.0)
Monocytes Absolute: 0.7 10*3/uL (ref 0.1–1.0)
Monocytes Relative: 9.5 % (ref 3.0–12.0)
Neutro Abs: 4.6 10*3/uL (ref 1.4–7.7)
Neutrophils Relative %: 61.7 % (ref 43.0–77.0)
Platelets: 311 10*3/uL (ref 150.0–400.0)
RBC: 4.7 Mil/uL (ref 3.87–5.11)
RDW: 13.6 % (ref 11.5–15.5)
WBC: 7.5 10*3/uL (ref 4.0–10.5)

## 2023-06-28 LAB — COMPREHENSIVE METABOLIC PANEL
ALT: 26 U/L (ref 0–35)
AST: 15 U/L (ref 0–37)
Albumin: 4 g/dL (ref 3.5–5.2)
Alkaline Phosphatase: 52 U/L (ref 39–117)
BUN: 16 mg/dL (ref 6–23)
CO2: 29 mEq/L (ref 19–32)
Calcium: 9.4 mg/dL (ref 8.4–10.5)
Chloride: 102 mEq/L (ref 96–112)
Creatinine, Ser: 0.99 mg/dL (ref 0.40–1.20)
GFR: 54.38 mL/min — ABNORMAL LOW (ref >60.00)
Glucose, Bld: 147 mg/dL — ABNORMAL HIGH (ref 70–99)
Potassium: 4.7 mEq/L (ref 3.5–5.1)
Sodium: 140 mEq/L (ref 135–145)
Total Bilirubin: 0.5 mg/dL (ref 0.2–1.2)
Total Protein: 6.1 g/dL (ref 6.0–8.3)

## 2023-06-28 LAB — HEMOGLOBIN A1C: Hgb A1c MFr Bld: 6.2 % (ref 4.6–6.5)

## 2023-06-28 LAB — TSH: TSH: 5.11 u[IU]/mL (ref 0.35–5.50)

## 2023-06-28 LAB — VITAMIN D 25 HYDROXY (VIT D DEFICIENCY, FRACTURES): VITD: 46.96 ng/mL (ref 30.00–100.00)

## 2023-06-28 NOTE — Addendum Note (Signed)
Addended by: Alvina Chou on: 06/28/2023 08:13 AM   Modules accepted: Orders

## 2023-07-05 ENCOUNTER — Ambulatory Visit: Payer: Medicare Other | Admitting: Family Medicine

## 2023-07-05 ENCOUNTER — Encounter: Payer: Self-pay | Admitting: Family Medicine

## 2023-07-05 VITALS — BP 130/70 | HR 86 | Temp 97.9°F | Ht 60.5 in | Wt 144.5 lb

## 2023-07-05 DIAGNOSIS — K760 Fatty (change of) liver, not elsewhere classified: Secondary | ICD-10-CM

## 2023-07-05 DIAGNOSIS — E559 Vitamin D deficiency, unspecified: Secondary | ICD-10-CM | POA: Diagnosis not present

## 2023-07-05 DIAGNOSIS — I10 Essential (primary) hypertension: Secondary | ICD-10-CM

## 2023-07-05 DIAGNOSIS — Z0000 Encounter for general adult medical examination without abnormal findings: Secondary | ICD-10-CM

## 2023-07-05 DIAGNOSIS — R7303 Prediabetes: Secondary | ICD-10-CM

## 2023-07-05 DIAGNOSIS — E7800 Pure hypercholesterolemia, unspecified: Secondary | ICD-10-CM | POA: Diagnosis not present

## 2023-07-05 DIAGNOSIS — K219 Gastro-esophageal reflux disease without esophagitis: Secondary | ICD-10-CM | POA: Diagnosis not present

## 2023-07-05 DIAGNOSIS — Z1211 Encounter for screening for malignant neoplasm of colon: Secondary | ICD-10-CM | POA: Diagnosis not present

## 2023-07-05 DIAGNOSIS — M810 Age-related osteoporosis without current pathological fracture: Secondary | ICD-10-CM | POA: Diagnosis not present

## 2023-07-05 DIAGNOSIS — Z79899 Other long term (current) drug therapy: Secondary | ICD-10-CM | POA: Diagnosis not present

## 2023-07-05 MED ORDER — OMEPRAZOLE 20 MG PO CPDR: 20.0000 mg | DELAYED_RELEASE_CAPSULE | Freq: Every day | ORAL | 2 refills | Status: DC | PRN

## 2023-07-05 NOTE — Assessment & Plan Note (Signed)
Taking omeprazole more days than not  Plan to watch B12 and D levels Does supplement  Also encouraged fluid intake

## 2023-07-05 NOTE — Assessment & Plan Note (Signed)
Will continue to watch D and B12 Does supplement   Encouraged good fluids for renal health

## 2023-07-05 NOTE — Assessment & Plan Note (Signed)
Vitamin D level is therapeutic with current supplementation Disc importance of this to bone and overall health Last vitamin D Lab Results  Component Value Date   VD25OH 46.96 06/28/2023

## 2023-07-05 NOTE — Assessment & Plan Note (Signed)
Disc goals for lipids and reasons to control them Rev last labs with pt Rev low sat fat diet in detail LDL up significantly  Intol of pravastatin and zetia in past  Plans to change diet  Re check 3 mo  ? If open to diff statin in future if needed

## 2023-07-05 NOTE — Assessment & Plan Note (Signed)
Cologuard ordered (neg 3 y ago)

## 2023-07-05 NOTE — Patient Instructions (Addendum)
If you are interested in the new shingles vaccine (Shingrix) - call your local pharmacy to check on coverage and availability  If affordable, get on a wait list at your pharmacy to get the vaccine.  I ordered the cologuard test  If you don't hear from the company in 1-2 weeks let us know    Stay active Add some strength training to your routine, this is important for bone and brain health and can reduce your risk of falls and help your body use insulin properly and regulate weight  Light weights, exercise bands , and internet videos are a good way to start  Yoga (chair or regular), machines , floor exercises or a gym with machines are also good options     For cholesterol Avoid red meat/ fried foods/ egg yolks/ fatty breakfast meats/ butter, cheese and high fat dairy/ and shellfish   Try to get most of your carbohydrates from produce (with the exception of white potatoes) and whole grains Eat less bread/pasta/rice/snack foods/cereals/sweets and other items from the middle of the grocery store (processed carbs)    I want to re check cholesterol and A1c in 3 months       Mediterranean Diet  Why follow it? Research shows. Those who follow the Mediterranean diet have a reduced risk of heart disease  The diet is associated with a reduced incidence of Parkinson's and Alzheimer's diseases People following the diet may have longer life expectancies and lower rates of chronic diseases  The Dietary Guidelines for Americans recommends the Mediterranean diet as an eating plan to promote health and prevent disease  What Is the Mediterranean Diet?  Healthy eating plan based on typical foods and recipes of Mediterranean-style cooking The diet is primarily a plant based diet; these foods should make up a majority of meals   Starches - Plant based foods should make up a majority of meals - They are an important sources of vitamins, minerals, energy, antioxidants, and fiber - Choose whole grains,  foods high in fiber and minimally processed items  - Typical grain sources include wheat, oats, barley, corn, brown rice, bulgar, farro, millet, polenta, couscous  - Various types of beans include chickpeas, lentils, fava beans, black beans, white beans   Fruits  Veggies - Large quantities of antioxidant rich fruits & veggies; 6 or more servings  - Vegetables can be eaten raw or lightly drizzled with oil and cooked  - Vegetables common to the traditional Mediterranean Diet include: artichokes, arugula, beets, broccoli, brussel sprouts, cabbage, carrots, celery, collard greens, cucumbers, eggplant, kale, leeks, lemons, lettuce, mushrooms, okra, onions, peas, peppers, potatoes, pumpkin, radishes, rutabaga, shallots, spinach, sweet potatoes, turnips, zucchini - Fruits common to the Mediterranean Diet include: apples, apricots, avocados, cherries, clementines, dates, figs, grapefruits, grapes, melons, nectarines, oranges, peaches, pears, pomegranates, strawberries, tangerines  Fats - Replace butter and margarine with healthy oils, such as olive oil, canola oil, and tahini  - Limit nuts to no more than a handful a day  - Nuts include walnuts, almonds, pecans, pistachios, pine nuts  - Limit or avoid candied, honey roasted or heavily salted nuts - Olives are central to the [employer] - can be eaten whole or used in a variety of dishes   Meats Protein - Limiting red meat: no more than a few times a month - When eating red meat: choose lean cuts and keep the portion to the size of deck of cards - Eggs: approx. 0 to 4 times a week  -  Fish and lean poultry: at least 2 a week  - Healthy protein sources include, chicken, [place], lean beef, lamb - Increase intake of seafood such as tuna, salmon, trout, mackerel, shrimp, scallops - Avoid or limit high fat processed meats such as sausage and bacon  Dairy - Include moderate amounts of low fat dairy products  - Focus on healthy dairy such as fat free  yogurt, skim milk, low or reduced fat cheese - Limit dairy products higher in fat such as whole or 2% milk, cheese, ice cream  Alcohol - Moderate amounts of red wine is ok  - No more than 5 oz daily for women (all ages) and men older than age 32  - No more than 10 oz of wine daily for men younger than 60  Other - Limit sweets and other desserts  - Use herbs and spices instead of salt to flavor foods  - Herbs and spices common to the traditional Mediterranean Diet include: basil, bay leaves, chives, cloves, cumin, fennel, garlic, lavender, marjoram, mint, oregano, parsley, pepper, rosemary, sage, savory, sumac, tarragon, thyme   It's not just a diet, it's a lifestyle:  The Mediterranean diet includes lifestyle factors typical of those in the region  Foods, drinks and meals are best eaten with others and savored Daily physical activity is important for overall good health This could be strenuous exercise like running and aerobics This could also be more leisurely activities such as walking, housework, yard-work, or taking the stairs Moderation is the key; a balanced and healthy diet accommodates most foods and drinks Consider portion sizes and frequency of consumption of certain foods   Meal Ideas & Options:  Breakfast:  Whole wheat toast or whole wheat English muffins with peanut butter & hard boiled egg Steel cut oats topped with apples & cinnamon and skim milk  Fresh fruit: banana, strawberries, melon, berries, peaches  Smoothies: strawberries, bananas, greek yogurt, peanut butter Low fat greek yogurt with blueberries and granola  Egg white omelet with spinach and mushrooms Breakfast couscous: whole wheat couscous, apricots, skim milk, cranberries  Sandwiches:  Hummus and grilled vegetables (peppers, zucchini, squash) on whole wheat bread   Grilled chicken on whole wheat pita with lettuce, tomatoes, cucumbers or tzatziki  [place] salad on whole wheat bread: tuna salad made with greek  yogurt, olives, red peppers, capers, green onions Garlic rosemary lamb pita: lamb sauted with garlic, rosemary, salt & pepper; add lettuce, cucumber, greek yogurt to pita - flavor with lemon juice and black pepper  Seafood:  Mediterranean grilled salmon, seasoned with garlic, basil, parsley, lemon juice and black pepper Shrimp, lemon, and spinach whole-grain pasta salad made with low fat greek yogurt  Seared scallops with lemon orzo  Seared tuna steaks seasoned salt, pepper, coriander topped with tomato mixture of olives, tomatoes, olive oil, minced garlic, parsley, green onions and cappers  Meats:  Herbed greek chicken salad with kalamata olives, cucumber, feta  Red bell peppers stuffed with spinach, bulgur, lean ground beef (or lentils) & topped with feta   Kebabs: skewers of chicken, tomatoes, onions, zucchini, squash  [place] burgers: made with red onions, mint, dill, lemon juice, feta cheese topped with roasted red peppers Vegetarian Cucumber salad: cucumbers, artichoke hearts, celery, red onion, feta cheese, tossed in olive oil & lemon juice  Hummus and whole grain pita points with a greek salad (lettuce, tomato, feta, olives, cucumbers, red onion) Lentil soup with celery, carrots made with vegetable broth, garlic, salt and pepper  Tabouli salad: parsley,  bulgur, mint, scallions, cucumbers, tomato, radishes, lemon juice, olive oil, salt and pepper.

## 2023-07-05 NOTE — Assessment & Plan Note (Signed)
bp in fair control at this time  BP Readings from Last 1 Encounters:  07/05/23 130/70   No changes needed (was taking hctz 12.5 mg daily as needed for swelling previously otherwise just lifestyle management) Most recent labs reviewed  Disc lifstyle change with low sodium diet and exercise

## 2023-07-05 NOTE — Progress Notes (Signed)
Subjective:    Patient ID: Brianna [name], female    DOB: 10/10/1946, 77 y.o.   MRN: [id number]  HPI  Here for health maintenance exam and to review chronic medical problems   Wt Readings from Last 3 Encounters:  07/05/23 144 lb 8 oz (65.5 kg)  06/23/23 144 lb (65.3 kg)  10/05/22 142 lb 12.8 oz (64.8 kg)   27.76 kg/m  Vitals:   07/05/23 1058 07/05/23 1132  BP: (!) 140/70 130/70  Pulse: 86   Temp: 97.9 F (36.6 C)   SpO2: 97%     Immunization History  Administered Date(s) Administered   Fluad Quad(high Dose 65+) 09/27/2019, 09/13/2020   Influenza Split 11/05/2011, 11/06/2012   Influenza Whole 08/22/2008, 10/13/2009, 09/04/2010   Influenza, High Dose Seasonal PF 10/18/2021   Influenza,inj,Quad PF,6+ Mos 08/29/2013, 10/06/2015, 08/13/2016   PFIZER(Purple Top)SARS-COV-2 Vaccination 01/15/2020, 02/05/2020, 09/21/2020, 06/24/2021   PPD Test 08/29/2013   Pfizer Covid-19 Vaccine Bivalent Booster 60yrs & up 10/18/2021   Pneumococcal Conjugate-13 03/19/2014   Pneumococcal Polysaccharide-23 10/21/2010   Td 03/04/1999, 10/13/2009    Health Maintenance Due  Topic Date Due   Zoster Vaccines- Shingrix (1 of 2) Never done   DTaP/Tdap/Td (3 - Tdap) 10/14/2019   Busy summer  Constantly doing something    Tetanus shot - had one last summer at [facility] after a squirrel jumped on her and she got scratched    Shingrix vaccine -interested in that /will check with pharmacy   Gets a flu shot every fall    Mammogram-none due to mastectomy for past breast cancer  Also lymph nodes removed on right  Gets a muscle twinge in r axilla occational  No lumps   Gyn health-no problems    Colon cancer screening -colonoscopy 2016 Cologuard negative 06/2020    Bone health  Dexa  06/2022  osteopenia / improved On prolia  Falls-none  Fractures-none  Supplements  Last vitamin D Lab Results  Component Value Date   VD25OH 46.96 06/28/2023    Exercise : is active  Going back to work at  [employer]- this is a lot of walking and lifting     Mood    06/23/2023    2:09 PM 06/24/2022    8:39 AM 06/21/2022    2:10 PM 06/12/2021    2:01 PM 06/11/2020    2:31 PM  Depression screen PHQ 2/9  Decreased Interest 0 0 0 0 0  Down, Depressed, Hopeless 0 0 0 0 0  PHQ - 2 Score 0 0 0 0 0  Altered sleeping  0 0 0   Tired, decreased energy  0 0 0   Change in appetite  0 0 0   Feeling bad or failure about yourself   0 0 0   Trouble concentrating  0 0 0   Moving slowly or fidgety/restless  0 0 0   Suicidal thoughts  0 0 0   PHQ-9 Score  0 0 0   Difficult doing work/chores   Not difficult at all Not difficult at all     GERD Omeprazole 20 mg daily prn - now taking almost every day  Takes B12 and D   Lab Results  Component Value Date   VITAMINB12 396 06/15/2021     Fatty liver Lab Results  Component Value Date   ALT 26 06/28/2023   AST 15 06/28/2023   ALKPHOS 52 06/28/2023   BILITOT 0.5 06/28/2023     HTN bp is stable today  No cp or palpitations or headaches or edema  No side effects to medicines  BP Readings from Last 3 Encounters:  07/05/23 130/70  10/05/22 138/64  06/24/22 128/68    Hydrochlorothiazide 12.5 mg prn for edema - no regular medicines   Lab Results  Component Value Date   NA 140 06/28/2023   K 4.7 06/28/2023   CO2 29 06/28/2023   GLUCOSE 147 (H) 06/28/2023   BUN 16 06/28/2023   CREATININE 0.99 06/28/2023   CALCIUM 9.4 06/28/2023   GFR 54.38 (L) 06/28/2023   EGFR 59 (L) 12/18/2021   GFRNONAA >60 12/03/2010    Hyperlipidemia Lab Results  Component Value Date   CHOL 245 (H) 06/28/2023   CHOL 207 (H) 06/18/2022   CHOL 226 (H) 06/15/2021   Lab Results  Component Value Date   HDL 47.40 06/28/2023   HDL 49.00 06/18/2022   HDL 42.90 06/15/2021   Lab Results  Component Value Date   LDLCALC 171 (H) 06/28/2023   LDLCALC 134 (H) 06/18/2022   LDLCALC 152 (H) 06/15/2021   Lab Results  Component Value Date   TRIG 132.0 06/28/2023    TRIG 122.0 06/18/2022   TRIG 154.0 (H) 06/15/2021   Lab Results  Component Value Date   CHOLHDL 5 06/28/2023   CHOLHDL 4 06/18/2022   CHOLHDL 5 06/15/2021   Lab Results  Component Value Date   LDLDIRECT 154.0 05/03/2019   LDLDIRECT 119.9 10/30/2012   LDLDIRECT 136.2 11/02/2011  Intolerant to statins incl pravachol and zetia in the past   This got worse after she stopped eating almonds   Cheese Sausage     Prediabetes Lab Results  Component Value Date   HGBA1C 6.2 06/28/2023   Loves starches Not a lot of sweets       Patient Active Problem List   Diagnosis Date Noted   Pre-employment examination 08/29/2013    Priority: Medium    Colon cancer screening 07/05/2023   Current use of proton pump inhibitor 06/14/2021   Hypertension, essential 06/05/2020   Plantar fasciitis 06/19/2018   Low back pain 06/19/2018   Routine general medical examination at a health care facility 05/10/2018   Chronic venous insufficiency 09/10/2016   Pedal edema 08/13/2016   Prediabetes 03/21/2015   Nocturnal leg cramps 03/21/2015   Estrogen deficiency 03/21/2015   History of breast cancer 12/26/2014   Encounter for Medicare annual wellness exam 02/28/2014   Vitamin D deficiency 10/11/2008   Hyperlipidemia 07/07/2007   GERD 07/07/2007   Osteoporosis 07/07/2007   Fatty liver 07/07/2007   Past Medical History:  Diagnosis Date   Allergic rhinitis    Breast carcinoma (HCC) 10/11   right- eventual double mastectomy in 2012   GERD (gastroesophageal reflux disease)    Hyperlipidemia    Osteoporosis    fosamax started 9/08   Shingles    Vitamin D deficiency    Past Surgical History:  Procedure Laterality Date   BREAST BIOPSY  10/11   invasive and in situ mam carcinoma   BREAST LUMPECTOMY  11/11   CATARACT EXTRACTION     does not remember when   MASTECTOMY  1/12   double- Dr. Dwain Sarna   MASTECTOMY, RADICAL Bilateral 11/2010   PARTIAL HYSTERECTOMY  1980's   non cancer,  ovaries intact   WRIST FRACTURE SURGERY Left    in the 1990's   Social History   Tobacco Use   Smoking status: Never   Smokeless tobacco: Never  Vaping Use   Vaping status:  Never Used  Substance Use Topics   Alcohol use: Yes    Alcohol/week: 0.0 standard drinks of alcohol    Comment: wine rarely   Drug use: No   Family History  Problem Relation Age of Onset   Dementia Mother    Cancer Father        esophageal   Diabetes Brother    Cancer Paternal Aunt        ovarian   Allergies  Allergen Reactions   Pravachol     Myalgias  (As of visit on 05/17/2013, patient is taking Pravachol, but continues to have muscle pain)   Zetia [Ezetimibe] Other (See Comments)    Ineffective, elevated liver enzymes   Current Outpatient Medications on File Prior to Visit  Medication Sig Dispense Refill   Cholecalciferol (VITAMIN D-3) 125 MCG (5000 UT) TABS Take by mouth.     Cholecalciferol (VITAMIN D3 PO) Take 250 mcg by mouth daily.     Cyanocobalamin (VITAMIN B-12) 5000 MCG TBDP Take by mouth.     fexofenadine (ALLEGRA) 180 MG tablet Take otc as directed     hydrochlorothiazide (MICROZIDE) 12.5 MG capsule Take 1 capsule (12.5 mg total) by mouth daily as needed (leg swelling). 30 capsule 1   Multiple Vitamin (MULTIVITAMIN) tablet Take 1 tablet by mouth daily.       No current facility-administered medications on file prior to visit.    Review of Systems  Constitutional:  Negative for activity change, appetite change, fatigue, fever and unexpected weight change.  HENT:  Negative for congestion, ear pain, rhinorrhea, sinus pressure and sore throat.   Eyes:  Negative for pain, redness and visual disturbance.  Respiratory:  Negative for cough, shortness of breath and wheezing.   Cardiovascular:  Negative for chest pain and palpitations.  Gastrointestinal:  Negative for abdominal pain, blood in stool, constipation and diarrhea.  Endocrine: Negative for polydipsia and polyuria.  Genitourinary:   Negative for dysuria, frequency and urgency.  Musculoskeletal:  Positive for arthralgias and back pain. Negative for myalgias.  Skin:  Negative for pallor and rash.  Allergic/Immunologic: Negative for environmental allergies.  Neurological:  Negative for dizziness, syncope and headaches.  Hematological:  Negative for adenopathy. Does not bruise/bleed easily.  Psychiatric/Behavioral:  Negative for decreased concentration and dysphoric mood. The patient is not nervous/anxious.        Objective:   Physical Exam Constitutional:      General: She is not in acute distress.    Appearance: Normal appearance. She is well-developed and normal weight. She is not ill-appearing or diaphoretic.  HENT:     Head: Normocephalic and atraumatic.     Right Ear: Tympanic membrane, ear canal and external ear normal.     Left Ear: Tympanic membrane, ear canal and external ear normal.     Nose: Nose normal. No congestion.     Mouth/Throat:     Mouth: Mucous membranes are moist.     Pharynx: Oropharynx is clear. No posterior oropharyngeal erythema.  Eyes:     General: No scleral icterus.    Extraocular Movements: Extraocular movements intact.     Conjunctiva/sclera: Conjunctivae normal.     Pupils: Pupils are equal, round, and reactive to light.  Neck:     Thyroid: No thyromegaly.     Vascular: No carotid bruit or JVD.  Cardiovascular:     Rate and Rhythm: Normal rate and regular rhythm.     Pulses: Normal pulses.     Heart sounds: Normal heart sounds.  No gallop.  Pulmonary:     Effort: Pulmonary effort is normal. No respiratory distress.     Breath sounds: Normal breath sounds. No wheezing.     Comments: Good air exch Chest:     Chest wall: No tenderness.  Abdominal:     General: Bowel sounds are normal. There is no distension or abdominal bruit.     Palpations: Abdomen is soft. There is no mass.     Tenderness: There is no abdominal tenderness.     Hernia: No hernia is present.   Genitourinary:    Comments: No lumps or tenderness of chest wall or axillae  Musculoskeletal:        General: No tenderness. Normal range of motion.     Cervical back: Normal range of motion and neck supple. No rigidity. No muscular tenderness.     Right lower leg: No edema.     Left lower leg: No edema.     Comments: No kyphosis   Lymphadenopathy:     Cervical: No cervical adenopathy.  Skin:    General: Skin is warm and dry.     Coloration: Skin is not pale.     Findings: No erythema or rash.     Comments: Solar lentigines diffusely   Neurological:     Mental Status: She is alert. Mental status is at baseline.     Cranial Nerves: No cranial nerve deficit.     Motor: No abnormal muscle tone.     Coordination: Coordination normal.     Gait: Gait normal.     Deep Tendon Reflexes: Reflexes are normal and symmetric. Reflexes normal.  Psychiatric:        Mood and Affect: Mood normal.        Cognition and Memory: Cognition and memory normal.           Assessment & Plan:   Problem List Items Addressed This Visit       Cardiovascular and Mediastinum   Hypertension, essential    bp in fair control at this time  BP Readings from Last 1 Encounters:  07/05/23 130/70   No changes needed (was taking hctz 12.5 mg daily as needed for swelling previously otherwise just lifestyle management) Most recent labs reviewed  Disc lifstyle change with low sodium diet and exercise          Digestive   GERD    Taking omeprazole more days than not  Plan to watch B12 and D levels Does supplement  Also encouraged fluid intake       Relevant Medications   omeprazole (PRILOSEC) 20 MG capsule   Fatty liver    Liver labs are in normal range         Musculoskeletal and Integument   Osteoporosis    Dexa reviewed 06/2022  No falls or fracture Tolerating prolia  On vit D Encouraged more strength building exercise         Other   Vitamin D deficiency    Vitamin D level is  therapeutic with current supplementation Disc importance of this to bone and overall health Last vitamin D Lab Results  Component Value Date   VD25OH 46.96 06/28/2023         Routine general medical examination at a health care facility - Primary    Reviewed health habits including diet and exercise and skin cancer prevention Reviewed appropriate screening tests for age  Also reviewed health mt list, fam hx and immunization status , as well as  social and family history   See HPI Labs reviewed and ordered  Had Td last summer-sent for report Plans to get shingrix at pharmacy  Plans flu shot in the fall S/p mastectomy- no mammograms  Cologuard ordered (normal in 2021)  No falls or fracture, dexa utd on prolia and vit D Discussed plan for exercise  PHQ 0        Prediabetes    Lab Results  Component Value Date   HGBA1C 6.2 06/28/2023   This is up  disc imp of low glycemic diet and wt loss to prevent DM2  Re check 3 mo      Hyperlipidemia    Disc goals for lipids and reasons to control them Rev last labs with pt Rev low sat fat diet in detail LDL up significantly  Intol of pravastatin and zetia in past  Plans to change diet  Re check 3 mo  ? If open to diff statin in future if needed       Current use of proton pump inhibitor    Will continue to watch D and B12 Does supplement   Encouraged good fluids for renal health       Colon cancer screening    Cologuard ordered (neg 3 y ago)      Relevant Orders   Cologuard

## 2023-07-05 NOTE — Assessment & Plan Note (Signed)
Lab Results  Component Value Date   HGBA1C 6.2 06/28/2023   This is up  disc imp of low glycemic diet and wt loss to prevent DM2  Re check 3 mo

## 2023-07-05 NOTE — Assessment & Plan Note (Signed)
Reviewed health habits including diet and exercise and skin cancer prevention Reviewed appropriate screening tests for age  Also reviewed health mt list, fam hx and immunization status , as well as social and family history   See HPI Labs reviewed and ordered  Had Td last summer-sent for report Plans to get shingrix at pharmacy  Plans flu shot in the fall S/p mastectomy- no mammograms  Cologuard ordered (normal in 2021)  No falls or fracture, dexa utd on prolia and vit D Discussed plan for exercise  PHQ 0

## 2023-07-05 NOTE — Assessment & Plan Note (Signed)
Liver labs are in normal range

## 2023-07-05 NOTE — Assessment & Plan Note (Signed)
Dexa reviewed 06/2022  No falls or fracture Tolerating prolia  On vit D Encouraged more strength building exercise

## 2023-07-09 DIAGNOSIS — Z1211 Encounter for screening for malignant neoplasm of colon: Secondary | ICD-10-CM | POA: Diagnosis not present

## 2023-10-01 DIAGNOSIS — Z23 Encounter for immunization: Secondary | ICD-10-CM | POA: Diagnosis not present

## 2023-10-05 ENCOUNTER — Encounter: Payer: Self-pay | Admitting: Family Medicine

## 2023-10-05 ENCOUNTER — Other Ambulatory Visit (INDEPENDENT_AMBULATORY_CARE_PROVIDER_SITE_OTHER): Payer: Medicare Other

## 2023-10-05 DIAGNOSIS — E7800 Pure hypercholesterolemia, unspecified: Secondary | ICD-10-CM

## 2023-10-05 DIAGNOSIS — R7303 Prediabetes: Secondary | ICD-10-CM | POA: Diagnosis not present

## 2023-10-05 LAB — LIPID PANEL
Cholesterol: 208 mg/dL — ABNORMAL HIGH (ref 0–200)
HDL: 44.6 mg/dL (ref >39.00)
LDL Cholesterol: 144 mg/dL — ABNORMAL HIGH (ref 0–99)
NonHDL: 163.06
Total CHOL/HDL Ratio: 5
Triglycerides: 97 mg/dL (ref 0.0–149.0)
VLDL: 19.4 mg/dL (ref 0.0–40.0)

## 2023-10-05 LAB — HEMOGLOBIN A1C: Hgb A1c MFr Bld: 5.9 % (ref 4.6–6.5)

## 2023-10-06 MED ORDER — ROSUVASTATIN CALCIUM 5 MG PO TABS: 5.0000 mg | ORAL_TABLET | ORAL | 3 refills | Status: DC

## 2023-10-06 NOTE — Addendum Note (Signed)
Addended by: Roxy Manns A on: 10/06/2023 01:31 PM   Modules accepted: Orders

## 2023-11-18 ENCOUNTER — Other Ambulatory Visit (INDEPENDENT_AMBULATORY_CARE_PROVIDER_SITE_OTHER): Payer: Medicare Other

## 2023-11-18 DIAGNOSIS — E7800 Pure hypercholesterolemia, unspecified: Secondary | ICD-10-CM | POA: Diagnosis not present

## 2023-11-18 LAB — LIPID PANEL
Cholesterol: 166 mg/dL (ref 0–200)
HDL: 49.2 mg/dL (ref >39.00)
LDL Cholesterol: 92 mg/dL (ref 0–99)
NonHDL: 116.97
Total CHOL/HDL Ratio: 3
Triglycerides: 126 mg/dL (ref 0.0–149.0)
VLDL: 25.2 mg/dL (ref 0.0–40.0)

## 2023-11-18 LAB — ALT: ALT: 17 U/L (ref 0–35)

## 2023-11-18 LAB — AST: AST: 15 U/L (ref 0–37)

## 2023-11-22 ENCOUNTER — Telehealth: Payer: Self-pay | Admitting: Family Medicine

## 2023-11-22 NOTE — Telephone Encounter (Signed)
Pt came by office stating she received a bill for $367 for her wellness visit with Dr. Milinda Antis from 06/2023. Pt states she was told there was a coding issues. Please advise. Call back # [phone].

## 2024-01-19 ENCOUNTER — Telehealth: Payer: Self-pay

## 2024-01-19 ENCOUNTER — Other Ambulatory Visit: Payer: Self-pay

## 2024-01-19 DIAGNOSIS — M810 Age-related osteoporosis without current pathological fracture: Secondary | ICD-10-CM

## 2024-01-19 MED ORDER — DENOSUMAB 60 MG/ML ~~LOC~~ SOSY
60.0000 mg | PREFILLED_SYRINGE | Freq: Once | SUBCUTANEOUS | Status: AC
  Administered 2024-02-15: 60 mg via SUBCUTANEOUS

## 2024-01-19 NOTE — Telephone Encounter (Signed)
 Per chart review patient has not received Prolia in over a year. Ok to call patient and restart?

## 2024-01-19 NOTE — Telephone Encounter (Signed)
 Please do  If there is a reason she decided to stop it , let me know as well

## 2024-01-27 NOTE — Telephone Encounter (Signed)
 Copied from CRM [id number]. Topic: Clinical - Medical Advice >> Jan 27, 2024  3:16 PM Melissa C wrote: Reason for CRM: patient is returning call to [facility]. Please give patient a call back. Thank you

## 2024-01-27 NOTE — Telephone Encounter (Signed)
 LM for pt to returncall

## 2024-01-30 ENCOUNTER — Other Ambulatory Visit: Payer: Self-pay

## 2024-01-30 ENCOUNTER — Ambulatory Visit
Admission: EM | Admit: 2024-01-30 | Discharge: 2024-01-30 | Disposition: A | Discharge status: Home / Self Care | Attending: Emergency Medicine | Admitting: Emergency Medicine

## 2024-01-30 ENCOUNTER — Encounter: Payer: Self-pay | Admitting: Emergency Medicine

## 2024-01-30 DIAGNOSIS — J069 Acute upper respiratory infection, unspecified: Secondary | ICD-10-CM | POA: Diagnosis not present

## 2024-01-30 LAB — POC COVID19/FLU A&B COMBO
Covid Antigen, POC: NEGATIVE
Influenza A Antigen, POC: NEGATIVE
Influenza B Antigen, POC: NEGATIVE

## 2024-01-30 MED ORDER — AZITHROMYCIN 250 MG PO TABS: 250.0000 mg | ORAL_TABLET | Freq: Every day | ORAL | 0 refills | Status: DC

## 2024-01-30 MED ORDER — BENZONATATE 100 MG PO CAPS: 100.0000 mg | ORAL_CAPSULE | Freq: Three times a day (TID) | ORAL | 0 refills | Status: DC

## 2024-01-30 NOTE — ED Triage Notes (Signed)
 Patient presents to [facility] for evaluation of n/v/d on Thursday, Friday and Saturday, with that resolving and now nasal congestion and sore throat.  Chills, but denies fever

## 2024-01-30 NOTE — ED Provider Notes (Signed)
 Renaldo Fiddler    CSN: [id number] Arrival date & time: 01/30/24  1126      History   Chief Complaint Chief Complaint  Patient presents with   URI    HPI Brianna [name] is a 78 y.o. female.   Patient presents for evaluation of nausea, vomiting and diarrhea beginning 4 days ago, over the past 2 days has begun to experience nasal congestion, nonproductive cough, sore throat and chills.  Vomiting and diarrhea have resolved.  Altered taste but able to tolerate some food and liquids, decreased consumption.  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  Has attempted use [facility] sinus cold and flu.  Past Medical History:  Diagnosis Date   Allergic rhinitis    Breast carcinoma (HCC) 10/11   right- eventual double mastectomy in 2012   GERD (gastroesophageal reflux disease)    Hyperlipidemia    Osteoporosis    fosamax started 9/08   Shingles    Vitamin D deficiency     Patient Active Problem List   Diagnosis Date Noted   Colon cancer screening 07/05/2023   Current use of proton pump inhibitor 06/14/2021   Hypertension, essential 06/05/2020   Plantar fasciitis 06/19/2018   Low back pain 06/19/2018   Routine general medical examination at a health care facility 05/10/2018   Chronic venous insufficiency 09/10/2016   Pedal edema 08/13/2016   Prediabetes 03/21/2015   Nocturnal leg cramps 03/21/2015   Estrogen deficiency 03/21/2015   History of breast cancer 12/26/2014   Encounter for Medicare annual wellness exam 02/28/2014   Pre-employment examination 08/29/2013   Vitamin D deficiency 10/11/2008   Hyperlipidemia 07/07/2007   GERD 07/07/2007   Osteoporosis 07/07/2007   Fatty liver 07/07/2007    Past Surgical History:  Procedure Laterality Date   BREAST BIOPSY  10/11   invasive and in situ mam carcinoma   BREAST LUMPECTOMY  11/11   CATARACT EXTRACTION     does not remember when   MASTECTOMY  1/12   double- Dr. Dwain Sarna   MASTECTOMY, RADICAL Bilateral 11/2010    PARTIAL HYSTERECTOMY  1980's   non cancer, ovaries intact   WRIST FRACTURE SURGERY Left    in the 1990's    OB History   No obstetric history on file.      Home Medications    Prior to Admission medications   Medication Sig Start Date End Date Taking? Authorizing Provider  azithromycin (ZITHROMAX) 250 MG tablet Take 1 tablet (250 mg total) by mouth daily. Take first 2 tablets together, then 1 every day until finished. 01/30/24  Yes Ivonna Kinnick R, NP  benzonatate (TESSALON) 100 MG capsule Take 1 capsule (100 mg total) by mouth every 8 (eight) hours. 01/30/24  Yes Loreley Schwall R, NP  Cholecalciferol (VITAMIN D-3) 125 MCG (5000 UT) TABS Take by mouth.    [provider]  Cholecalciferol (VITAMIN D3 PO) Take 250 mcg by mouth daily.    [provider]  Cyanocobalamin (VITAMIN B-12) 5000 MCG TBDP Take by mouth.    [provider]  fexofenadine (ALLEGRA) 180 MG tablet Take otc as directed    [provider]  hydrochlorothiazide (MICROZIDE) 12.5 MG capsule Take 1 capsule (12.5 mg total) by mouth daily as needed (leg swelling). 10/05/22   Tower, Audrie Gallus, MD  Multiple Vitamin (MULTIVITAMIN) tablet Take 1 tablet by mouth daily.      [provider]  omeprazole (PRILOSEC) 20 MG capsule Take 1 capsule (20 mg total) by  mouth daily as needed. 07/05/23   Tower, Audrie Gallus, MD  rosuvastatin (CRESTOR) 5 MG tablet Take 1 tablet (5 mg total) by mouth every other day. 10/06/23   Tower, Audrie Gallus, MD    Family History Family History  Problem Relation Age of Onset   Dementia Mother    Cancer Father        esophageal   Diabetes Brother    Cancer Paternal Aunt        ovarian    Social History Social History   Tobacco Use   Smoking status: Never   Smokeless tobacco: Never  Vaping Use   Vaping status: Never Used  Substance Use Topics   Alcohol use: Yes    Alcohol/week: 0.0 standard drinks of alcohol    Comment: wine rarely   Drug use: No      Allergies   Pravachol and Zetia [ezetimibe]   Review of Systems Review of Systems   Physical Exam Triage Vital Signs ED Triage Vitals [01/30/24 1152]  Encounter Vitals Group     BP (!) 145/79     Systolic BP Percentile      Diastolic BP Percentile      Pulse Rate 80     Resp 18     Temp 97.7 F (36.5 C)     Temp Source Temporal     SpO2 98 %     Weight      Height      Head Circumference      Peak Flow      Pain Score 0     Pain Loc      Pain Education      Exclude from Growth Chart    No data found.  Updated Vital Signs BP (!) 145/79 (BP Location: Left Arm)   Pulse 80   Temp 97.7 F (36.5 C) (Temporal)   Resp 18   SpO2 98%   Visual Acuity Right Eye Distance:   Left Eye Distance:   Bilateral Distance:    Right Eye Near:   Left Eye Near:    Bilateral Near:     Physical Exam Constitutional:      Appearance: Normal appearance.  HENT:     Head: Normocephalic.     Right Ear: Tympanic membrane, ear canal and external ear normal.     Left Ear: Tympanic membrane, ear canal and external ear normal.     Nose: Congestion present. No rhinorrhea.     Mouth/Throat:     Pharynx: No oropharyngeal exudate or posterior oropharyngeal erythema.  Eyes:     Extraocular Movements: Extraocular movements intact.  Cardiovascular:     Rate and Rhythm: Normal rate and regular rhythm.     Pulses: Normal pulses.     Heart sounds: Normal heart sounds.  Pulmonary:     Effort: Pulmonary effort is normal.     Breath sounds: Normal breath sounds.  Musculoskeletal:     Cervical back: Normal range of motion and neck supple.  Neurological:     Mental Status: She is alert and oriented to person, place, and time. Mental status is at baseline.      UC Treatments / Results  Labs (all labs ordered are listed, but only abnormal results are displayed) Labs Reviewed  POC COVID19/FLU A&B COMBO - Normal    EKG   Radiology No results found.  Procedures Procedures  (including critical care time)  Medications Ordered in UC Medications - No data to display  Initial Impression /  Assessment and Plan / UC Course  I have reviewed the triage vital signs and the nursing notes.  Pertinent labs & imaging results that were available during my care of the patient were reviewed by me and considered in my medical decision making (see chart for details).  Acute URI  Patient is in no signs of distress nor toxic appearing.  Vital signs are stable.  Low suspicion for pneumonia, pneumothorax or bronchitis and therefore will defer imaging.  COVID and flu testing negative.  Prophylactically placed on azithromycin and prescribed Tessalon for cough.May use additional over-the-counter medications as needed for supportive care.  May follow-up with urgent care as needed if symptoms persist or worsen.  Note given.   Final Clinical Impressions(s) / UC Diagnoses   Final diagnoses:  Acute URI     Discharge Instructions      Your symptoms today are most likely being caused by a virus and should steadily improve in time it can take up to 7 to 10 days before you truly start to see a turnaround however things will get better   placed on antibiotic to ideally present symptoms from worsening azithromycin as directed  You may use Tessalon pill every 8 hours as needed for cough May use in addition to over-the-counter medications    You can take Tylenol and/or Ibuprofen as needed for fever reduction and pain relief.   For cough: honey 1/2 to 1 teaspoon (you can dilute the honey in water or another fluid).  You can also use guaifenesin and dextromethorphan for cough. You can use a humidifier for chest congestion and cough.  If you don't have a humidifier, you can sit in the bathroom with the hot shower running.      For sore throat: try warm salt water gargles, cepacol lozenges, throat spray, warm tea or water with lemon/honey, popsicles or ice, or OTC cold relief medicine for  throat discomfort.   For congestion: take a daily anti-histamine like Zyrtec, Claritin, and a oral decongestant, such as pseudoephedrine.  You can also use Flonase 1-2 sprays in each nostril daily.   It is important to stay hydrated: drink plenty of fluids (water, gatorade/powerade/pedialyte, juices, or teas) to keep your throat moisturized and help further relieve irritation/discomfort.    ED Prescriptions     Medication Sig Dispense Auth. Provider   azithromycin (ZITHROMAX) 250 MG tablet Take 1 tablet (250 mg total) by mouth daily. Take first 2 tablets together, then 1 every day until finished. 6 tablet Dellar Traber R, NP   benzonatate (TESSALON) 100 MG capsule Take 1 capsule (100 mg total) by mouth every 8 (eight) hours. 21 capsule Jaray Boliver, Elita Boone, NP      PDMP not reviewed this encounter.   Valinda Hoar, NP 01/30/24 1229

## 2024-01-30 NOTE — Discharge Instructions (Signed)
 Your symptoms today are most likely being caused by a virus and should steadily improve in time it can take up to 7 to 10 days before you truly start to see a turnaround however things will get better   placed on antibiotic to ideally present symptoms from worsening azithromycin as directed  You may use Tessalon pill every 8 hours as needed for cough May use in addition to over-the-counter medications    You can take Tylenol and/or Ibuprofen as needed for fever reduction and pain relief.   For cough: honey 1/2 to 1 teaspoon (you can dilute the honey in water or another fluid).  You can also use guaifenesin and dextromethorphan for cough. You can use a humidifier for chest congestion and cough.  If you don't have a humidifier, you can sit in the bathroom with the hot shower running.      For sore throat: try warm salt water gargles, cepacol lozenges, throat spray, warm tea or water with lemon/honey, popsicles or ice, or OTC cold relief medicine for throat discomfort.   For congestion: take a daily anti-histamine like Zyrtec, Claritin, and a oral decongestant, such as pseudoephedrine.  You can also use Flonase 1-2 sprays in each nostril daily.   It is important to stay hydrated: drink plenty of fluids (water, gatorade/powerade/pedialyte, juices, or teas) to keep your throat moisturized and help further relieve irritation/discomfort.

## 2024-01-31 ENCOUNTER — Telehealth: Payer: Self-pay

## 2024-01-31 NOTE — Telephone Encounter (Signed)
 Prolia VOB initiated via [email or web address]

## 2024-02-02 NOTE — Telephone Encounter (Signed)
 Pt ready for scheduling for PROLIA on or after : 02/02/24  Option# 1: Buy/Bill (Office supplied medication)  Out-of-pocket cost due at time of clinic visit: $0  Number of injection/visits approved: ---  Primary: MEDICARE Prolia co-insurance: 0% Admin fee co-insurance: 0%  Secondary: UHC AARP-MEDSUP Prolia co-insurance:  covers the Medicare Part B deductible, co-insurance and 100% of the excess charges Admin fee co-insurance:   Medical Benefit Details: Date Benefits were checked: 01/31/24 Deductible: $0 Met of $257 Required/ Coinsurance: 0%/ Admin Fee: 0%  Prior Auth: N/A PA# Expiration Date:   # of doses approved: ----------------------------------------------------------------------- Option# 2- Med Obtained from pharmacy:  Pharmacy benefit: Copay $--- (Paid to pharmacy) Admin Fee: --- (Pay at clinic)  Prior Auth: N/A PA# Expiration Date:   # of doses approved:   If patient wants fill through the pharmacy benefit please send prescription to:  --- , and include estimated need by date in rx notes. Pharmacy will ship medication directly to the office.  Patient NOT eligible for Prolia Copay Card. Copay Card can make patient's cost as little as $25. Link to apply: https://www.amgensupportplus.com/copay  ** This summary of benefits is an estimation of the patient's out-of-pocket cost. Exact cost may very based on individual plan coverage.

## 2024-02-03 ENCOUNTER — Other Ambulatory Visit: Payer: Self-pay | Admitting: *Deleted

## 2024-02-03 DIAGNOSIS — M810 Age-related osteoporosis without current pathological fracture: Secondary | ICD-10-CM

## 2024-02-03 NOTE — Addendum Note (Signed)
 Addended by: Alvina Chou on: 02/03/2024 11:37 AM   Modules accepted: Orders

## 2024-02-03 NOTE — Telephone Encounter (Signed)
 Please see referral.

## 2024-02-06 NOTE — Telephone Encounter (Signed)
 Copied from CRM [id number]. Topic: General - Other >> Feb 03, 2024  4:53 PM Tiffany S wrote: Reason for CRM: Patient returning call from [place] regarding Prolia injection please follow up with patient Anytime after 230 would be best time reach her

## 2024-02-06 NOTE — Telephone Encounter (Signed)
 See referral

## 2024-02-07 ENCOUNTER — Other Ambulatory Visit (INDEPENDENT_AMBULATORY_CARE_PROVIDER_SITE_OTHER)

## 2024-02-07 DIAGNOSIS — M810 Age-related osteoporosis without current pathological fracture: Secondary | ICD-10-CM | POA: Diagnosis not present

## 2024-02-08 LAB — BASIC METABOLIC PANEL
BUN: 14 mg/dL (ref 6–23)
CO2: 28 meq/L (ref 19–32)
Calcium: 9.4 mg/dL (ref 8.4–10.5)
Chloride: 105 meq/L (ref 96–112)
Creatinine, Ser: 1.01 mg/dL (ref 0.40–1.20)
GFR: 52.86 mL/min — ABNORMAL LOW (ref >60.00)
Glucose, Bld: 105 mg/dL — ABNORMAL HIGH (ref 70–99)
Potassium: 4.7 meq/L (ref 3.5–5.1)
Sodium: 142 meq/L (ref 135–145)

## 2024-02-15 ENCOUNTER — Ambulatory Visit (INDEPENDENT_AMBULATORY_CARE_PROVIDER_SITE_OTHER)

## 2024-02-15 DIAGNOSIS — M810 Age-related osteoporosis without current pathological fracture: Secondary | ICD-10-CM

## 2024-02-15 MED ORDER — DENOSUMAB 60 MG/ML ~~LOC~~ SOSY
60.0000 mg | PREFILLED_SYRINGE | Freq: Once | SUBCUTANEOUS | Status: AC
  Administered 2024-08-22: 60 mg via SUBCUTANEOUS

## 2024-02-15 NOTE — Progress Notes (Signed)
After obtaining consent, and per orders of Dr. Milinda Antis, injection of Prolia given SQ in left arm by Valentino Nose. Patient tolerated injection well.

## 2024-05-07 ENCOUNTER — Other Ambulatory Visit: Payer: Self-pay | Admitting: Family Medicine

## 2024-06-12 ENCOUNTER — Other Ambulatory Visit: Payer: Self-pay | Admitting: Family Medicine

## 2024-06-13 NOTE — Telephone Encounter (Signed)
 Takes prn/ infrequently  Refilled

## 2024-06-13 NOTE — Telephone Encounter (Signed)
 Last filled on 06/04/22 #30 tab/ 1 refill  CPE scheduled on 07/05/24,   Since it's been 2 yrs since last fill will route to PCP for review

## 2024-06-24 ENCOUNTER — Telehealth: Payer: Self-pay | Admitting: Family Medicine

## 2024-06-24 DIAGNOSIS — Z79899 Other long term (current) drug therapy: Secondary | ICD-10-CM

## 2024-06-24 DIAGNOSIS — E7800 Pure hypercholesterolemia, unspecified: Secondary | ICD-10-CM

## 2024-06-24 DIAGNOSIS — I10 Essential (primary) hypertension: Secondary | ICD-10-CM

## 2024-06-24 DIAGNOSIS — E559 Vitamin D deficiency, unspecified: Secondary | ICD-10-CM

## 2024-06-24 DIAGNOSIS — R7303 Prediabetes: Secondary | ICD-10-CM

## 2024-06-24 DIAGNOSIS — M810 Age-related osteoporosis without current pathological fracture: Secondary | ICD-10-CM

## 2024-06-24 NOTE — Telephone Encounter (Signed)
-----   Message from Rocky FORBES Chock sent at 06/12/2024 11:19 AM EDT ----- Regarding: Labs for Thursday 8.7.25 Please put physical lab orders in future. Thank you, Rocky

## 2024-06-27 ENCOUNTER — Ambulatory Visit (INDEPENDENT_AMBULATORY_CARE_PROVIDER_SITE_OTHER): Payer: PRIVATE HEALTH INSURANCE

## 2024-06-27 VITALS — Ht 60.5 in | Wt 144.0 lb

## 2024-06-27 DIAGNOSIS — Z0000 Encounter for general adult medical examination without abnormal findings: Secondary | ICD-10-CM

## 2024-06-27 NOTE — Patient Instructions (Signed)
 Brianna [name] , Thank you for taking time out of your busy schedule to complete your Annual Wellness Visit with me. I enjoyed our conversation and look forward to speaking with you again next year. I, as well as your care team,  appreciate your ongoing commitment to your health goals. Please review the following plan we discussed and let me know if I can assist you in the future. Your Game plan/ To Do List    Referrals: If you haven't heard from the office you've been referred to, please reach out to them at the phone provided.   Follow up Visits: We will see or speak with you next year for your Next Medicare AWV with our clinical staff 06/28/25 @ 10:10am televisit Have you seen your provider in the last 6 months (3 months if uncontrolled diabetes)? Yes  Clinician Recommendations:  Aim for 30 minutes of exercise or brisk walking, 6-8 glasses of water, and 5 servings of fruits and vegetables each day.       This is a list of the screenings recommended for you:  Health Maintenance  Topic Date Due   Zoster (Shingles) Vaccine (1 of 2) Never done   DTaP/Tdap/Td vaccine (3 - Tdap) 10/14/2019   COVID-19 Vaccine (7 - Pfizer risk 2024-25 season) 03/30/2024   Flu Shot  06/22/2024   Medicare Annual Wellness Visit  06/27/2025   Pneumococcal Vaccine for age over 73  Completed   DEXA scan (bone density measurement)  Completed   Hepatitis B Vaccine  Aged Out   HPV Vaccine  Aged Out   Meningitis B Vaccine  Aged Out   Colon Cancer Screening  Discontinued   Hepatitis C Screening  Discontinued    Advanced directives: (Copy Requested) Please bring a copy of your health care power of attorney and living will to the office to be added to your chart at your convenience. You can mail to [facility] [address] [place], [place] [address] or email to [email or web address] Advance Care Planning is important because it:  [x]  Makes sure you receive the medical care that is consistent with  your values, goals, and preferences  [x]  It provides guidance to your family and loved ones and reduces their decisional burden about whether or not they are making the right decisions based on your wishes.  Follow the link provided in your after visit summary or read over the paperwork we have mailed to you to help you started getting your Advance Directives in place. If you need assistance in completing these, please reach out to us  so that we can help you!

## 2024-06-27 NOTE — Progress Notes (Addendum)
 Subjective:   Brianna [name] is a 78 y.o. who presents for a Medicare Wellness preventive visit.  As a reminder, Annual Wellness Visits don't include a physical exam, and some assessments may be limited, especially if this visit is performed virtually. We may recommend an in-person follow-up visit with your provider if needed.  Visit Complete: Virtual I connected with  [name] on 06/27/24 by a audio enabled telemedicine application and verified that I am speaking with the correct person using two identifiers.  Patient Location: Home  Provider Location: Office/Clinic  I discussed the limitations of evaluation and management by telemedicine. The patient expressed understanding and agreed to proceed.  Vital Signs: Because this visit was a virtual/telehealth visit, some criteria may be missing or patient reported. Any vitals not documented were not able to be obtained and vitals that have been documented are patient reported.  VideoDeclined- This patient declined Librarian, academic. Therefore the visit was completed with audio only.  Persons Participating in Visit: Patient.  AWV Questionnaire: Yes: Patient Medicare AWV questionnaire was completed by the patient on 06/26/24; I have confirmed that all information answered by patient is correct and no changes since this date.  Cardiac Risk Factors include: advanced age (>52men, >62 women);dyslipidemia;hypertension;sedentary lifestyle     Objective:    Today's Vitals   06/27/24 1014  Weight: 144 lb (65.3 kg)  Height: 5' 0.5 (1.537 m)   Body mass index is 27.66 kg/m.     06/27/2024   10:23 AM 06/23/2023    2:10 PM 06/21/2022    2:13 PM 08/04/2021   10:49 PM 06/12/2021    1:58 PM 05/03/2019    8:08 AM 04/19/2018    8:26 AM  Advanced Directives  Does Patient Have a Medical Advance Directive? Yes No No No No No No   Type of Estate agent of [place];Living will        Does patient  want to make changes to medical advance directive?     No - Patient declined    Copy of Healthcare Power of Attorney in Chart? No - copy requested        Would patient like information on creating a medical advance directive?  No - Patient declined No - Patient declined No - Patient declined  No - Patient declined  No - Patient declined      Data saved with a previous flowsheet row definition    Current Medications (verified) Outpatient Encounter Medications as of 06/27/2024  Medication Sig   Cholecalciferol (VITAMIN D -3) 125 MCG (5000 UT) TABS Take by mouth.   Cholecalciferol (VITAMIN D3 PO) Take 250 mcg by mouth daily.   Cyanocobalamin  (VITAMIN B-12) 5000 MCG TBDP Take by mouth.   fexofenadine (ALLEGRA) 180 MG tablet Take otc as directed   hydrochlorothiazide  (MICROZIDE ) 12.5 MG capsule TAKE 1 CAPSULE (12.5 MG TOTAL) BY MOUTH DAILY AS NEEDED (LEG SWELLING).   omeprazole  (PRILOSEC) 20 MG capsule TAKE 1 CAPSULE BY MOUTH DAILY AS NEEDED.   rosuvastatin  (CRESTOR ) 5 MG tablet Take 1 tablet (5 mg total) by mouth every other day.   azithromycin  (ZITHROMAX ) 250 MG tablet Take 1 tablet (250 mg total) by mouth daily. Take first 2 tablets together, then 1 every day until finished.   benzonatate  (TESSALON ) 100 MG capsule Take 1 capsule (100 mg total) by mouth every 8 (eight) hours. (Patient not taking: Reported on 06/27/2024)   Multiple Vitamin (MULTIVITAMIN) tablet Take 1 tablet by mouth daily.  Facility-Administered Encounter Medications as of 06/27/2024  Medication   [START ON 08/17/2024] denosumab  (PROLIA ) injection 60 mg    Allergies (verified) Pravachol  and Zetia [ezetimibe]   History: Past Medical History:  Diagnosis Date   Allergic rhinitis    Breast carcinoma (HCC) 10/11   right- eventual double mastectomy in 2012   GERD (gastroesophageal reflux disease)    Hyperlipidemia    Osteoporosis    fosamax  started 9/08   Shingles    Vitamin D  deficiency    Past Surgical History:   Procedure Laterality Date   BREAST BIOPSY  10/11   invasive and in situ mam carcinoma   BREAST LUMPECTOMY  11/11   CATARACT EXTRACTION     does not remember when   MASTECTOMY  1/12   double- Dr. Ebbie   MASTECTOMY, RADICAL Bilateral 11/2010   PARTIAL HYSTERECTOMY  1980's   non cancer, ovaries intact   WRIST FRACTURE SURGERY Left    in the 1990's   Family History  Problem Relation Age of Onset   Dementia Mother    Cancer Father        esophageal   Diabetes Brother    Cancer Paternal Aunt        ovarian   Social History   Socioeconomic History   Marital status: Divorced    Spouse name: n/a   Number of children: 1   Years of education: Not on file   Highest education level: 12th grade  Occupational History   Occupation: retired    Associate Professor: [employer]  Tobacco Use   Smoking status: Never   Smokeless tobacco: Never  Vaping Use   Vaping status: Never Used  Substance and Sexual Activity   Alcohol use: Yes    Alcohol/week: 0.0 standard drinks of alcohol    Comment: wine rarely   Drug use: No   Sexual activity: Not Currently  Other Topics Concern   Not on file  Social History Narrative   Not on file   Social Drivers of Health   Financial Resource Strain: Low Risk  (06/26/2024)   Overall Financial Resource Strain (CARDIA)    Difficulty of Paying Living Expenses: Not very hard  Food Insecurity: No Food Insecurity (06/26/2024)   Hunger Vital Sign    Worried About Running Out of Food in the Last Year: Never true    Ran Out of Food in the Last Year: Never true  Transportation Needs: No Transportation Needs (06/26/2024)   PRAPARE - Administrator, Civil Service (Medical): No    Lack of Transportation (Non-Medical): No  Physical Activity: Inactive (06/26/2024)   Exercise Vital Sign    Days of Exercise per Week: 0 days    Minutes of Exercise per Session: Not on file  Stress: No Stress Concern Present (06/26/2024)   [employer] of Occupational  Health - Occupational Stress Questionnaire    Feeling of Stress: Not at all  Social Connections: Moderately Integrated (06/26/2024)   Social Connection and Isolation Panel    Frequency of Communication with Friends and Family: More than three times a week    Frequency of Social Gatherings with Friends and Family: Twice a week    Attends Religious Services: More than 4 times per year    Active Member of [employer] or Organizations: Yes    Attends Engineer, structural: More than 4 times per year    Marital Status: Divorced    Tobacco Counseling Counseling given: Not Answered   Clinical Intake:  Pre-visit preparation completed: Yes  Pain : No/denies pain     BMI - recorded: 27.66 Nutritional Status: BMI 25 -29 Overweight Nutritional Risks: None Diabetes: No  Lab Results  Component Value Date   HGBA1C 5.9 10/05/2023   HGBA1C 6.2 06/28/2023   HGBA1C 6.0 06/18/2022     How often do you need to have someone help you when you read instructions, pamphlets, or other written materials from your doctor or pharmacy?: 1 - Never  Interpreter Needed?: No  Comments: lives alone Information entered by :: B.Aveline Daus,LPN   Activities of Daily Living     06/26/2024    1:29 PM  In your present state of health, do you have any difficulty performing the following activities:  Hearing? 0  Vision? 0  Difficulty concentrating or making decisions? 0  Walking or climbing stairs? 0  Dressing or bathing? 0  Doing errands, shopping? 0  Preparing Food and eating ? N  Using the Toilet? N  In the past six months, have you accidently leaked urine? Y  Do you have problems with loss of bowel control? N  Managing your Medications? N  Managing your Finances? N  Housekeeping or managing your Housekeeping? N    Patient Care Team: Tower, Laine LABOR, MD as PCP - General Cleotilde Sewer, OD as Consulting Physician (Optometry)  I have updated your Care Teams any recent Medical Services you may have  received from other providers in the past year.     Assessment:   This is a routine wellness examination for [place].  Hearing/Vision screen Hearing Screening - Comments:: Pt says her hearing is not good as it use to be but can hear alright Vision Screening - Comments:: Pt says her vision is good, readers only Dr Sewer Cleotilde   Goals Addressed             This Visit's Progress    DIET - EAT MORE FRUITS AND VEGETABLES   On track    06/27/24-     Patient advised to follow up with AWV and vaccines   On track    06/27/24     COMPLETED: Patient Stated   On track    Starting 05/03/2019, I will continue to take medications as prescribed.      Patient Stated   On track    06/27/24-, I will maintain and continue medications as prescribed.      Patient Stated   On track    06/27/24-Live to be 78 years old.       Depression Screen     06/27/2024   10:20 AM 06/23/2023    2:09 PM 06/24/2022    8:39 AM 06/21/2022    2:10 PM 06/12/2021    2:01 PM 06/11/2020    2:31 PM 05/03/2019    8:06 AM  PHQ 2/9 Scores  PHQ - 2 Score 0 0 0 0 0 0 0  PHQ- 9 Score   0 0 0  0    Fall Risk     06/26/2024    1:29 PM 06/23/2023    8:13 AM 06/24/2022    8:39 AM 06/21/2022    2:14 PM 06/12/2021    2:00 PM  Fall Risk   Falls in the past year? 0 0 0 0 0  Number falls in past yr: 0 0  0 0  Injury with Fall? 0 0  0 0  Risk for fall due to : No Fall Risks No Fall Risks  No Fall Risks  Medication side effect  Follow up Education provided;Falls prevention discussed Falls evaluation completed;Follow up appointment Falls evaluation completed  Falls evaluation completed  Falls prevention discussed;Falls evaluation completed      Data saved with a previous flowsheet row definition    MEDICARE RISK AT HOME:  Medicare Risk at Home Any stairs in or around the home?: (Patient-Rptd) Yes If so, are there any without handrails?: (Patient-Rptd) No Home free of loose throw rugs in walkways, pet beds, electrical cords, etc?:  (Patient-Rptd) No Adequate lighting in your home to reduce risk of falls?: (Patient-Rptd) Yes Life alert?: (Patient-Rptd) No Use of a cane, walker or w/c?: (Patient-Rptd) No Grab bars in the bathroom?: (Patient-Rptd) No Shower chair or bench in shower?: (Patient-Rptd) No Elevated toilet seat or a handicapped toilet?: (Patient-Rptd) Yes  TIMED UP AND GO:  Was the test performed?  No  Cognitive Function: 6CIT completed    06/12/2021    2:03 PM 05/03/2019    8:10 AM 04/19/2018    8:23 AM 04/19/2017    2:57 PM 03/23/2016    3:03 PM  MMSE - Mini Mental State Exam  Orientation to time 5 5 5 5  5    Orientation to Place 5 5 5 5  5    Registration 3 3 3 3  3    Attention/ Calculation 5 0 0 0  0   Recall 3 3 3 3  2    Language- name 2 objects  0 0 0  0   Language- repeat 1 1 1 1 1   Language- follow 3 step command  0 3 3  3    Language- read & follow direction  0 0 0  0   Write a sentence  0 0 0  0   Copy design  0 0 0  0   Total score  17 20 20  19       Data saved with a previous flowsheet row definition        06/27/2024   10:24 AM 06/23/2023    2:13 PM  6CIT Screen  What Year? 0 points 0 points  What month? 0 points 0 points  What time? 0 points 0 points  Count back from 20 0 points 0 points  Months in reverse 0 points 0 points  Repeat phrase 2 points 0 points  Total Score 2 points 0 points    Immunizations Immunization History  Administered Date(s) Administered   Fluad Quad(high Dose 65+) 09/27/2019, 09/13/2020   Influenza Split 11/05/2011, 11/06/2012   Influenza Whole 08/22/2008, 10/13/2009, 09/04/2010   Influenza, High Dose Seasonal PF 10/18/2021, 10/01/2023   Influenza,inj,Quad PF,6+ Mos 08/29/2013, 10/06/2015, 08/13/2016   PFIZER(Purple Top)SARS-COV-2 Vaccination 01/15/2020, 02/05/2020, 09/21/2020, 06/24/2021   PPD Test 08/29/2013   Pfizer Covid-19 Vaccine Bivalent Booster 53yrs & up 10/18/2021   Pfizer(Comirnaty)Fall Seasonal Vaccine 12 years and older 10/01/2023    Pneumococcal Conjugate-13 03/19/2014   Pneumococcal Polysaccharide-23 10/21/2010   Td 03/04/1999, 10/13/2009    Screening Tests Health Maintenance  Topic Date Due   Zoster Vaccines- Shingrix (1 of 2) Never done   DTaP/Tdap/Td (3 - Tdap) 10/14/2019   COVID-19 Vaccine (7 - Pfizer risk 2024-25 season) 03/30/2024   INFLUENZA VACCINE  06/22/2024   Medicare Annual Wellness (AWV)  06/27/2025   Pneumococcal Vaccine: 50+ Years  Completed   DEXA SCAN  Completed   Hepatitis B Vaccines  Aged Out   HPV VACCINES  Aged Out   Meningococcal B Vaccine  Aged Out   Colonoscopy  Discontinued  Hepatitis C Screening  Discontinued    Health Maintenance  Health Maintenance Due  Topic Date Due   Zoster Vaccines- Shingrix (1 of 2) Never done   DTaP/Tdap/Td (3 - Tdap) 10/14/2019   COVID-19 Vaccine (7 - Pfizer risk 2024-25 season) 03/30/2024   INFLUENZA VACCINE  06/22/2024   Health Maintenance Items Addressed: None at this time  Additional Screening:  Vision Screening: Recommended annual ophthalmology exams for early detection of glaucoma and other disorders of the eye. Would you like a referral to an eye doctor? No    Dental Screening: Recommended annual dental exams for proper oral hygiene  Community Resource Referral / Chronic Care Management: CRR required this visit?  No   CCM required this visit?  No   Plan:    I have personally reviewed and noted the following in the patient's chart:   Medical and social history Use of alcohol, tobacco or illicit drugs  Current medications and supplements including opioid prescriptions. Patient is not currently taking opioid prescriptions. Functional ability and status Nutritional status Physical activity Advanced directives List of other physicians Hospitalizations, surgeries, and ER visits in previous 12 months Vitals Screenings to include cognitive, depression, and falls Referrals and appointments  In addition, I have reviewed and  discussed with patient certain preventive protocols, quality metrics, and best practice recommendations. A written personalized care plan for preventive services as well as general preventive health recommendations were provided to patient.   Erminio LITTIE Saris, LPN   11/24/7972   After Visit Summary: (MyChart) Due to this being a telephonic visit, the after visit summary with patients personalized plan was offered to patient via MyChart   Notes: Nothing significant to report at this time.

## 2024-06-28 ENCOUNTER — Ambulatory Visit: Payer: Self-pay | Admitting: Family Medicine

## 2024-06-28 ENCOUNTER — Other Ambulatory Visit (INDEPENDENT_AMBULATORY_CARE_PROVIDER_SITE_OTHER): Payer: PRIVATE HEALTH INSURANCE

## 2024-06-28 DIAGNOSIS — E559 Vitamin D deficiency, unspecified: Secondary | ICD-10-CM

## 2024-06-28 DIAGNOSIS — R7303 Prediabetes: Secondary | ICD-10-CM | POA: Diagnosis not present

## 2024-06-28 DIAGNOSIS — I10 Essential (primary) hypertension: Secondary | ICD-10-CM | POA: Diagnosis not present

## 2024-06-28 DIAGNOSIS — Z79899 Other long term (current) drug therapy: Secondary | ICD-10-CM

## 2024-06-28 DIAGNOSIS — E7800 Pure hypercholesterolemia, unspecified: Secondary | ICD-10-CM

## 2024-06-28 LAB — COMPREHENSIVE METABOLIC PANEL WITH GFR
ALT: 26 U/L (ref 0–35)
AST: 21 U/L (ref 0–37)
Albumin: 3.9 g/dL (ref 3.5–5.2)
Alkaline Phosphatase: 59 U/L (ref 39–117)
BUN: 12 mg/dL (ref 6–23)
CO2: 29 meq/L (ref 19–32)
Calcium: 9.2 mg/dL (ref 8.4–10.5)
Chloride: 100 meq/L (ref 96–112)
Creatinine, Ser: 0.82 mg/dL (ref 0.40–1.20)
GFR: 67.7 mL/min (ref >60.00)
Glucose, Bld: 175 mg/dL — ABNORMAL HIGH (ref 70–99)
Potassium: 4.6 meq/L (ref 3.5–5.1)
Sodium: 142 meq/L (ref 135–145)
Total Bilirubin: 0.7 mg/dL (ref 0.2–1.2)
Total Protein: 5.9 g/dL — ABNORMAL LOW (ref 6.0–8.3)

## 2024-06-28 LAB — CBC WITH DIFFERENTIAL/PLATELET
Basophils Absolute: 0 K/uL (ref 0.0–0.1)
Basophils Relative: 0.6 % (ref 0.0–3.0)
Eosinophils Absolute: 0.1 K/uL (ref 0.0–0.7)
Eosinophils Relative: 1.6 % (ref 0.0–5.0)
HCT: 36.1 % (ref 36.0–46.0)
Hemoglobin: 11.7 g/dL — ABNORMAL LOW (ref 12.0–15.0)
Lymphocytes Relative: 23.8 % (ref 12.0–46.0)
Lymphs Abs: 2 K/uL (ref 0.7–4.0)
MCHC: 32.4 g/dL (ref 30.0–36.0)
MCV: 79.3 fl (ref 78.0–100.0)
Monocytes Absolute: 0.9 K/uL (ref 0.1–1.0)
Monocytes Relative: 9.9 % (ref 3.0–12.0)
Neutro Abs: 5.5 K/uL (ref 1.4–7.7)
Neutrophils Relative %: 64.1 % (ref 43.0–77.0)
Platelets: 303 K/uL (ref 150.0–400.0)
RBC: 4.55 Mil/uL (ref 3.87–5.11)
RDW: 15.3 % (ref 11.5–15.5)
WBC: 8.6 K/uL (ref 4.0–10.5)

## 2024-06-28 LAB — LIPID PANEL
Cholesterol: 195 mg/dL (ref 0–200)
HDL: 44.7 mg/dL (ref >39.00)
LDL Cholesterol: 122 mg/dL — ABNORMAL HIGH (ref 0–99)
NonHDL: 150.4
Total CHOL/HDL Ratio: 4
Triglycerides: 140 mg/dL (ref 0.0–149.0)
VLDL: 28 mg/dL (ref 0.0–40.0)

## 2024-06-28 LAB — VITAMIN D 25 HYDROXY (VIT D DEFICIENCY, FRACTURES): VITD: 38.74 ng/mL (ref 30.00–100.00)

## 2024-06-28 LAB — TSH: TSH: 4.74 u[IU]/mL (ref 0.35–5.50)

## 2024-06-28 LAB — VITAMIN B12: Vitamin B-12: 1269 pg/mL — ABNORMAL HIGH (ref 211–911)

## 2024-06-28 LAB — HEMOGLOBIN A1C: Hgb A1c MFr Bld: 7.4 % — ABNORMAL HIGH (ref 4.6–6.5)

## 2024-07-05 ENCOUNTER — Ambulatory Visit (INDEPENDENT_AMBULATORY_CARE_PROVIDER_SITE_OTHER)
Admission: RE | Admit: 2024-07-05 | Discharge: 2024-07-05 | Disposition: A | Discharge status: Home / Self Care | Source: Ambulatory Visit | Attending: Family Medicine | Admitting: Family Medicine

## 2024-07-05 ENCOUNTER — Encounter: Payer: Self-pay | Admitting: Family Medicine

## 2024-07-05 ENCOUNTER — Ambulatory Visit: Payer: Medicare Other | Admitting: Family Medicine

## 2024-07-05 VITALS — BP 138/65 | HR 79 | Temp 98.3°F | Wt 146.2 lb

## 2024-07-05 DIAGNOSIS — K760 Fatty (change of) liver, not elsewhere classified: Secondary | ICD-10-CM | POA: Diagnosis not present

## 2024-07-05 DIAGNOSIS — R600 Localized edema: Secondary | ICD-10-CM | POA: Diagnosis not present

## 2024-07-05 DIAGNOSIS — I10 Essential (primary) hypertension: Secondary | ICD-10-CM | POA: Diagnosis not present

## 2024-07-05 DIAGNOSIS — E559 Vitamin D deficiency, unspecified: Secondary | ICD-10-CM

## 2024-07-05 DIAGNOSIS — M810 Age-related osteoporosis without current pathological fracture: Secondary | ICD-10-CM

## 2024-07-05 DIAGNOSIS — E119 Type 2 diabetes mellitus without complications: Secondary | ICD-10-CM

## 2024-07-05 DIAGNOSIS — E7800 Pure hypercholesterolemia, unspecified: Secondary | ICD-10-CM | POA: Diagnosis not present

## 2024-07-05 DIAGNOSIS — K219 Gastro-esophageal reflux disease without esophagitis: Secondary | ICD-10-CM

## 2024-07-05 DIAGNOSIS — Z1211 Encounter for screening for malignant neoplasm of colon: Secondary | ICD-10-CM

## 2024-07-05 DIAGNOSIS — Z79899 Other long term (current) drug therapy: Secondary | ICD-10-CM

## 2024-07-05 DIAGNOSIS — Z0000 Encounter for general adult medical examination without abnormal findings: Secondary | ICD-10-CM

## 2024-07-05 NOTE — Assessment & Plan Note (Signed)
 Cologuard negative 06/2023

## 2024-07-05 NOTE — Assessment & Plan Note (Signed)
 Hydrochlorothiazide  prn  Not needed often Left ankle has always swollen more than right (does have old injury)

## 2024-07-05 NOTE — Progress Notes (Signed)
 Subjective:    Patient ID: Brianna [name], female    DOB: Mar 25, 1946, 78 y.o.   MRN: [id number]  HPI  Here for health maintenance exam and to review chronic medical problems   Wt Readings from Last 3 Encounters:  07/05/24 146 lb 4 oz (66.3 kg)  06/27/24 144 lb (65.3 kg)  07/05/23 144 lb 8 oz (65.5 kg)   28.09 kg/m  Vitals:   07/05/24 1102 07/05/24 1138  BP: (!) 142/80 138/65  Pulse: 79   Temp: 98.3 F (36.8 C)   SpO2: 97%     Immunization History  Administered Date(s) Administered   Fluad Quad(high Dose 65+) 09/27/2019, 09/13/2020   Influenza Split 11/05/2011, 11/06/2012   Influenza Whole 08/22/2008, 10/13/2009, 09/04/2010   Influenza, High Dose Seasonal PF 10/18/2021, 10/01/2023   Influenza,inj,Quad PF,6+ Mos 08/29/2013, 10/06/2015, 08/13/2016   PFIZER(Purple Top)SARS-COV-2 Vaccination 01/15/2020, 02/05/2020, 09/21/2020, 06/24/2021   PPD Test 08/29/2013   Pfizer Covid-19 Vaccine Bivalent Booster 76yrs & up 10/18/2021   Pfizer(Comirnaty)Fall Seasonal Vaccine 12 years and older 10/01/2023   Pneumococcal Conjugate-13 03/19/2014   Pneumococcal Polysaccharide-23 10/21/2010   Td 03/04/1999, 10/13/2009, 06/05/2022    Health Maintenance Due  Topic Date Due   Zoster Vaccines- Shingrix (1 of 2) Never done   Doing well  Helping some friends    Planning to get shingrix vaccine   Total mastectomy in the past  Normal self exam   Gyn health No problems     Colon cancer screening - 06/2023 neg cologuard   Bone health  Dexa  Osteoporosis-taking prolia    Falls- none  Fractures-none  Supplements - vitamin D   Last vitamin D  Lab Results  Component Value Date   VD25OH 38.74 06/28/2024    Exercise  Active No extra exercise  Lots of walking with work  Derm care  Rash on left neck- ? What for    Mood    06/27/2024   10:20 AM 06/23/2023    2:09 PM 06/24/2022    8:39 AM 06/21/2022    2:10 PM 06/12/2021    2:01 PM  Depression screen PHQ 2/9  Decreased  Interest 0 0 0 0 0  Down, Depressed, Hopeless 0 0 0 0 0  PHQ - 2 Score 0 0 0 0 0  Altered sleeping   0 0 0  Tired, decreased energy   0 0 0  Change in appetite   0 0 0  Feeling bad or failure about yourself    0 0 0  Trouble concentrating   0 0 0  Moving slowly or fidgety/restless   0 0 0  Suicidal thoughts   0 0 0  PHQ-9 Score   0 0 0  Difficult doing work/chores    Not difficult at all Not difficult at all      GERD Omeprazole  20 mg prn  Lab Results  Component Value Date   VITAMINB12 1,269 (H) 06/28/2024     HTN  bp is stable today  No cp or palpitations or headaches or edema  No side effects to medicines  BP Readings from Last 3 Encounters:  07/05/24 138/65  01/30/24 (!) 145/79  07/05/23 130/70    Hydrochlorothiazide  12.5 mg prn daily   Lab Results  Component Value Date   NA 142 06/28/2024   K 4.6 06/28/2024   CO2 29 06/28/2024   GLUCOSE 175 (H) 06/28/2024   BUN 12 06/28/2024   CREATININE 0.82 06/28/2024   CALCIUM  9.2 06/28/2024   GFR 67.70 06/28/2024  EGFR 59 (L) 12/18/2021   GFRNONAA >60 12/03/2010    Prediabetes Lab Results  Component Value Date   HGBA1C 7.4 (H) 06/28/2024   HGBA1C 5.9 10/05/2023   HGBA1C 6.2 06/28/2023   Has a family history of dm  Mother 's side and brother   Eating more potatoes    Hyperlipidemia Lab Results  Component Value Date   CHOL 195 06/28/2024   CHOL 166 11/18/2023   CHOL 208 (H) 10/05/2023   Lab Results  Component Value Date   HDL 44.70 06/28/2024   HDL 49.20 11/18/2023   HDL 44.60 10/05/2023   Lab Results  Component Value Date   LDLCALC 122 (H) 06/28/2024   LDLCALC 92 11/18/2023   LDLCALC 144 (H) 10/05/2023   Lab Results  Component Value Date   TRIG 140.0 06/28/2024   TRIG 126.0 11/18/2023   TRIG 97.0 10/05/2023   Lab Results  Component Value Date   CHOLHDL 4 06/28/2024   CHOLHDL 3 11/18/2023   CHOLHDL 5 10/05/2023   Lab Results  Component Value Date   LDLDIRECT 154.0 05/03/2019    LDLDIRECT 119.9 10/30/2012   LDLDIRECT 136.2 11/02/2011   Crestor  5 mg every other day  Hard to remember -not very compliant      Fatty liver in past  Lab Results  Component Value Date   ALT 26 06/28/2024   AST 21 06/28/2024   ALKPHOS 59 06/28/2024   BILITOT 0.7 06/28/2024    Lab Results  Component Value Date   WBC 8.6 06/28/2024   HGB 11.7 (L) 06/28/2024   HCT 36.1 06/28/2024   MCV 79.3 06/28/2024   PLT 303.0 06/28/2024   Donates blood/did it lately   Patient Active Problem List   Diagnosis Date Noted   Colon cancer screening 07/05/2023   Current use of proton pump inhibitor 06/14/2021   Hypertension, essential 06/05/2020   Plantar fasciitis 06/19/2018   Low back pain 06/19/2018   Routine general medical examination at a health care facility 05/10/2018   Chronic venous insufficiency 09/10/2016   Pedal edema 08/13/2016   Prediabetes 03/21/2015   Nocturnal leg cramps 03/21/2015   History of breast cancer 12/26/2014   Encounter for Medicare annual wellness exam 02/28/2014   Vitamin D  deficiency 10/11/2008   Hyperlipidemia 07/07/2007   GERD 07/07/2007   Osteoporosis 07/07/2007   Fatty liver 07/07/2007   Past Medical History:  Diagnosis Date   Allergic rhinitis    Breast carcinoma (HCC) 08/22/2010   right- eventual double mastectomy in 2012   Cataract    GERD (gastroesophageal reflux disease)    Hyperlipidemia    Osteoporosis    fosamax  started 9/08   Shingles    Vitamin D  deficiency    Past Surgical History:  Procedure Laterality Date   ABDOMINAL HYSTERECTOMY     BREAST BIOPSY  08/22/2010   invasive and in situ mam carcinoma   BREAST LUMPECTOMY  09/22/2010   CATARACT EXTRACTION     does not remember when   MASTECTOMY  11/22/2010   double- Dr. Ebbie   MASTECTOMY, RADICAL Bilateral 11/22/2010   PARTIAL HYSTERECTOMY  07/24/1979   non cancer, ovaries intact   WRIST FRACTURE SURGERY Left    in the 1990's   Social History   Tobacco Use    Smoking status: Never   Smokeless tobacco: Never  Vaping Use   Vaping status: Never Used  Substance Use Topics   Alcohol use: Not Currently    Comment: wine rarely   Drug use:  No   Family History  Problem Relation Age of Onset   Dementia Mother    Cancer Father        esophageal   Diabetes Brother    Cancer Paternal Aunt        ovarian   Allergies  Allergen Reactions   Pravachol      Myalgias  (As of visit on 05/17/2013, patient is taking Pravachol , but continues to have muscle pain)   Zetia [Ezetimibe] Other (See Comments)    Ineffective, elevated liver enzymes   Current Outpatient Medications on File Prior to Visit  Medication Sig Dispense Refill   Cholecalciferol (VITAMIN D -3) 125 MCG (5000 UT) TABS Take by mouth.     Cholecalciferol (VITAMIN D3 PO) Take 250 mcg by mouth daily.     Cyanocobalamin  (VITAMIN B-12) 5000 MCG TBDP Take by mouth.     fexofenadine (ALLEGRA) 180 MG tablet Take otc as directed     hydrochlorothiazide  (MICROZIDE ) 12.5 MG capsule TAKE 1 CAPSULE (12.5 MG TOTAL) BY MOUTH DAILY AS NEEDED (LEG SWELLING). 30 capsule 0   omeprazole  (PRILOSEC) 20 MG capsule TAKE 1 CAPSULE BY MOUTH DAILY AS NEEDED. 90 capsule 0   rosuvastatin  (CRESTOR ) 5 MG tablet Take 1 tablet (5 mg total) by mouth every other day. 45 tablet 3   Current Facility-Administered Medications on File Prior to Visit  Medication Dose Route Frequency Provider Last Rate Last Admin   [START ON 08/17/2024] denosumab  (PROLIA ) injection 60 mg  60 mg Subcutaneous Once Tavian Callander A, MD        Review of Systems  Constitutional:  Negative for activity change, appetite change, fatigue, fever and unexpected weight change.  HENT:  Negative for congestion, ear pain, rhinorrhea, sinus pressure and sore throat.   Eyes:  Negative for pain, redness and visual disturbance.  Respiratory:  Negative for cough, shortness of breath and wheezing.   Cardiovascular:  Negative for chest pain and palpitations.   Gastrointestinal:  Negative for abdominal pain, blood in stool, constipation and diarrhea.  Endocrine: Negative for polydipsia and polyuria.  Genitourinary:  Negative for dysuria, frequency and urgency.  Musculoskeletal:  Positive for arthralgias. Negative for back pain and myalgias.  Skin:  Negative for pallor and rash.  Allergic/Immunologic: Negative for environmental allergies.  Neurological:  Negative for dizziness, syncope and headaches.  Hematological:  Negative for adenopathy. Does not bruise/bleed easily.  Psychiatric/Behavioral:  Negative for decreased concentration and dysphoric mood. The patient is not nervous/anxious.        Objective:   Physical Exam Constitutional:      General: She is not in acute distress.    Appearance: Normal appearance. She is well-developed and normal weight. She is not ill-appearing or diaphoretic.  HENT:     Head: Normocephalic and atraumatic.     Right Ear: Tympanic membrane, ear canal and external ear normal.     Left Ear: Tympanic membrane, ear canal and external ear normal.     Nose: Nose normal. No congestion.     Mouth/Throat:     Mouth: Mucous membranes are moist.     Pharynx: Oropharynx is clear. No posterior oropharyngeal erythema.  Eyes:     General: No scleral icterus.    Extraocular Movements: Extraocular movements intact.     Conjunctiva/sclera: Conjunctivae normal.     Pupils: Pupils are equal, round, and reactive to light.  Neck:     Thyroid : No thyromegaly.     Vascular: No carotid bruit or JVD.  Cardiovascular:  Rate and Rhythm: Normal rate and regular rhythm.     Pulses: Normal pulses.     Heart sounds: Normal heart sounds.     No gallop.  Pulmonary:     Effort: Pulmonary effort is normal. No respiratory distress.     Breath sounds: Normal breath sounds. No wheezing.     Comments: Good air exch Chest:     Chest wall: No tenderness.  Abdominal:     General: Bowel sounds are normal. There is no distension or  abdominal bruit.     Palpations: Abdomen is soft. There is no mass.     Tenderness: There is no abdominal tenderness.     Hernia: No hernia is present.  Genitourinary:    Comments: Bilateral mastectomy areas without lumps or new abnormalities  Musculoskeletal:        General: No tenderness. Normal range of motion.     Cervical back: Normal range of motion and neck supple. No rigidity. No muscular tenderness.     Right lower leg: No edema.     Left lower leg: No edema.     Comments: No kyphosis   Lymphadenopathy:     Cervical: No cervical adenopathy.  Skin:    General: Skin is warm and dry.     Coloration: Skin is not pale.     Findings: No erythema or rash.     Comments: Solar lentigines diffusely  Mild papular rash on left neck /no redness   Neurological:     Mental Status: She is alert. Mental status is at baseline.     Cranial Nerves: No cranial nerve deficit.     Motor: No abnormal muscle tone.     Coordination: Coordination normal.     Gait: Gait normal.     Deep Tendon Reflexes: Reflexes are normal and symmetric. Reflexes normal.  Psychiatric:        Mood and Affect: Mood normal.        Cognition and Memory: Cognition and memory normal.           Assessment & Plan:   Problem List Items Addressed This Visit       Cardiovascular and Mediastinum   Hypertension, essential   bp in fair control at this time  BP Readings from Last 1 Encounters:  07/05/24 138/65   No changes needed Most recent labs reviewed  Disc lifstyle change with low sodium diet and exercise  Has not taken hydrochlorothiazide  today (is prn)  Follow up for DM (new)-will discuss arb or ace           Digestive   GERD   Continues omeprazole  20 mg daily prn        Fatty liver   Lab Results  Component Value Date   ALT 26 06/28/2024   AST 21 06/28/2024   ALKPHOS 59 06/28/2024   BILITOT 0.7 06/28/2024   Encouraged to keep working on healthy diet and exercise          Musculoskeletal and Integument   Osteoporosis   Dexa ordered  No falls or fracture On prolia   Encouraged more strength building exercise   Discussed fall prevention, supplements and exercise for bone density        Relevant Orders   DG Bone Density     Other   Vitamin D  deficiency   Vitamin D  level is therapeutic with current supplementation Disc importance of this to bone and overall health Last vitamin D  Lab Results  Component Value Date  VD25OH 38.74 06/28/2024         Routine general medical examination at a health care facility - Primary   Reviewed health habits including diet and exercise and skin cancer prevention Reviewed appropriate screening tests for age  Also reviewed health mt list, fam hx and immunization status , as well as social and family history   See HPI Labs reviewed and ordered Health Maintenance  Topic Date Due   Zoster (Shingles) Vaccine (1 of 2) Never done   Flu Shot  02/19/2025*   COVID-19 Vaccine (7 - Pfizer risk 2024-25 season) 07/21/2026*   Medicare Annual Wellness Visit  06/27/2025   DTaP/Tdap/Td vaccine (4 - Tdap) 06/05/2032   Pneumococcal Vaccine for age over 28  Completed   DEXA scan (bone density measurement)  Completed   HPV Vaccine  Aged Out   Meningitis B Vaccine  Aged Out   Pneumococcal Vaccine  Discontinued   Colon Cancer Screening  Discontinued   Hepatitis C Screening  Discontinued  *Topic was postponed. The date shown is not the original due date.    Planning shingrix vaccine  Cologuard neg 06/2023  Dexa ordered Discussed fall prevention, supplements and exercise for bone density   PHQ 0  Follow up planned for new dm        Prediabetes   Lab Results  Component Value Date   HGBA1C 7.4 (H) 06/28/2024   HGBA1C 5.9 10/05/2023   HGBA1C 6.2 06/28/2023   Now in DM range Discussed low glycemic diet  Follow up planned to discuss and make treatment plan       Pedal edema   Hydrochlorothiazide  prn  Not needed  often Left ankle has always swollen more than right (does have old injury)       Hyperlipidemia   Disc goals for lipids and reasons to control them Rev last labs with pt Rev low sat fat diet in detail  LDL is up to 122  Cannot remember crestor  5 mg every other day - wants to try daily  Will report of any side effects and will watch LFTs       Current use of proton pump inhibitor   Taking omeprazole  Lab Results  Component Value Date   VITAMINB12 1,269 (H) 06/28/2024   Last vitamin D  Lab Results  Component Value Date   VD25OH 38.74 06/28/2024         Colon cancer screening   Cologuard negative 06/2023

## 2024-07-05 NOTE — Assessment & Plan Note (Signed)
 Continues omeprazole  20 mg daily prn

## 2024-07-05 NOTE — Assessment & Plan Note (Signed)
 bp in fair control at this time  BP Readings from Last 1 Encounters:  07/05/24 138/65   No changes needed Most recent labs reviewed  Disc lifstyle change with low sodium diet and exercise  Has not taken hydrochlorothiazide  today (is prn)  Follow up for DM (new)-will discuss arb or ace

## 2024-07-05 NOTE — Assessment & Plan Note (Signed)
 Vitamin D  level is therapeutic with current supplementation Disc importance of this to bone and overall health Last vitamin D  Lab Results  Component Value Date   VD25OH 38.74 06/28/2024

## 2024-07-05 NOTE — Assessment & Plan Note (Signed)
 Disc goals for lipids and reasons to control them Rev last labs with pt Rev low sat fat diet in detail  LDL is up to 122  Cannot remember crestor  5 mg every other day - wants to try daily  Will report of any side effects and will watch LFTs

## 2024-07-05 NOTE — Patient Instructions (Addendum)
 If you are interested in the new shingles vaccine (Shingrix) - call your local pharmacy to check on coverage and availability   Stay active  Try to add some strength training  Add some strength training to your routine, this is important for bone and brain health and can reduce your risk of falls and help your body use insulin properly and regulate weight  Light weights, exercise bands , and internet videos are a good way to start  Yoga (chair or regular), machines , floor exercises or a gym with machines are also good options   Your glucose is in the diabetic range  Avoid added sugars  Try to get most of your carbohydrates from produce (with the exception of white potatoes) and whole grains Eat less bread/pasta/rice/snack foods/cereals/sweets and other items from the middle of the grocery store (processed carbs)  Let's try taking your crestor  every day If not well tolerated - let us  know  We will watch liver tests   Follow up in the next month to discuss diabetes and also cholesterol    You have an order for:  []   3D Mammogram  [x]   Bone Density     Please call for appointment:   []   [facility]  [address] [place] [address]  [phone]  []   [facility]   [address]  [name], [place] [address]  [phone]  []   [facility]      [address] [place], [place]        [phone]         []   [facility]  [address] [place], [place]  [phone]  [x]  [facility]   [address]   [place], [place] [address]  [phone]  []  [facility]  [address] [place], [place] [address] [phone]    Make sure to wear two piece clothing  No lotions powders or deodorants the day of the appointment Make  sure to bring picture ID and insurance card.  Bring list of medications you are currently taking including any supplements.   Schedule your screening mammogram through MyChart!   Select [place] imaging sites can now be scheduled through MyChart.  Log into your MyChart account.  Go to 'Visit' (or 'Appointments' if  on mobile App) --> Schedule an  Appointment  Under 'Select a Reason for Visit' choose the Mammogram  Screening option.  Complete the pre-visit questions  and select the time and place that  best fits your schedule

## 2024-07-05 NOTE — Assessment & Plan Note (Signed)
 Dexa ordered  No falls or fracture On prolia   Encouraged more strength building exercise   Discussed fall prevention, supplements and exercise for bone density

## 2024-07-05 NOTE — Assessment & Plan Note (Signed)
 Reviewed health habits including diet and exercise and skin cancer prevention Reviewed appropriate screening tests for age  Also reviewed health mt list, fam hx and immunization status , as well as social and family history   See HPI Labs reviewed and ordered Health Maintenance  Topic Date Due   Zoster (Shingles) Vaccine (1 of 2) Never done   Flu Shot  02/19/2025*   COVID-19 Vaccine (7 - Pfizer risk 2024-25 season) 07/21/2026*   Medicare Annual Wellness Visit  06/27/2025   DTaP/Tdap/Td vaccine (4 - Tdap) 06/05/2032   Pneumococcal Vaccine for age over 43  Completed   DEXA scan (bone density measurement)  Completed   HPV Vaccine  Aged Out   Meningitis B Vaccine  Aged Out   Pneumococcal Vaccine  Discontinued   Colon Cancer Screening  Discontinued   Hepatitis C Screening  Discontinued  *Topic was postponed. The date shown is not the original due date.    Planning shingrix vaccine  Cologuard neg 06/2023  Dexa ordered Discussed fall prevention, supplements and exercise for bone density   PHQ 0  Follow up planned for new dm

## 2024-07-05 NOTE — Assessment & Plan Note (Signed)
 Lab Results  Component Value Date   ALT 26 06/28/2024   AST 21 06/28/2024   ALKPHOS 59 06/28/2024   BILITOT 0.7 06/28/2024   Encouraged to keep working on healthy diet and exercise

## 2024-07-05 NOTE — Assessment & Plan Note (Signed)
 Taking omeprazole  Lab Results  Component Value Date   VITAMINB12 1,269 (H) 06/28/2024   Last vitamin D  Lab Results  Component Value Date   VD25OH 38.74 06/28/2024

## 2024-07-05 NOTE — Assessment & Plan Note (Signed)
 Lab Results  Component Value Date   HGBA1C 7.4 (H) 06/28/2024   HGBA1C 5.9 10/05/2023   HGBA1C 6.2 06/28/2023   Now in DM range Discussed low glycemic diet  Follow up planned to discuss and make treatment plan

## 2024-07-06 ENCOUNTER — Ambulatory Visit: Payer: Self-pay | Admitting: Family Medicine

## 2024-07-06 ENCOUNTER — Other Ambulatory Visit: Payer: Self-pay | Admitting: Family Medicine

## 2024-07-09 MED ORDER — HYDROCHLOROTHIAZIDE 12.5 MG PO CAPS: 12.5000 mg | ORAL_CAPSULE | Freq: Every day | ORAL | 0 refills | Status: AC | PRN

## 2024-07-09 NOTE — Addendum Note (Signed)
 Addended by: RAYLEEN GLENDORA HERO on: 07/09/2024 08:36 AM   Modules accepted: Orders

## 2024-07-17 ENCOUNTER — Other Ambulatory Visit (HOSPITAL_COMMUNITY): Payer: Self-pay

## 2024-07-17 ENCOUNTER — Telehealth: Payer: Self-pay

## 2024-07-17 NOTE — Telephone Encounter (Signed)
 SABRA

## 2024-07-17 NOTE — Telephone Encounter (Signed)
 Prolia VOB initiated via [email or web address]  Next Prolia inj DUE: 08/17/24

## 2024-07-17 NOTE — Telephone Encounter (Signed)
 Pt ready for scheduling for PROLIA  on or after : 08/17/24  Option# 1: Buy/Bill (Office supplied medication)  Out-of-pocket cost due at time of clinic visit: $0  Number of injection/visits approved: ---  Primary: MEDICARE Prolia  co-insurance: 0% Admin fee co-insurance: 0%  Secondary: UHC AARP-MEDSUP Prolia  co-insurance:  Admin fee co-insurance:   Medical Benefit Details: Date Benefits were checked: 07/17/24 Deductible: $257 Met of $257 Required/ Coinsurance: 0%/ Admin Fee: 0%  Prior Auth: N/A PA# Expiration Date:   # of doses approved: ----------------------------------------------------------------------- Option# 2- Med Obtained from pharmacy:  Pharmacy benefit: Copay $960.88 (Paid to pharmacy) Admin Fee: 0% (Pay at clinic)  Prior Auth: N/A PA# Expiration Date:   # of doses approved:   If patient wants fill through the pharmacy benefit please send prescription to: [facility], and include estimated need by date in rx notes. Pharmacy will ship medication directly to the office.  Patient NOT eligible for Prolia  Copay Card. Copay Card can make patient's cost as little as $25. Link to apply: [email or web address]://[email or web address]  ** This summary of benefits is an estimation of the patient's out-of-pocket cost. Exact cost may very based on individual plan coverage.

## 2024-08-06 ENCOUNTER — Ambulatory Visit: Admitting: Family Medicine

## 2024-08-10 ENCOUNTER — Encounter: Payer: Self-pay | Admitting: Family Medicine

## 2024-08-10 ENCOUNTER — Ambulatory Visit: Payer: Self-pay | Admitting: Family Medicine

## 2024-08-10 ENCOUNTER — Ambulatory Visit (INDEPENDENT_AMBULATORY_CARE_PROVIDER_SITE_OTHER): Payer: PRIVATE HEALTH INSURANCE | Admitting: Family Medicine

## 2024-08-10 VITALS — BP 131/69 | HR 90 | Temp 98.3°F | Ht 60.5 in | Wt 141.5 lb

## 2024-08-10 DIAGNOSIS — E785 Hyperlipidemia, unspecified: Secondary | ICD-10-CM

## 2024-08-10 DIAGNOSIS — E119 Type 2 diabetes mellitus without complications: Secondary | ICD-10-CM

## 2024-08-10 DIAGNOSIS — M810 Age-related osteoporosis without current pathological fracture: Secondary | ICD-10-CM | POA: Diagnosis not present

## 2024-08-10 DIAGNOSIS — E1169 Type 2 diabetes mellitus with other specified complication: Secondary | ICD-10-CM

## 2024-08-10 DIAGNOSIS — R300 Dysuria: Secondary | ICD-10-CM

## 2024-08-10 DIAGNOSIS — I10 Essential (primary) hypertension: Secondary | ICD-10-CM | POA: Diagnosis not present

## 2024-08-10 DIAGNOSIS — R8290 Unspecified abnormal findings in urine: Secondary | ICD-10-CM | POA: Diagnosis not present

## 2024-08-10 LAB — POC URINALSYSI DIPSTICK (AUTOMATED)
Bilirubin, UA: NEGATIVE
Blood, UA: NEGATIVE
Glucose, UA: NEGATIVE
Ketones, UA: 40 — AB
Nitrite, UA: NEGATIVE
Protein, UA: NEGATIVE
Spec Grav, UA: >=1.03 — AB (ref 1.010–1.025)
Urobilinogen, UA: 0.2 U/dL
pH, UA: 5.5 (ref 5.0–8.0)

## 2024-08-10 MED ORDER — CEPHALEXIN 500 MG PO CAPS: 500.0000 mg | ORAL_CAPSULE | Freq: Two times a day (BID) | ORAL | 0 refills | Status: AC

## 2024-08-10 MED ORDER — LOSARTAN POTASSIUM 25 MG PO TABS: 25.0000 mg | ORAL_TABLET | Freq: Every day | ORAL | 1 refills | Status: AC

## 2024-08-10 NOTE — Assessment & Plan Note (Signed)
 Urinalysis positive Culture pending  Treatment with keflex    Call back and Er precautions noted in detail today  call

## 2024-08-10 NOTE — Assessment & Plan Note (Signed)
 Lab Results  Component Value Date   HGBA1C 7.4 (H) 06/28/2024   HGBA1C 5.9 10/05/2023   HGBA1C 6.2 06/28/2023   Working on diet/exercise and lost 5 lb  Prefers not to start metforimin and will follow up at 3 month mark  Normal foot exam Ref done for dm eye exam Started arb  On statin  Microalb ordered

## 2024-08-10 NOTE — Assessment & Plan Note (Signed)
 Disc goals for lipids and reasons to control them Rev last labs with pt Rev low sat fat diet in detail Getting back on track with crestor  every other day   Re check in nov as planned New goal LDL 70 or less

## 2024-08-10 NOTE — Progress Notes (Signed)
 Subjective:    Patient ID: Brianna [name], female    DOB: 03/11/1946, 78 y.o.   MRN: [id number]  HPI  Wt Readings from Last 3 Encounters:  08/10/24 141 lb 8 oz (64.2 kg)  07/05/24 146 lb 4 oz (66.3 kg)  06/27/24 144 lb (65.3 kg)   27.18 kg/m  Vitals:   08/10/24 1524 08/10/24 1557  BP: (!) 142/60 131/69  Pulse: 90   Temp: 98.3 F (36.8 C)   SpO2: 96%     Pt presents for follow up of  DM2 Hyperlipidemia HTN Uti symptoms   Uti symptoms  Burning to urinate  On and off for a month  No blood in urine  No fever No nausea or vomiting    HTN bp is stable today  No cp or palpitations or headaches or edema  No side effects to medicines  BP Readings from Last 3 Encounters:  08/10/24 131/69  07/05/24 138/65  01/30/24 (!) 145/79    Takes hydrochlorothiazide  prn for edema   Lab Results  Component Value Date   NA 142 06/28/2024   K 4.6 06/28/2024   CO2 29 06/28/2024   GLUCOSE 175 (H) 06/28/2024   BUN 12 06/28/2024   CREATININE 0.82 06/28/2024   CALCIUM  9.2 06/28/2024   GFR 67.70 06/28/2024   EGFR 59 (L) 12/18/2021   GFRNONAA >60 12/03/2010     Diabetes Last visit A1c was elevated  Discussed dietary/exercise change    DM diet  Completely cut out sugar - sweets/soda/ice cream  Gave up her soda as well -drinking lots of water  Lost 5 lb   Eating less processed carbs Less white potatoes   Loves green vegetables   Exercise - planning to start strength training  Is up walking due to her job   Tired from work currently  Wants to keep working   Lab Results  Component Value Date   HGBA1C 7.4 (H) 06/28/2024   HGBA1C 5.9 10/05/2023   HGBA1C 6.2 06/28/2023   No results found for: LABMICR, MICROALBUR  Renal protection-none yet  Last eye exam about 2 years ago      Hyperlipidemia Lab Results  Component Value Date   CHOL 195 06/28/2024   HDL 44.70 06/28/2024   LDLCALC 122 (H) 06/28/2024   LDLDIRECT 154.0 05/03/2019   TRIG 140.0  06/28/2024   CHOLHDL 4 06/28/2024   Was taking crestor  every other day -forgot to do that  Hard time remembering that  Wanted to try daily instead and see if tolerated     Lab Results  Component Value Date   ALT 26 06/28/2024   AST 21 06/28/2024   ALKPHOS 59 06/28/2024   BILITOT 0.7 06/28/2024     Urinalysis today  Results for orders placed or performed in visit on 08/10/24  POCT Urinalysis Dipstick (Automated)   Collection Time: 08/10/24  4:16 PM  Result Value Ref Range   Color, UA Dark Yellow    Clarity, UA Cloudy    Glucose, UA Negative Negative   Bilirubin, UA Negative    Ketones, UA 40 mg/dL (A)    Spec Grav, UA >=8.969 (A) 1.010 - 1.025   Blood, UA Negative    pH, UA 5.5 5.0 - 8.0   Protein, UA Negative Negative   Urobilinogen, UA 0.2 0.2 or 1.0 E.U./dL   Nitrite, UA Negative    Leukocytes, UA Large (3+) (A) Negative      Patient Active Problem List   Diagnosis Date Noted  Dysuria 08/10/2024   Colon cancer screening 07/05/2023   Current use of proton pump inhibitor 06/14/2021   Hypertension, essential 06/05/2020   Plantar fasciitis 06/19/2018   Low back pain 06/19/2018   Routine general medical examination at a health care facility 05/10/2018   Chronic venous insufficiency 09/10/2016   Pedal edema 08/13/2016   New onset type 2 diabetes mellitus (HCC) 03/21/2015   Nocturnal leg cramps 03/21/2015   History of breast cancer 12/26/2014   Encounter for Medicare annual wellness exam 02/28/2014   Vitamin D  deficiency 10/11/2008   Hyperlipidemia associated with type 2 diabetes mellitus (HCC) 07/07/2007   GERD 07/07/2007   Osteoporosis 07/07/2007   Fatty liver 07/07/2007   Past Medical History:  Diagnosis Date   Allergic rhinitis    Breast carcinoma (HCC) 08/22/2010   right- eventual double mastectomy in 2012   Cataract    GERD (gastroesophageal reflux disease)    Hyperlipidemia    Osteoporosis    fosamax  started 9/08   Shingles    Vitamin D   deficiency    Past Surgical History:  Procedure Laterality Date   ABDOMINAL HYSTERECTOMY     BREAST BIOPSY  08/22/2010   invasive and in situ mam carcinoma   BREAST LUMPECTOMY  09/22/2010   CATARACT EXTRACTION     does not remember when   MASTECTOMY  11/22/2010   double- Dr. Ebbie   MASTECTOMY, RADICAL Bilateral 11/22/2010   PARTIAL HYSTERECTOMY  07/24/1979   non cancer, ovaries intact   WRIST FRACTURE SURGERY Left    in the 1990's   Social History   Tobacco Use   Smoking status: Never   Smokeless tobacco: Never  Vaping Use   Vaping status: Never Used  Substance Use Topics   Alcohol use: Not Currently    Comment: wine rarely   Drug use: No   Family History  Problem Relation Age of Onset   Dementia Mother    Cancer Father        esophageal   Diabetes Brother    Cancer Paternal Aunt        ovarian   Allergies  Allergen Reactions   Pravachol      Myalgias  (As of visit on 05/17/2013, patient is taking Pravachol , but continues to have muscle pain)   Zetia [Ezetimibe] Other (See Comments)    Ineffective, elevated liver enzymes   Current Outpatient Medications on File Prior to Visit  Medication Sig Dispense Refill   Cholecalciferol (VITAMIN D -3) 125 MCG (5000 UT) TABS Take by mouth.     Cholecalciferol (VITAMIN D3 PO) Take 250 mcg by mouth daily.     Cyanocobalamin  (VITAMIN B-12) 5000 MCG TBDP Take by mouth.     fexofenadine (ALLEGRA) 180 MG tablet Take otc as directed     hydrochlorothiazide  (MICROZIDE ) 12.5 MG capsule Take 1 capsule (12.5 mg total) by mouth daily as needed (leg swelling). 90 capsule 0   omeprazole  (PRILOSEC) 20 MG capsule TAKE 1 CAPSULE BY MOUTH DAILY AS NEEDED. 90 capsule 0   rosuvastatin  (CRESTOR ) 5 MG tablet Take 1 tablet (5 mg total) by mouth every other day. 45 tablet 3   Current Facility-Administered Medications on File Prior to Visit  Medication Dose Route Frequency Provider Last Rate Last Admin   [START ON 08/17/2024] denosumab  (PROLIA )  injection 60 mg  60 mg Subcutaneous Once Jerrian Mells, Laine LABOR, MD        Review of Systems  Constitutional:  Positive for fatigue. Negative for activity change, appetite change, fever and  unexpected weight change.  HENT:  Negative for congestion, ear pain, rhinorrhea, sinus pressure and sore throat.   Eyes:  Negative for pain, redness and visual disturbance.  Respiratory:  Negative for cough, shortness of breath and wheezing.   Cardiovascular:  Negative for chest pain and palpitations.  Gastrointestinal:  Negative for abdominal pain, blood in stool, constipation and diarrhea.  Endocrine: Negative for polydipsia and polyuria.  Genitourinary:  Positive for dysuria, frequency and urgency.  Musculoskeletal:  Negative for arthralgias, back pain and myalgias.  Skin:  Negative for pallor and rash.  Allergic/Immunologic: Negative for environmental allergies.  Neurological:  Negative for dizziness, syncope and headaches.  Hematological:  Negative for adenopathy. Does not bruise/bleed easily.  Psychiatric/Behavioral:  Negative for decreased concentration and dysphoric mood. The patient is not nervous/anxious.        Objective:   Physical Exam Constitutional:      General: She is not in acute distress.    Appearance: Normal appearance. She is well-developed and normal weight. She is not ill-appearing or diaphoretic.  HENT:     Head: Normocephalic and atraumatic.  Eyes:     Conjunctiva/sclera: Conjunctivae normal.     Pupils: Pupils are equal, round, and reactive to light.  Neck:     Thyroid : No thyromegaly.     Vascular: No carotid bruit or JVD.  Cardiovascular:     Rate and Rhythm: Normal rate and regular rhythm.     Heart sounds: Normal heart sounds.     No gallop.  Pulmonary:     Effort: Pulmonary effort is normal. No respiratory distress.     Breath sounds: Normal breath sounds. No wheezing or rales.  Abdominal:     General: There is no distension or abdominal bruit.     Palpations:  Abdomen is soft.  Musculoskeletal:     Cervical back: Normal range of motion and neck supple.     Right lower leg: No edema.     Left lower leg: No edema.  Lymphadenopathy:     Cervical: No cervical adenopathy.  Skin:    General: Skin is warm and dry.     Coloration: Skin is not pale.     Findings: No rash.  Neurological:     Mental Status: She is alert.     Coordination: Coordination normal.     Deep Tendon Reflexes: Reflexes are normal and symmetric. Reflexes normal.  Psychiatric:        Mood and Affect: Mood normal.           Assessment & Plan:   Problem List Items Addressed This Visit       Cardiovascular and Mediastinum   Hypertension, essential   bp in fair control at this time but not at goal for DM2  BP Readings from Last 1 Encounters:  08/10/24 131/69   Starting losartan  25 mg daily for this and renal protection   Most recent labs reviewed  Disc lifstyle change with low sodium diet and exercise  Has not taken hydrochlorothiazide  today (is prn)   Lab and follow up early nov        Relevant Medications   losartan  (COZAAR ) 25 MG tablet   Other Relevant Orders   Basic Metabolic Panel     Endocrine   New onset type 2 diabetes mellitus (HCC) - Primary   Lab Results  Component Value Date   HGBA1C 7.4 (H) 06/28/2024   HGBA1C 5.9 10/05/2023   HGBA1C 6.2 06/28/2023   Working on diet/exercise and  lost 5 lb  Prefers not to start metforimin and will follow up at 3 month mark  Normal foot exam Ref done for dm eye exam Started arb  On statin  Microalb ordered        Relevant Medications   losartan  (COZAAR ) 25 MG tablet   Other Relevant Orders   Microalbumin / creatinine urine ratio   Ambulatory referral to Optometry   Hyperlipidemia associated with type 2 diabetes mellitus (HCC)   Disc goals for lipids and reasons to control them Rev last labs with pt Rev low sat fat diet in detail Getting back on track with crestor  every other day   Re check  in nov as planned New goal LDL 70 or less      Relevant Medications   losartan  (COZAAR ) 25 MG tablet     Musculoskeletal and Integument   Osteoporosis   Bmet today for upcoming prolia  inj  Reviewed last dexa- mixed trend No falls or fractures       Relevant Orders   Basic Metabolic Panel     Other   Dysuria   Urinalysis positive Culture pending  Treatment with keflex    Call back and Er precautions noted in detail today  call         Relevant Orders   POCT Urinalysis Dipstick (Automated) (Completed)   Urine Culture   Other Visit Diagnoses       Abnormal urinalysis       Relevant Orders   Urine Culture

## 2024-08-10 NOTE — Assessment & Plan Note (Signed)
 Bmet today for upcoming prolia  inj  Reviewed last dexa- mixed trend No falls or fractures

## 2024-08-10 NOTE — Patient Instructions (Addendum)
 Schedule fasting labs after 11/725 to check cholesterol and blood sugar and then follow up   I put the referral in for diabetic eye exam  Please let us  know if you don't hear in 1-2 weeks to set that up (mychart message or call or letter)    Urine protein test today Lab for prolia  today  Also a plan urinalysis for possible uti  We will reach out with results  Keep drinking water   Start losartan  25 mg once daily  If any side effects let us  know

## 2024-08-10 NOTE — Assessment & Plan Note (Signed)
 bp in fair control at this time but not at goal for DM2  BP Readings from Last 1 Encounters:  08/10/24 131/69   Starting losartan  25 mg daily for this and renal protection   Most recent labs reviewed  Disc lifstyle change with low sodium diet and exercise  Has not taken hydrochlorothiazide  today (is prn)   Lab and follow up early nov

## 2024-08-11 LAB — MICROALBUMIN / CREATININE URINE RATIO
Creatinine, Urine: 120 mg/dL (ref 20–275)
Microalb Creat Ratio: 13 mg/g{creat} (ref <30)
Microalb, Ur: 1.5 mg/dL

## 2024-08-11 LAB — BASIC METABOLIC PANEL WITH GFR
BUN/Creatinine Ratio: 20 (calc) (ref 6–22)
BUN: 19 mg/dL (ref 7–25)
CO2: 25 mmol/L (ref 20–32)
Calcium: 9.3 mg/dL (ref 8.6–10.4)
Chloride: 101 mmol/L (ref 98–110)
Creat: 0.97 mg/dL — ABNORMAL HIGH (ref 0.60–0.95)
Glucose, Bld: 127 mg/dL — ABNORMAL HIGH (ref 65–99)
Potassium: 4.3 mmol/L (ref 3.5–5.3)
Sodium: 138 mmol/L (ref 135–146)
eGFR: 59 mL/min/1.73m2 — ABNORMAL LOW (ref >=60)

## 2024-08-11 LAB — URINE CULTURE
MICRO NUMBER:: 16992079
SPECIMEN QUALITY:: ADEQUATE

## 2024-08-21 ENCOUNTER — Ambulatory Visit

## 2024-08-22 ENCOUNTER — Ambulatory Visit (INDEPENDENT_AMBULATORY_CARE_PROVIDER_SITE_OTHER)

## 2024-08-22 DIAGNOSIS — M810 Age-related osteoporosis without current pathological fracture: Secondary | ICD-10-CM

## 2024-08-22 MED ORDER — DENOSUMAB 60 MG/ML ~~LOC~~ SOSY: 60.0000 mg | PREFILLED_SYRINGE | SUBCUTANEOUS | Status: AC

## 2024-08-22 NOTE — Progress Notes (Signed)
 Per orders of Dr. Laine Balls, injection of Prolia   given by Clotilda SHAUNNA Pander in left arm. Patient tolerated injection well.

## 2024-10-01 ENCOUNTER — Other Ambulatory Visit: Payer: Self-pay | Admitting: Family Medicine

## 2024-10-01 ENCOUNTER — Encounter: Payer: Self-pay | Admitting: *Deleted

## 2024-10-12 DIAGNOSIS — Z9841 Cataract extraction status, right eye: Secondary | ICD-10-CM | POA: Diagnosis not present

## 2024-10-12 DIAGNOSIS — H52213 Irregular astigmatism, bilateral: Secondary | ICD-10-CM | POA: Diagnosis not present

## 2024-10-12 DIAGNOSIS — Z9842 Cataract extraction status, left eye: Secondary | ICD-10-CM | POA: Diagnosis not present

## 2024-10-12 DIAGNOSIS — H18523 Epithelial (juvenile) corneal dystrophy, bilateral: Secondary | ICD-10-CM | POA: Diagnosis not present

## 2024-10-12 DIAGNOSIS — E119 Type 2 diabetes mellitus without complications: Secondary | ICD-10-CM | POA: Diagnosis not present

## 2024-10-12 LAB — OPHTHALMOLOGY REPORT-SCANNED

## 2024-11-06 ENCOUNTER — Encounter: Payer: Self-pay | Admitting: Family Medicine

## 2024-12-01 ENCOUNTER — Other Ambulatory Visit: Payer: Self-pay | Admitting: Family Medicine

## 2025-06-28 ENCOUNTER — Ambulatory Visit
